# Patient Record
Sex: Male | Born: 1950 | Race: Black or African American | Hispanic: No | Marital: Married | Smoking: Never smoker
Health system: Southern US, Community
[De-identification: ages and names within clinical notes are randomized; demographics above are authoritative.]

## PROBLEM LIST (undated history)

## (undated) DIAGNOSIS — T148XXA Other injury of unspecified body region, initial encounter: Secondary | ICD-10-CM

## (undated) DIAGNOSIS — R519 Headache, unspecified: Secondary | ICD-10-CM

## (undated) DIAGNOSIS — Z8546 Personal history of malignant neoplasm of prostate: Secondary | ICD-10-CM

## (undated) DIAGNOSIS — Z860100 Personal history of colon polyps, unspecified: Secondary | ICD-10-CM

## (undated) DIAGNOSIS — J189 Pneumonia, unspecified organism: Secondary | ICD-10-CM

## (undated) DIAGNOSIS — R0600 Dyspnea, unspecified: Secondary | ICD-10-CM

## (undated) DIAGNOSIS — N529 Male erectile dysfunction, unspecified: Secondary | ICD-10-CM

## (undated) DIAGNOSIS — C801 Malignant (primary) neoplasm, unspecified: Secondary | ICD-10-CM

## (undated) DIAGNOSIS — R011 Cardiac murmur, unspecified: Secondary | ICD-10-CM

## (undated) DIAGNOSIS — H269 Unspecified cataract: Secondary | ICD-10-CM

## (undated) HISTORY — DX: Malignant (primary) neoplasm, unspecified: C80.1

## (undated) HISTORY — DX: Unspecified cataract: H26.9

## (undated) HISTORY — DX: Personal history of malignant neoplasm of prostate: Z85.46

## (undated) HISTORY — DX: Other injury of unspecified body region, initial encounter: T14.8XXA

## (undated) HISTORY — DX: Personal history of colonic polyps: Z86.010

## (undated) HISTORY — DX: Male erectile dysfunction, unspecified: N52.9

## (undated) HISTORY — DX: Personal history of colon polyps, unspecified: Z86.0100

## (undated) HISTORY — PX: RETINAL TEAR REPAIR CRYOTHERAPY: SHX5304

## (undated) HISTORY — DX: Cardiac murmur, unspecified: R01.1

## (undated) HISTORY — PX: CATARACT EXTRACTION W/ INTRAOCULAR LENS IMPLANT: SHX1309

---
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
## 1994-01-06 HISTORY — PX: PROSTATECTOMY: SHX69

## 1997-01-06 HISTORY — PX: HEMORRHOID SURGERY: SHX153

## 1999-01-07 HISTORY — PX: ANKLE FRACTURE SURGERY: SHX122

## 2003-01-07 HISTORY — PX: PROSTATECTOMY: SHX69

## 2007-03-09 LAB — HM COLONOSCOPY

## 2007-06-30 ENCOUNTER — Encounter: Payer: Self-pay | Admitting: Family Medicine

## 2008-04-10 ENCOUNTER — Ambulatory Visit: Payer: Self-pay | Admitting: Family Medicine

## 2008-04-10 DIAGNOSIS — F528 Other sexual dysfunction not due to a substance or known physiological condition: Secondary | ICD-10-CM | POA: Insufficient documentation

## 2008-04-10 DIAGNOSIS — L909 Atrophic disorder of skin, unspecified: Secondary | ICD-10-CM | POA: Insufficient documentation

## 2008-04-10 DIAGNOSIS — Z860100 Personal history of colon polyps, unspecified: Secondary | ICD-10-CM | POA: Insufficient documentation

## 2008-04-10 DIAGNOSIS — Z8546 Personal history of malignant neoplasm of prostate: Secondary | ICD-10-CM | POA: Insufficient documentation

## 2008-04-10 DIAGNOSIS — J45909 Unspecified asthma, uncomplicated: Secondary | ICD-10-CM | POA: Insufficient documentation

## 2008-04-10 DIAGNOSIS — M659 Synovitis and tenosynovitis, unspecified: Secondary | ICD-10-CM

## 2008-04-10 DIAGNOSIS — L919 Hypertrophic disorder of the skin, unspecified: Secondary | ICD-10-CM

## 2008-07-03 ENCOUNTER — Ambulatory Visit: Payer: Self-pay | Admitting: Family Medicine

## 2008-07-03 DIAGNOSIS — M109 Gout, unspecified: Secondary | ICD-10-CM | POA: Insufficient documentation

## 2008-07-06 LAB — CONVERTED CEMR LAB: Uric Acid, Serum: 5.2 mg/dL (ref 4.0–7.8)

## 2009-04-12 ENCOUNTER — Ambulatory Visit: Payer: Self-pay | Admitting: Family Medicine

## 2009-04-12 LAB — CONVERTED CEMR LAB
Bilirubin Urine: NEGATIVE
Blood in Urine, dipstick: NEGATIVE
Glucose, Urine, Semiquant: NEGATIVE
Ketones, urine, test strip: NEGATIVE
Nitrite: NEGATIVE
Protein, U semiquant: NEGATIVE
Specific Gravity, Urine: 1.01
Urobilinogen, UA: 0.2
WBC Urine, dipstick: NEGATIVE
pH: 6

## 2009-04-17 LAB — CONVERTED CEMR LAB
Alkaline Phosphatase: 70 units/L (ref 39–117)
Basophils Absolute: 0 10*3/uL (ref 0.0–0.1)
Basophils Relative: 0.6 % (ref 0.0–3.0)
Bilirubin, Direct: 0 mg/dL (ref 0.0–0.3)
CO2: 30 meq/L (ref 19–32)
Calcium: 9.3 mg/dL (ref 8.4–10.5)
Creatinine, Ser: 0.8 mg/dL (ref 0.4–1.5)
Eosinophils Absolute: 0.1 10*3/uL (ref 0.0–0.7)
GFR calc non Af Amer: 105.29 mL/min (ref >60)
HDL: 66.4 mg/dL (ref >39.00)
Lymphocytes Relative: 58 % — ABNORMAL HIGH (ref 12.0–46.0)
MCHC: 34.8 g/dL (ref 30.0–36.0)
Monocytes Relative: 10.7 % (ref 3.0–12.0)
Neutrophils Relative %: 28 % — ABNORMAL LOW (ref 43.0–77.0)
RBC: 4.7 M/uL (ref 4.22–5.81)
RDW: 13.8 % (ref 11.5–14.6)
Total CHOL/HDL Ratio: 3
Triglycerides: 45 mg/dL (ref 0.0–149.0)
VLDL: 9 mg/dL (ref 0.0–40.0)

## 2009-04-19 ENCOUNTER — Ambulatory Visit: Payer: Self-pay | Admitting: Family Medicine

## 2009-11-17 ENCOUNTER — Encounter: Payer: Self-pay | Admitting: Family Medicine

## 2009-11-19 ENCOUNTER — Encounter: Payer: Self-pay | Admitting: Family Medicine

## 2010-02-05 NOTE — Miscellaneous (Signed)
Summary: Flu Shot/[facility]  Flu Shot/[facility]   Imported By: Maryln Gottron 11/21/2009 15:39:25  _____________________________________________________________________  External Attachment:    Type:   Image     Comment:   External Document

## 2010-02-05 NOTE — Assessment & Plan Note (Signed)
Summary: CPX/[facility]   Vital Signs:  Patient profile:   60 year old [name] Height:      68 inches Weight:      186 pounds BMI:     28.38 BP sitting:   116 / 84  (left arm) Cuff size:   regular  Vitals Entered By: Raechel Ache, RN (April 19, 2009 9:23 AM) CC: CPX, labs done. Hemorrhoids worse. C/o sore L rib. Toe and foot fungus. Is Patient Diabetic? No   History of Present Illness: 60 yr old [name] for a cpx. He feels fine in general but does ask me to look at his feet. He has a long hx of toenail fungus, and he wants to treat this. The nails dig into his toes and cause pain. He saw Dr. Brunilda Payor for a prostate check last September.   Allergies: 1)  ! Cipro  Past History:  Past Medical History: Asthma Colonic polyps, hx of Prostate cancer, hx of. Sees Dr. Brunilda Payor fractured right ankle ED hemorrhoids  Past Surgical History: Rt bunion removed 1965 Prostatectomy 2005 colonoscopy 2009 in [place], several polyps were removed. repeat in 3 years Hemorrhoidectomy  Family History: Reviewed history from 04/10/2008 and no changes required. Family History of Alcoholism/Addiction Family History of Arthritis Family History Breast cancer 1st degree relative <50 Family History of Colon CA 1st degree relative <60 Family History Ovarian cancer Family History of Prostate CA 1st degree relative <50 Family History of Stroke M 1st degree relative <50  Social History: Reviewed history from 04/10/2008 and no changes required. Married Never Smoked Alcohol use-yes Drug use-no Regular exercise-yes  Review of Systems  The patient denies anorexia, fever, weight loss, weight gain, vision loss, decreased hearing, hoarseness, chest pain, syncope, dyspnea on exertion, peripheral edema, prolonged cough, headaches, hemoptysis, abdominal pain, melena, hematochezia, severe indigestion/heartburn, hematuria, incontinence, genital sores, muscle weakness, suspicious skin lesions, transient blindness, difficulty  walking, depression, unusual weight change, abnormal bleeding, enlarged lymph nodes, angioedema, breast masses, and testicular masses.    Physical Exam  General:  Well-developed,well-nourished,in no acute distress; alert,appropriate and cooperative throughout examination Head:  Normocephalic and atraumatic without obvious abnormalities. No apparent alopecia or balding. Eyes:  No corneal or conjunctival inflammation noted. EOMI. Perrla. Funduscopic exam benign, without hemorrhages, exudates or papilledema. Vision grossly normal. Ears:  External ear exam shows no significant lesions or deformities.  Otoscopic examination reveals clear canals, tympanic membranes are intact bilaterally without bulging, retraction, inflammation or discharge. Hearing is grossly normal bilaterally. Nose:  External nasal examination shows no deformity or inflammation. Nasal mucosa are pink and moist without lesions or exudates. Mouth:  Oral mucosa and oropharynx without lesions or exudates.  Teeth in good repair. Neck:  No deformities, masses, or tenderness noted. Chest Wall:  No deformities, masses, tenderness or gynecomastia noted. Lungs:  Normal respiratory effort, chest expands symmetrically. Lungs are clear to auscultation, no crackles or wheezes. Heart:  Normal rate and regular rhythm. S1 and S2 normal without gallop, murmur, click, rub or other extra sounds. EKG normal Abdomen:  Bowel sounds positive,abdomen soft and non-tender without masses, organomegaly or hernias noted. Msk:  No deformity or scoliosis noted of thoracic or lumbar spine.   Pulses:  R and L carotid,radial,femoral,dorsalis pedis and posterior tibial pulses are full and equal bilaterally Extremities:  No clubbing, cyanosis, edema, or deformity noted with normal full range of motion of all joints.   Neurologic:  No cranial nerve deficits noted. Station and gait are normal. Plantar reflexes are down-going bilaterally. DTRs are  symmetrical throughout.  Sensory, motor and coordinative functions appear intact. Skin:  Intact without suspicious lesions or rashes. Most of his toenails are thick and scaly Cervical Nodes:  No lymphadenopathy noted Axillary Nodes:  No palpable lymphadenopathy Inguinal Nodes:  No significant adenopathy Psych:  Cognition and judgment appear intact. Alert and cooperative with normal attention span and concentration. No apparent delusions, illusions, hallucinations   Impression & Recommendations:  Problem # 1:  HEALTH MAINTENANCE EXAM (ICD-V70.0)  Orders: EKG w/ Interpretation (93000)  Complete Medication List: 1)  Aspirin 81 Mg Tbec (Aspirin) .... One by mouth every day 2)  Vitamin B Complex-c Caps (B complex-c) .... Once daily 3)  Vitamin D 1000 Unit Tabs (Cholecalciferol) .... Once daily 4)  Vitamin E 600 Unit Caps (Vitamin e) .... Once daily 5)  Vitamin C 500 Mg Tabs (Ascorbic acid) .... Once daily 6)  Androgel Pump 1 % Gel (Testosterone) .Marland Kitchen.. 10g every day 7)  Lac-hydrin 12 % Crea (Ammonium lactate) .... Apply once daily 8)  Viagra 100 Mg Tabs (Sildenafil citrate) .... As needed 9)  Lamisil 250 Mg Tabs (Terbinafine hcl) .... Once daily  Patient Instructions: 1)  Please schedule a follow-up appointment in 1 year.  Prescriptions: LAMISIL 250 MG TABS (TERBINAFINE HCL) once daily  #90 x 0   Entered and Authorized by:   Nelwyn Salisbury MD   Signed by:   Nelwyn Salisbury MD on 04/19/2009   Method used:   Print then Give to Patient   RxID:   [id number] VIAGRA 100 MG TABS (SILDENAFIL CITRATE) as needed  #30 x 3   Entered and Authorized by:   Nelwyn Salisbury MD   Signed by:   Nelwyn Salisbury MD on 04/19/2009   Method used:   Print then Give to Patient   RxID:   [id number] LAC-HYDRIN 12 % CREA (AMMONIUM LACTATE) apply once daily  #90 x 3   Entered and Authorized by:   Nelwyn Salisbury MD   Signed by:   Nelwyn Salisbury MD on 04/19/2009   Method used:   Print then Give to Patient   RxID:    [id number]    Preventive Care Screening  Colonoscopy:    Date:  03/09/2007    Results:  Adenomatous Polyp

## 2010-02-05 NOTE — Miscellaneous (Signed)
Summary: flu inj given at [facility].   Clinical Lists Changes  Observations: Added new observation of FLU VAX: Historical (11/17/2009 10:58)      Immunization History:  Influenza Immunization History:    Influenza:  historical (11/17/2009) pt receivced flu inj a [facility] [address] lot number [id number]...................gh rn.

## 2010-06-19 ENCOUNTER — Other Ambulatory Visit: Payer: Self-pay | Admitting: Family Medicine

## 2010-09-23 ENCOUNTER — Other Ambulatory Visit (INDEPENDENT_AMBULATORY_CARE_PROVIDER_SITE_OTHER): Payer: BC Managed Care – PPO

## 2010-09-23 DIAGNOSIS — Z0000 Encounter for general adult medical examination without abnormal findings: Secondary | ICD-10-CM

## 2010-09-23 LAB — POCT URINALYSIS DIPSTICK
Ketones, UA: NEGATIVE
Leukocytes, UA: NEGATIVE
Nitrite, UA: NEGATIVE
Protein, UA: NEGATIVE
Urobilinogen, UA: 0.2

## 2010-09-23 LAB — CBC WITH DIFFERENTIAL/PLATELET
Basophils Absolute: 0 10*3/uL (ref 0.0–0.1)
Basophils Relative: 0.4 % (ref 0.0–3.0)
Hemoglobin: 14.5 g/dL (ref 13.0–17.0)
Lymphocytes Relative: 52.7 % — ABNORMAL HIGH (ref 12.0–46.0)
Monocytes Relative: 8.4 % (ref 3.0–12.0)
Neutro Abs: 1.2 10*3/uL — ABNORMAL LOW (ref 1.4–7.7)
RBC: 4.66 Mil/uL (ref 4.22–5.81)
WBC: 3.3 10*3/uL — ABNORMAL LOW (ref 4.5–10.5)

## 2010-09-23 LAB — LIPID PANEL
Cholesterol: 178 mg/dL (ref 0–200)
LDL Cholesterol: 106 mg/dL — ABNORMAL HIGH (ref 0–99)
Triglycerides: 41 mg/dL (ref 0.0–149.0)
VLDL: 8.2 mg/dL (ref 0.0–40.0)

## 2010-09-23 LAB — HEPATIC FUNCTION PANEL
Albumin: 4.2 g/dL (ref 3.5–5.2)
Total Protein: 6.9 g/dL (ref 6.0–8.3)

## 2010-09-23 LAB — BASIC METABOLIC PANEL
CO2: 29 mEq/L (ref 19–32)
Chloride: 104 mEq/L (ref 96–112)
Creatinine, Ser: 0.9 mg/dL (ref 0.4–1.5)

## 2010-09-25 NOTE — Progress Notes (Signed)
Quick Note:  Left a message for pt to return call. ______ 

## 2010-09-26 ENCOUNTER — Telehealth: Payer: Self-pay | Admitting: Family Medicine

## 2010-09-26 NOTE — Telephone Encounter (Signed)
Spoke with pt and gave results. 

## 2010-09-26 NOTE — Telephone Encounter (Signed)
Message copied by Baldemar Friday on Thu Sep 26, 2010  3:15 PM ------      Message from: Gershon Crane A      Created: Wed Sep 25, 2010  1:19 PM       normal

## 2010-09-27 ENCOUNTER — Encounter: Payer: Self-pay | Admitting: Family Medicine

## 2010-09-30 ENCOUNTER — Encounter: Payer: Self-pay | Admitting: Family Medicine

## 2010-09-30 ENCOUNTER — Ambulatory Visit (INDEPENDENT_AMBULATORY_CARE_PROVIDER_SITE_OTHER): Payer: BC Managed Care – PPO | Admitting: Family Medicine

## 2010-09-30 VITALS — BP 120/78 | HR 57 | Temp 98.4°F | Ht 69.25 in | Wt 186.0 lb

## 2010-09-30 DIAGNOSIS — Z0000 Encounter for general adult medical examination without abnormal findings: Secondary | ICD-10-CM

## 2010-09-30 DIAGNOSIS — K635 Polyp of colon: Secondary | ICD-10-CM

## 2010-09-30 DIAGNOSIS — D126 Benign neoplasm of colon, unspecified: Secondary | ICD-10-CM

## 2010-09-30 MED ORDER — TERBINAFINE HCL 250 MG PO TABS: 250.0000 mg | ORAL_TABLET | Freq: Every day | ORAL | Status: DC

## 2010-09-30 MED ORDER — TADALAFIL 10 MG PO TABS: 10.0000 mg | ORAL_TABLET | ORAL | Status: DC | PRN

## 2010-09-30 NOTE — Progress Notes (Signed)
  Subjective:    Patient ID: Matthew [name], male    DOB: 03/29/1950, 60 y.o.   MRN: [id number]  HPI 60 yr old male for a cpx. He feels great. He is active, he works out 5 days a week, and plays golf and racquetball. We used 90 days of Lamisil last year for toenail fungus, and this was very helpful. He still has some fungus left, however, and he wants to use it again.    Review of Systems  Constitutional: Negative.   HENT: Negative.   Eyes: Negative.   Respiratory: Negative.   Cardiovascular: Negative.   Gastrointestinal: Negative.   Genitourinary: Negative.   Musculoskeletal: Negative.   Skin: Negative.   Neurological: Negative.   Hematological: Negative.   Psychiatric/Behavioral: Negative.        Objective:   Physical Exam  Constitutional: He is oriented to person, place, and time. He appears well-developed and well-nourished. No distress.  HENT:  Head: Normocephalic and atraumatic.  Right Ear: External ear normal.  Left Ear: External ear normal.  Nose: Nose normal.  Mouth/Throat: Oropharynx is clear and moist. No oropharyngeal exudate.  Eyes: Conjunctivae and EOM are normal. Pupils are equal, round, and reactive to light. Right eye exhibits no discharge. Left eye exhibits no discharge. No scleral icterus.  Neck: Neck supple. No JVD present. No tracheal deviation present. No thyromegaly present.  Cardiovascular: Normal rate, regular rhythm, normal heart sounds and intact distal pulses.  Exam reveals no gallop and no friction rub.   No murmur heard.      EKG normal   Pulmonary/Chest: Effort normal and breath sounds normal. No respiratory distress. He has no wheezes. He has no rales. He exhibits no tenderness.  Abdominal: Soft. Bowel sounds are normal. He exhibits no distension and no mass. There is no tenderness. There is no rebound and no guarding.  Genitourinary: Rectum normal, prostate normal and penis normal. Guaiac negative stool. No penile tenderness.  Musculoskeletal:  Normal range of motion. He exhibits no edema and no tenderness.  Lymphadenopathy:    He has no cervical adenopathy.  Neurological: He is alert and oriented to person, place, and time. He has normal reflexes. No cranial nerve deficit. He exhibits normal muscle tone. Coordination normal.  Skin: Skin is warm and dry. No rash noted. He is not diaphoretic. No erythema. No pallor.  Psychiatric: He has a normal mood and affect. His behavior is normal. Judgment and thought content normal.          Assessment & Plan:  Use another 90 days of Lamisil. Switch to Cialis to use prn . Set up another colonoscopy.

## 2010-10-14 ENCOUNTER — Telehealth: Payer: Self-pay | Admitting: *Deleted

## 2010-10-14 ENCOUNTER — Encounter: Payer: Self-pay | Admitting: Internal Medicine

## 2010-10-14 ENCOUNTER — Ambulatory Visit (AMBULATORY_SURGERY_CENTER): Payer: BC Managed Care – PPO | Admitting: *Deleted

## 2010-10-14 VITALS — Ht 68.0 in | Wt 188.4 lb

## 2010-10-14 DIAGNOSIS — Z1211 Encounter for screening for malignant neoplasm of colon: Secondary | ICD-10-CM

## 2010-10-14 MED ORDER — PEG-KCL-NACL-NASULF-NA ASC-C 100 G PO SOLR: ORAL | Status: DC

## 2010-10-14 NOTE — Telephone Encounter (Signed)
Dr Rhea Belton- This pt is new to [facility].  Pt is scheduled for recall colon for history adenomatous polyps.  Last colonoscopy 2009 in [place].  I am putting path report on your desk for review.  Thanks, Olegario Messier in previsit.

## 2010-10-23 NOTE — Telephone Encounter (Signed)
Matthew [name] The path report showed adenoma, which means he will need sooner than the 10 yr f/u. I do not know his other polyp history, but we can proceed with repeat colon for surveillance. Thanks.

## 2010-10-28 ENCOUNTER — Other Ambulatory Visit: Payer: BC Managed Care – PPO | Admitting: Internal Medicine

## 2010-11-04 ENCOUNTER — Encounter: Payer: Self-pay | Admitting: Internal Medicine

## 2010-11-04 ENCOUNTER — Ambulatory Visit (AMBULATORY_SURGERY_CENTER): Payer: BC Managed Care – PPO | Admitting: Internal Medicine

## 2010-11-04 VITALS — BP 145/97 | HR 57 | Temp 96.6°F | Resp 18 | Ht 68.0 in | Wt 188.0 lb

## 2010-11-04 DIAGNOSIS — D126 Benign neoplasm of colon, unspecified: Secondary | ICD-10-CM

## 2010-11-04 DIAGNOSIS — Z1211 Encounter for screening for malignant neoplasm of colon: Secondary | ICD-10-CM

## 2010-11-04 DIAGNOSIS — Z86010 Personal history of colonic polyps: Secondary | ICD-10-CM

## 2010-11-04 MED ORDER — SODIUM CHLORIDE 0.9 % IV SOLN: 500.0000 mL | INTRAVENOUS | Status: DC

## 2010-11-04 NOTE — Progress Notes (Signed)
No complaints noted in the recovery room. maw 

## 2010-11-04 NOTE — Patient Instructions (Signed)
See the picture page for your findings from your exam today.  Follow the green and blue discharge instruction sheets the rest of the day.  Resume your prior medications today. Please call if any questions or concerns.  

## 2010-11-05 ENCOUNTER — Telehealth: Payer: Self-pay | Admitting: *Deleted

## 2010-11-05 NOTE — Telephone Encounter (Signed)

## 2010-11-10 ENCOUNTER — Encounter: Payer: Self-pay | Admitting: Internal Medicine

## 2011-11-11 ENCOUNTER — Ambulatory Visit (INDEPENDENT_AMBULATORY_CARE_PROVIDER_SITE_OTHER): Payer: BC Managed Care – PPO

## 2011-11-11 DIAGNOSIS — Z23 Encounter for immunization: Secondary | ICD-10-CM

## 2012-02-13 ENCOUNTER — Other Ambulatory Visit (INDEPENDENT_AMBULATORY_CARE_PROVIDER_SITE_OTHER): Payer: BC Managed Care – PPO

## 2012-02-13 DIAGNOSIS — Z0000 Encounter for general adult medical examination without abnormal findings: Secondary | ICD-10-CM

## 2012-02-13 LAB — LIPID PANEL
HDL: 57.5 mg/dL (ref >39.00)
Triglycerides: 81 mg/dL (ref 0.0–149.0)
VLDL: 16.2 mg/dL (ref 0.0–40.0)

## 2012-02-13 LAB — POCT URINALYSIS DIPSTICK
Blood, UA: NEGATIVE
Ketones, UA: NEGATIVE
Leukocytes, UA: NEGATIVE
Protein, UA: NEGATIVE
pH, UA: 7.5

## 2012-02-13 LAB — BASIC METABOLIC PANEL
CO2: 31 mEq/L (ref 19–32)
GFR: 102.8 mL/min (ref >60.00)
Glucose, Bld: 87 mg/dL (ref 70–99)
Potassium: 4.2 mEq/L (ref 3.5–5.1)
Sodium: 139 mEq/L (ref 135–145)

## 2012-02-13 LAB — CBC WITH DIFFERENTIAL/PLATELET
Eosinophils Relative: 2.6 % (ref 0.0–5.0)
Monocytes Absolute: 0.4 10*3/uL (ref 0.1–1.0)
Monocytes Relative: 9.5 % (ref 3.0–12.0)
Neutrophils Relative %: 37.1 % — ABNORMAL LOW (ref 43.0–77.0)
Platelets: 210 10*3/uL (ref 150.0–400.0)
WBC: 4 10*3/uL — ABNORMAL LOW (ref 4.5–10.5)

## 2012-02-13 LAB — HEPATIC FUNCTION PANEL
ALT: 12 U/L (ref 0–53)
AST: 18 U/L (ref 0–37)
Albumin: 4.3 g/dL (ref 3.5–5.2)
Alkaline Phosphatase: 83 U/L (ref 39–117)
Total Protein: 7 g/dL (ref 6.0–8.3)

## 2012-02-16 ENCOUNTER — Other Ambulatory Visit: Payer: BC Managed Care – PPO

## 2012-02-17 NOTE — Progress Notes (Signed)
Quick Note:  Pt has CPE on 02/23/12 will go over then. ______

## 2012-02-23 ENCOUNTER — Encounter: Payer: Self-pay | Admitting: Family Medicine

## 2012-02-23 ENCOUNTER — Ambulatory Visit (INDEPENDENT_AMBULATORY_CARE_PROVIDER_SITE_OTHER): Payer: BC Managed Care – PPO | Admitting: Family Medicine

## 2012-02-23 VITALS — BP 124/80 | HR 65 | Temp 97.7°F | Ht 68.5 in | Wt 196.0 lb

## 2012-02-23 DIAGNOSIS — Z0000 Encounter for general adult medical examination without abnormal findings: Secondary | ICD-10-CM

## 2012-02-23 MED ORDER — SILDENAFIL CITRATE 100 MG PO TABS: 100.0000 mg | ORAL_TABLET | ORAL | Status: DC | PRN

## 2012-02-23 NOTE — Progress Notes (Signed)
  Subjective:    Patient ID: Matthew [name], male    DOB: [birth year], 62 y.o.   MRN: [id number]  HPI    Review of Systems     Objective:   Physical Exam        Assessment & Plan:

## 2012-02-23 NOTE — Progress Notes (Signed)
  Subjective:    Patient ID: Matthew [name], male    DOB: [birth year], 62 y.o.   MRN: [id number]  HPI 62 yr old male for a cpx. He feels fine and has no concerns. He would like switch back to Viagra.    Review of Systems  Constitutional: Negative.   HENT: Negative.   Eyes: Negative.   Respiratory: Negative.   Cardiovascular: Negative.   Gastrointestinal: Negative.   Genitourinary: Negative.   Musculoskeletal: Negative.   Skin: Negative.   Neurological: Negative.   Psychiatric/Behavioral: Negative.        Objective:   Physical Exam  Constitutional: He is oriented to person, place, and time. He appears well-developed and well-nourished. No distress.  HENT:  Head: Normocephalic and atraumatic.  Right Ear: External ear normal.  Left Ear: External ear normal.  Nose: Nose normal.  Mouth/Throat: Oropharynx is clear and moist. No oropharyngeal exudate.  Eyes: Conjunctivae and EOM are normal. Pupils are equal, round, and reactive to light. Right eye exhibits no discharge. Left eye exhibits no discharge. No scleral icterus.  Neck: Neck supple. No JVD present. No tracheal deviation present. No thyromegaly present.  Cardiovascular: Normal rate, regular rhythm, normal heart sounds and intact distal pulses.  Exam reveals no gallop and no friction rub.   No murmur heard. EKG normal   Pulmonary/Chest: Effort normal and breath sounds normal. No respiratory distress. He has no wheezes. He has no rales. He exhibits no tenderness.  Abdominal: Soft. Bowel sounds are normal. He exhibits no distension and no mass. There is no tenderness. There is no rebound and no guarding.  Genitourinary: Rectum normal, prostate normal and penis normal. Guaiac negative stool. No penile tenderness.  Musculoskeletal: Normal range of motion. He exhibits no edema and no tenderness.  Lymphadenopathy:    He has no cervical adenopathy.  Neurological: He is alert and oriented to person, place, and time. He has normal  reflexes. No cranial nerve deficit. He exhibits normal muscle tone. Coordination normal.  Skin: Skin is warm and dry. No rash noted. He is not diaphoretic. No erythema. No pallor.  Psychiatric: He has a normal mood and affect. His behavior is normal. Judgment and thought content normal.          Assessment & Plan:  Well exam.

## 2012-10-26 ENCOUNTER — Ambulatory Visit (INDEPENDENT_AMBULATORY_CARE_PROVIDER_SITE_OTHER): Payer: BC Managed Care – PPO

## 2012-10-26 DIAGNOSIS — Z23 Encounter for immunization: Secondary | ICD-10-CM

## 2013-01-12 ENCOUNTER — Ambulatory Visit (INDEPENDENT_AMBULATORY_CARE_PROVIDER_SITE_OTHER): Payer: BC Managed Care – PPO | Admitting: Family Medicine

## 2013-01-12 ENCOUNTER — Encounter: Payer: Self-pay | Admitting: Family Medicine

## 2013-01-12 VITALS — BP 130/86 | HR 75 | Temp 98.7°F | Wt 181.0 lb

## 2013-01-12 DIAGNOSIS — M545 Low back pain, unspecified: Secondary | ICD-10-CM

## 2013-01-12 MED ORDER — METHYLPREDNISOLONE 4 MG PO KIT: PACK | ORAL | Status: AC

## 2013-01-12 MED ORDER — CYCLOBENZAPRINE HCL 10 MG PO TABS: 10.0000 mg | ORAL_TABLET | Freq: Three times a day (TID) | ORAL | Status: DC | PRN

## 2013-01-12 NOTE — Progress Notes (Signed)
   Subjective:    Patient ID: Matthew [name], male    DOB: 1950/02/03, 63 y.o.   MRN: [id number]  HPI Here for 10 days of spasm and pain in the lower back, especially on the left side. This started after he spent a day taking down his Christmas lights. He has tried heat and Aleve with mixed results.    Review of Systems  Constitutional: Negative.   Musculoskeletal: Positive for back pain.       Objective:   Physical Exam  Constitutional: He appears well-developed and well-nourished.  Musculoskeletal:  Lots of spasm in the middle and lower back with reduced ROM. Tender on the left lower back           Assessment & Plan:  Probable herniated disc. Continue heat and stretches. Try a Medrol dose pack and Flexeril. Recheck if not better by next week .

## 2013-01-12 NOTE — Progress Notes (Signed)
Pre visit review using our clinic review tool, if applicable. No additional management support is needed unless otherwise documented below in the visit note. 

## 2013-09-16 ENCOUNTER — Encounter: Payer: Self-pay | Admitting: Internal Medicine

## 2013-09-22 ENCOUNTER — Encounter: Payer: Self-pay | Admitting: Internal Medicine

## 2013-11-01 ENCOUNTER — Encounter: Payer: Self-pay | Admitting: Family Medicine

## 2013-11-01 ENCOUNTER — Ambulatory Visit (INDEPENDENT_AMBULATORY_CARE_PROVIDER_SITE_OTHER): Payer: BC Managed Care – PPO | Admitting: Family Medicine

## 2013-11-01 VITALS — BP 145/82 | HR 65 | Temp 98.0°F | Ht 68.5 in | Wt 184.0 lb

## 2013-11-01 DIAGNOSIS — M722 Plantar fascial fibromatosis: Secondary | ICD-10-CM

## 2013-11-01 DIAGNOSIS — Z23 Encounter for immunization: Secondary | ICD-10-CM

## 2013-11-01 NOTE — Progress Notes (Signed)
Pre visit review using our clinic review tool, if applicable. No additional management support is needed unless otherwise documented below in the visit note. 

## 2013-11-01 NOTE — Progress Notes (Signed)
   Subjective:    Patient ID: Matthew [name], male    DOB: 1950/10/07, 63 y.o.   MRN: [id number]  HPI Here for 3 months of pain in the bottom of the left foot. No hx of trauma. He is active and plays racquetball several days a week. Using Advil prn. He has been wearing OTC arch supports.    Review of Systems  Constitutional: Negative.   Musculoskeletal: Positive for gait problem and myalgias. Negative for joint swelling.       Objective:   Physical Exam  Constitutional: He appears well-developed and well-nourished.  Musculoskeletal:  The left foot appears normal but he is quite tender over the inferior surface of the heel and along the medial arch          Assessment & Plan:  Plantar fasciitis. Refer to Podiatry.

## 2013-11-01 NOTE — Addendum Note (Signed)
Addended by: Aggie Hacker A on: 11/01/2013 09:30 AM   Modules accepted: Orders

## 2013-11-04 ENCOUNTER — Telehealth: Payer: Self-pay | Admitting: Family Medicine

## 2013-11-04 NOTE — Telephone Encounter (Signed)
Pt needs refill on viargra 100 mg #30 with refills  for [facility] supply [facility]

## 2013-11-07 ENCOUNTER — Encounter (INDEPENDENT_AMBULATORY_CARE_PROVIDER_SITE_OTHER): Payer: BC Managed Care – PPO | Admitting: Ophthalmology

## 2013-11-07 ENCOUNTER — Ambulatory Visit: Payer: BC Managed Care – PPO | Admitting: Podiatry

## 2013-11-07 DIAGNOSIS — H2513 Age-related nuclear cataract, bilateral: Secondary | ICD-10-CM

## 2013-11-07 DIAGNOSIS — H43813 Vitreous degeneration, bilateral: Secondary | ICD-10-CM

## 2013-11-07 DIAGNOSIS — H35371 Puckering of macula, right eye: Secondary | ICD-10-CM

## 2013-11-07 DIAGNOSIS — H33303 Unspecified retinal break, bilateral: Secondary | ICD-10-CM

## 2013-11-07 HISTORY — PX: REFRACTIVE SURGERY: SHX103

## 2013-11-07 MED ORDER — SILDENAFIL CITRATE 100 MG PO TABS: 100.0000 mg | ORAL_TABLET | ORAL | Status: DC | PRN

## 2013-11-07 NOTE — Telephone Encounter (Signed)
done

## 2013-11-08 ENCOUNTER — Ambulatory Visit (AMBULATORY_SURGERY_CENTER): Payer: Self-pay | Admitting: *Deleted

## 2013-11-08 VITALS — Ht 68.0 in | Wt 187.0 lb

## 2013-11-08 DIAGNOSIS — Z86010 Personal history of colonic polyps: Secondary | ICD-10-CM

## 2013-11-08 MED ORDER — MOVIPREP 100 G PO SOLR: ORAL | Status: DC

## 2013-11-08 NOTE — Progress Notes (Signed)
No allergies to eggs or soy. No problems with anesthesia.  Pt given Emmi instructions for colonoscopy  No oxygen use  No diet drug use  

## 2013-11-10 ENCOUNTER — Encounter: Payer: Self-pay | Admitting: Internal Medicine

## 2013-11-10 ENCOUNTER — Ambulatory Visit: Payer: BC Managed Care – PPO | Admitting: Podiatry

## 2013-11-14 ENCOUNTER — Ambulatory Visit (INDEPENDENT_AMBULATORY_CARE_PROVIDER_SITE_OTHER): Payer: BC Managed Care – PPO | Admitting: Podiatry

## 2013-11-14 ENCOUNTER — Encounter: Payer: Self-pay | Admitting: Podiatry

## 2013-11-14 ENCOUNTER — Ambulatory Visit (INDEPENDENT_AMBULATORY_CARE_PROVIDER_SITE_OTHER): Payer: BC Managed Care – PPO

## 2013-11-14 ENCOUNTER — Encounter (INDEPENDENT_AMBULATORY_CARE_PROVIDER_SITE_OTHER): Payer: BC Managed Care – PPO | Admitting: Ophthalmology

## 2013-11-14 VITALS — BP 131/80 | HR 65 | Resp 16 | Ht 68.0 in | Wt 186.0 lb

## 2013-11-14 DIAGNOSIS — M722 Plantar fascial fibromatosis: Secondary | ICD-10-CM

## 2013-11-14 DIAGNOSIS — H33302 Unspecified retinal break, left eye: Secondary | ICD-10-CM

## 2013-11-14 MED ORDER — TRIAMCINOLONE ACETONIDE 10 MG/ML IJ SUSP
10.0000 mg | Freq: Once | INTRAMUSCULAR | Status: AC
  Administered 2013-11-14: 10 mg

## 2013-11-14 NOTE — Progress Notes (Signed)
   Subjective:    Patient ID: Matthew [name], male    DOB: 1950/09/12, 63 y.o.   MRN: [id number]  HPI Comments: "I have pain in this heel and the side"  Patient c/o aching plantar, posterior and medial left foot for 4-5 months. He is active in racquetball (2-3 x wkly). He notices more pain after days of playing. He's tried different shoes. He has worn Good Feet inserts for couple years.     Review of Systems  HENT: Positive for tinnitus.   All other systems reviewed and are negative.      Objective:   Physical Exam        Assessment & Plan:

## 2013-11-14 NOTE — Patient Instructions (Signed)

## 2013-11-15 NOTE — Progress Notes (Signed)
Subjective:     Patient ID: Matthew [name], male   DOB: [birth year], 63 y.o.   MRN: [id number]  HPIpatient presents stating I'm getting a lot of pain in my left heel for about 5 months. Patient has tried to be active but is having trouble playing racquetball and states he's worn good feet insoles which are not helping   Review of Systems  All other systems reviewed and are negative.      Objective:   Physical Exam  Constitutional: He is oriented to person, place, and time.  Cardiovascular: Intact distal pulses.   Musculoskeletal: Normal range of motion.  Neurological: He is oriented to person, place, and time.  Skin: Skin is warm.  Nursing note and vitals reviewed. neurovascular status intact with muscle strength adequate and range of motion subtalar midtarsal joint within normal limits. Patient is noted to have severe discomfort plantar aspect left heel at the insertion of the tendon into the calcaneus with fluid buildup at the insertional point. Digits are well-perfused well oriented 3 with no equinus condition noted     Assessment:     Plantar fasciitis of the left heel insertional in nature with structural deformity noted    Plan:     H&P and condition discussed and reviewed condition. Injected the left plantar fashion 3 mg Kenalog 5 mg Xylocaine and instructed on physical therapy anti-inflammatories and long-term orthotics. Placed on diclofenac 75 mg twice a day

## 2013-11-16 ENCOUNTER — Telehealth: Payer: Self-pay | Admitting: *Deleted

## 2013-11-16 MED ORDER — DICLOFENAC SODIUM 75 MG PO TBEC: 75.0000 mg | DELAYED_RELEASE_TABLET | Freq: Two times a day (BID) | ORAL | Status: DC

## 2013-11-16 NOTE — Telephone Encounter (Signed)
Pt states he thought he was to get a prescription from yesterday, but did not know where it was called into.  Dr. Paulla Dolly ordered Diclofenac 75 mg # 60 one tablet bid 1 refill.  Orders sent to [facility].

## 2013-11-22 ENCOUNTER — Encounter: Payer: Self-pay | Admitting: Internal Medicine

## 2013-11-22 ENCOUNTER — Ambulatory Visit (AMBULATORY_SURGERY_CENTER): Payer: BC Managed Care – PPO | Admitting: Internal Medicine

## 2013-11-22 VITALS — BP 139/81 | HR 51 | Temp 98.2°F | Resp 11 | Ht 68.0 in | Wt 187.0 lb

## 2013-11-22 DIAGNOSIS — Z860100 Personal history of colon polyps, unspecified: Secondary | ICD-10-CM

## 2013-11-22 DIAGNOSIS — D120 Benign neoplasm of cecum: Secondary | ICD-10-CM

## 2013-11-22 DIAGNOSIS — D125 Benign neoplasm of sigmoid colon: Secondary | ICD-10-CM

## 2013-11-22 HISTORY — PX: COLONOSCOPY: SHX174

## 2013-11-22 MED ORDER — SODIUM CHLORIDE 0.9 % IV SOLN: 500.0000 mL | INTRAVENOUS | Status: DC

## 2013-11-22 NOTE — Patient Instructions (Signed)

## 2013-11-22 NOTE — Progress Notes (Signed)
Called to room to assist during endoscopic procedure.  Patient ID and intended procedure confirmed with present staff. Received instructions for my participation in the procedure from the performing physician.  

## 2013-11-22 NOTE — Op Note (Signed)
[place]  [address]. [place], [id number]   COLONOSCOPY PROCEDURE REPORT  PATIENT: Matthew, [name]  MR#: [id number] BIRTHDATE: March 27, 1950 , 74  yrs. old GENDER: male ENDOSCOPIST: Jerene Bears, MD REFERRED XY:[id number] Sarajane Jews, M.D. PROCEDURE DATE:  11/22/2013 PROCEDURE:   Colonoscopy with snare polypectomy and Colonoscopy with cold biopsy polypectomy First Screening Colonoscopy - Avg.  risk and is 50 yrs.  old or older - No.  Prior Negative Screening - Now for repeat screening. N/A  History of Adenoma - Now for follow-up colonoscopy & has been > or = to 3 yrs.  Yes hx of adenoma.  Has been 3 or more years since last colonoscopy.  Polyps Removed Today? Yes. ASA CLASS:   Class II INDICATIONS:high risk personal history of colonic polyps. MEDICATIONS: Monitored anesthesia care and Propofol 300 mg IV  DESCRIPTION OF PROCEDURE:   After the risks benefits and alternatives of the procedure were thoroughly explained, informed consent was obtained.  The digital rectal exam revealed no rectal mass.   The LB [id number]  endoscope was introduced through the anus and advanced to the cecum, which was identified by both the appendix and ileocecal valve. No adverse events experienced. The quality of the prep was good, using MoviPrep  The instrument was then slowly withdrawn as the colon was fully examined.  COLON FINDINGS: A sessile polyp measuring 4 mm in size was found at the cecum.  A polypectomy was performed with cold forceps.   A sessile polyp measuring 5 mm in size was found in the sigmoid colon.  A polypectomy was performed with a cold snare.  Retroflexed views revealed internal hemorrhoids. The time to cecum=5 minutes 23 seconds.  Withdrawal time=14 minutes 04 seconds.  The scope was withdrawn and the procedure completed.  COMPLICATIONS: There were no immediate complications.  ENDOSCOPIC IMPRESSION: 1.   Sessile polyp was found at the cecum; polypectomy was  performed with cold forceps 2.   Sessile polyp was found in the sigmoid colon; polypectomy was performed with a cold snare  RECOMMENDATIONS: 1.  Await pathology results 2.  Repeat Colonoscopy in 5 years. 3.  You will receive a letter within 1-2 weeks with the results of your biopsy as well as final recommendations.  Please call my office if you have not received a letter after 3 weeks.  eSigned:  Jerene Bears, MD 11/22/2013 2:18 PM   cc: Laurey Morale, MD and The Patient

## 2013-11-22 NOTE — Progress Notes (Signed)
Report to PACU, RN, vss, BBS= Clear.  

## 2013-11-23 ENCOUNTER — Encounter: Payer: Self-pay | Admitting: Podiatry

## 2013-11-23 ENCOUNTER — Ambulatory Visit (INDEPENDENT_AMBULATORY_CARE_PROVIDER_SITE_OTHER): Payer: BC Managed Care – PPO | Admitting: Ophthalmology

## 2013-11-23 ENCOUNTER — Telehealth: Payer: Self-pay | Admitting: *Deleted

## 2013-11-23 ENCOUNTER — Ambulatory Visit (INDEPENDENT_AMBULATORY_CARE_PROVIDER_SITE_OTHER): Payer: BC Managed Care – PPO | Admitting: Podiatry

## 2013-11-23 ENCOUNTER — Ambulatory Visit: Payer: BC Managed Care – PPO | Admitting: Podiatry

## 2013-11-23 VITALS — BP 124/71 | HR 62 | Resp 16

## 2013-11-23 DIAGNOSIS — M722 Plantar fascial fibromatosis: Secondary | ICD-10-CM

## 2013-11-23 DIAGNOSIS — M2040 Other hammer toe(s) (acquired), unspecified foot: Secondary | ICD-10-CM

## 2013-11-23 DIAGNOSIS — H33303 Unspecified retinal break, bilateral: Secondary | ICD-10-CM

## 2013-11-23 NOTE — Telephone Encounter (Signed)
  Follow up Call-  Call back number 11/22/2013  Post procedure Call Back phone  # [phone]  Permission to leave phone message Yes     Patient questions:  Do you have a fever, pain , or abdominal swelling? No. Pain Score  0 *  Have you tolerated food without any problems? Yes.    Have you been able to return to your normal activities? Yes.    Do you have any questions about your discharge instructions: Diet   No. Medications  No. Follow up visit  No.  Do you have questions or concerns about your Care? No.  Actions: * If pain score is 4 or above: No action needed, pain <4.

## 2013-11-24 NOTE — Progress Notes (Signed)
Subjective:     Patient ID: Matthew [name], male   DOB: 29-Aug-1950, 63 y.o.   MRN: [id number]  HPI patient states that my heel is feeling so much better and I'm able to walk distances without pain   Review of Systems     Objective:   Physical Exam Neurovascular status unchanged with significant diminishment of discomfort plantar aspect heel with fluid buildup that's minimal in intensity    Assessment:     Plantar fasciitis left it's gotten much better    Plan:     Reviewed condition and recommended physical therapy anti-inflammatory therapy. Patient will be seen back as needed

## 2013-11-29 ENCOUNTER — Encounter: Payer: Self-pay | Admitting: Internal Medicine

## 2014-03-07 ENCOUNTER — Ambulatory Visit (INDEPENDENT_AMBULATORY_CARE_PROVIDER_SITE_OTHER): Payer: BLUE CROSS/BLUE SHIELD | Admitting: Ophthalmology

## 2014-03-07 DIAGNOSIS — H33303 Unspecified retinal break, bilateral: Secondary | ICD-10-CM | POA: Diagnosis not present

## 2014-03-07 DIAGNOSIS — H43813 Vitreous degeneration, bilateral: Secondary | ICD-10-CM

## 2014-03-07 DIAGNOSIS — H35371 Puckering of macula, right eye: Secondary | ICD-10-CM | POA: Diagnosis not present

## 2014-03-29 ENCOUNTER — Ambulatory Visit (INDEPENDENT_AMBULATORY_CARE_PROVIDER_SITE_OTHER): Payer: BC Managed Care – PPO | Admitting: Ophthalmology

## 2014-06-09 ENCOUNTER — Other Ambulatory Visit: Payer: Self-pay

## 2014-06-09 ENCOUNTER — Other Ambulatory Visit (INDEPENDENT_AMBULATORY_CARE_PROVIDER_SITE_OTHER): Payer: BLUE CROSS/BLUE SHIELD

## 2014-06-09 DIAGNOSIS — Z0000 Encounter for general adult medical examination without abnormal findings: Secondary | ICD-10-CM | POA: Diagnosis not present

## 2014-06-09 LAB — POCT URINALYSIS DIPSTICK
Bilirubin, UA: NEGATIVE
Blood, UA: NEGATIVE
Glucose, UA: NEGATIVE
KETONES UA: NEGATIVE
Leukocytes, UA: NEGATIVE
Nitrite, UA: NEGATIVE
Protein, UA: NEGATIVE
SPEC GRAV UA: 1.02
UROBILINOGEN UA: 0.2
pH, UA: 6

## 2014-06-09 LAB — LIPID PANEL
Cholesterol: 191 mg/dL (ref 0–200)
HDL: 56.2 mg/dL (ref >39.00)
LDL CALC: 123 mg/dL — AB (ref 0–99)
NonHDL: 134.8
TRIGLYCERIDES: 57 mg/dL (ref 0.0–149.0)
Total CHOL/HDL Ratio: 3
VLDL: 11.4 mg/dL (ref 0.0–40.0)

## 2014-06-09 LAB — BASIC METABOLIC PANEL
BUN: 16 mg/dL (ref 6–23)
CALCIUM: 9.6 mg/dL (ref 8.4–10.5)
CO2: 31 meq/L (ref 19–32)
Chloride: 101 mEq/L (ref 96–112)
Creatinine, Ser: 0.93 mg/dL (ref 0.40–1.50)
GFR: 87 mL/min (ref >60.00)
Glucose, Bld: 90 mg/dL (ref 70–99)
Potassium: 4.7 mEq/L (ref 3.5–5.1)
SODIUM: 138 meq/L (ref 135–145)

## 2014-06-09 LAB — CBC WITH DIFFERENTIAL/PLATELET
BASOS ABS: 0 10*3/uL (ref 0.0–0.1)
BASOS PCT: 0.6 % (ref 0.0–3.0)
EOS PCT: 2.6 % (ref 0.0–5.0)
Eosinophils Absolute: 0.1 10*3/uL (ref 0.0–0.7)
HCT: 45 % (ref 39.0–52.0)
Hemoglobin: 15.3 g/dL (ref 13.0–17.0)
LYMPHS PCT: 51.8 % — AB (ref 12.0–46.0)
Lymphs Abs: 1.8 10*3/uL (ref 0.7–4.0)
MCHC: 33.9 g/dL (ref 30.0–36.0)
MCV: 90 fl (ref 78.0–100.0)
Monocytes Absolute: 0.4 10*3/uL (ref 0.1–1.0)
Monocytes Relative: 11 % (ref 3.0–12.0)
NEUTROS PCT: 34 % — AB (ref 43.0–77.0)
Neutro Abs: 1.2 10*3/uL — ABNORMAL LOW (ref 1.4–7.7)
PLATELETS: 220 10*3/uL (ref 150.0–400.0)
RBC: 5 Mil/uL (ref 4.22–5.81)
RDW: 13.9 % (ref 11.5–15.5)
WBC: 3.4 10*3/uL — ABNORMAL LOW (ref 4.0–10.5)

## 2014-06-09 LAB — PSA: PSA: 0.01 ng/mL — AB (ref 0.10–4.00)

## 2014-06-09 LAB — HEPATIC FUNCTION PANEL
ALK PHOS: 80 U/L (ref 39–117)
ALT: 12 U/L (ref 0–53)
AST: 18 U/L (ref 0–37)
Albumin: 4.4 g/dL (ref 3.5–5.2)
Bilirubin, Direct: 0.1 mg/dL (ref 0.0–0.3)
TOTAL PROTEIN: 7.3 g/dL (ref 6.0–8.3)
Total Bilirubin: 0.6 mg/dL (ref 0.2–1.2)

## 2014-06-09 LAB — TSH: TSH: 0.95 u[IU]/mL (ref 0.35–4.50)

## 2014-06-16 ENCOUNTER — Ambulatory Visit (INDEPENDENT_AMBULATORY_CARE_PROVIDER_SITE_OTHER): Payer: BLUE CROSS/BLUE SHIELD | Admitting: Family Medicine

## 2014-06-16 ENCOUNTER — Encounter: Payer: Self-pay | Admitting: Family Medicine

## 2014-06-16 VITALS — BP 112/68 | Temp 97.8°F | Ht 68.0 in | Wt 192.0 lb

## 2014-06-16 DIAGNOSIS — Z0000 Encounter for general adult medical examination without abnormal findings: Secondary | ICD-10-CM | POA: Diagnosis not present

## 2014-06-16 DIAGNOSIS — Z136 Encounter for screening for cardiovascular disorders: Secondary | ICD-10-CM | POA: Diagnosis not present

## 2014-06-16 MED ORDER — TADALAFIL 20 MG PO TABS: 20.0000 mg | ORAL_TABLET | Freq: Every day | ORAL | Status: DC | PRN

## 2014-06-16 MED ORDER — SUMATRIPTAN SUCCINATE 100 MG PO TABS: 100.0000 mg | ORAL_TABLET | ORAL | Status: DC | PRN

## 2014-06-16 MED ORDER — DOXYCYCLINE HYCLATE 100 MG PO CAPS: 100.0000 mg | ORAL_CAPSULE | Freq: Two times a day (BID) | ORAL | Status: AC

## 2014-06-16 NOTE — Progress Notes (Signed)
   Subjective:    Patient ID: Matthew [name], male    DOB: February 01, 1950, 64 y.o.   MRN: [id number]  HPI 64 yr old male for a cpx. He has a few issues to discuss. First he wants to try Cialis instead of Viagra because Viagra gives him headaches. Also he has started having migraines again. He ad these when was younger. He sometimes wakes up with a HA that causes him to be nauseated. He usually takes some Motrin and goes back to bed. Also for the past few weeks he has ad intermittent sharp pains in the right groin and the RLQ. No fevers. No urinary sx or bowel problems.    Review of Systems  Constitutional: Negative.   HENT: Negative.   Eyes: Negative.   Respiratory: Negative.   Cardiovascular: Negative.   Gastrointestinal: Negative.   Genitourinary: Negative.   Musculoskeletal: Negative.   Skin: Negative.   Neurological: Negative.   Psychiatric/Behavioral: Negative.        Objective:   Physical Exam  Constitutional: He is oriented to person, place, and time. He appears well-developed and well-nourished. No distress.  HENT:  Head: Normocephalic and atraumatic.  Right Ear: External ear normal.  Left Ear: External ear normal.  Nose: Nose normal.  Mouth/Throat: Oropharynx is clear and moist. No oropharyngeal exudate.  Eyes: Conjunctivae and EOM are normal. Pupils are equal, round, and reactive to light. Right eye exhibits no discharge. Left eye exhibits no discharge. No scleral icterus.  Neck: Neck supple. No JVD present. No tracheal deviation present. No thyromegaly present.  Cardiovascular: Normal rate, regular rhythm, normal heart sounds and intact distal pulses.  Exam reveals no gallop and no friction rub.   No murmur heard. EKG normal   Pulmonary/Chest: Effort normal and breath sounds normal. No respiratory distress. He has no wheezes. He has no rales. He exhibits no tenderness.  Abdominal: Soft. Bowel sounds are normal. He exhibits no distension and no mass. There is no  tenderness. There is no rebound and no guarding.  Genitourinary: Rectum normal and penis normal. Guaiac negative stool. No penile tenderness.  Prostate is absent . Quite tender over the right epididymus.   Musculoskeletal: Normal range of motion. He exhibits no edema or tenderness.  Lymphadenopathy:    He has no cervical adenopathy.  Neurological: He is alert and oriented to person, place, and time. He has normal reflexes. No cranial nerve deficit. He exhibits normal muscle tone. Coordination normal.  Skin: Skin is warm and dry. No rash noted. He is not diaphoretic. No erythema. No pallor.  Psychiatric: He has a normal mood and affect. His behavior is normal. Judgment and thought content normal.          Assessment & Plan:  Well exam. Wrote for Cialis to try. He has resumed having migraines so he will try Imitrex prn. He has a mild epididymitis so we will treat this with Doxycycline.

## 2014-11-01 ENCOUNTER — Ambulatory Visit (INDEPENDENT_AMBULATORY_CARE_PROVIDER_SITE_OTHER): Payer: BLUE CROSS/BLUE SHIELD | Admitting: Family Medicine

## 2014-11-01 DIAGNOSIS — Z23 Encounter for immunization: Secondary | ICD-10-CM

## 2015-06-12 ENCOUNTER — Telehealth: Payer: Self-pay | Admitting: Family Medicine

## 2015-06-12 NOTE — Telephone Encounter (Signed)
A PSA is automatically done at his age

## 2015-06-12 NOTE — Telephone Encounter (Signed)
Pt would like to have a PSA done when he has his CPX on 6/22.  May I have a order pls?

## 2015-06-28 ENCOUNTER — Other Ambulatory Visit (INDEPENDENT_AMBULATORY_CARE_PROVIDER_SITE_OTHER): Payer: Managed Care, Other (non HMO)

## 2015-06-28 DIAGNOSIS — Z0000 Encounter for general adult medical examination without abnormal findings: Secondary | ICD-10-CM

## 2015-06-28 LAB — POC URINALSYSI DIPSTICK (AUTOMATED)
BILIRUBIN UA: NEGATIVE
Blood, UA: NEGATIVE
GLUCOSE UA: NEGATIVE
KETONES UA: NEGATIVE
LEUKOCYTES UA: NEGATIVE
Nitrite, UA: NEGATIVE
PROTEIN UA: NEGATIVE
SPEC GRAV UA: 1.01
Urobilinogen, UA: 0.2
pH, UA: 6

## 2015-06-28 LAB — BASIC METABOLIC PANEL
BUN: 16 mg/dL (ref 6–23)
CHLORIDE: 102 meq/L (ref 96–112)
CO2: 32 meq/L (ref 19–32)
Calcium: 9.4 mg/dL (ref 8.4–10.5)
Creatinine, Ser: 1.01 mg/dL (ref 0.40–1.50)
GFR: 78.83 mL/min (ref >60.00)
Glucose, Bld: 85 mg/dL (ref 70–99)
POTASSIUM: 4.2 meq/L (ref 3.5–5.1)
SODIUM: 138 meq/L (ref 135–145)

## 2015-06-28 LAB — LIPID PANEL
Cholesterol: 165 mg/dL (ref 0–200)
HDL: 56 mg/dL (ref >39.00)
LDL Cholesterol: 100 mg/dL — ABNORMAL HIGH (ref 0–99)
NONHDL: 109.4
TRIGLYCERIDES: 47 mg/dL (ref 0.0–149.0)
Total CHOL/HDL Ratio: 3
VLDL: 9.4 mg/dL (ref 0.0–40.0)

## 2015-06-28 LAB — HEPATIC FUNCTION PANEL
ALBUMIN: 4.3 g/dL (ref 3.5–5.2)
ALT: 8 U/L (ref 0–53)
AST: 15 U/L (ref 0–37)
Alkaline Phosphatase: 68 U/L (ref 39–117)
Bilirubin, Direct: 0.1 mg/dL (ref 0.0–0.3)
TOTAL PROTEIN: 6.8 g/dL (ref 6.0–8.3)
Total Bilirubin: 0.5 mg/dL (ref 0.2–1.2)

## 2015-06-28 LAB — CBC WITH DIFFERENTIAL/PLATELET
BASOS PCT: 0.8 % (ref 0.0–3.0)
Basophils Absolute: 0 10*3/uL (ref 0.0–0.1)
EOS PCT: 3.3 % (ref 0.0–5.0)
Eosinophils Absolute: 0.1 10*3/uL (ref 0.0–0.7)
HEMATOCRIT: 44.1 % (ref 39.0–52.0)
Hemoglobin: 14.8 g/dL (ref 13.0–17.0)
Lymphs Abs: 1.7 10*3/uL (ref 0.7–4.0)
MCHC: 33.6 g/dL (ref 30.0–36.0)
MCV: 89.1 fl (ref 78.0–100.0)
MONOS PCT: 8.7 % (ref 3.0–12.0)
Monocytes Absolute: 0.3 10*3/uL (ref 0.1–1.0)
NEUTROS ABS: 0.9 10*3/uL — AB (ref 1.4–7.7)
Neutrophils Relative %: 30.5 % — ABNORMAL LOW (ref 43.0–77.0)
PLATELETS: 183 10*3/uL (ref 150.0–400.0)
RBC: 4.95 Mil/uL (ref 4.22–5.81)
RDW: 13.4 % (ref 11.5–15.5)
WBC: 3 10*3/uL — ABNORMAL LOW (ref 4.0–10.5)

## 2015-06-28 LAB — TSH: TSH: 1.2 u[IU]/mL (ref 0.35–4.50)

## 2015-06-28 LAB — PSA: PSA: 0.01 ng/mL — AB (ref 0.10–4.00)

## 2015-07-02 ENCOUNTER — Telehealth: Payer: Self-pay | Admitting: Family Medicine

## 2015-07-02 NOTE — Telephone Encounter (Signed)
Error/ltd ° °

## 2015-07-05 ENCOUNTER — Encounter: Payer: BLUE CROSS/BLUE SHIELD | Admitting: Family Medicine

## 2015-07-11 ENCOUNTER — Ambulatory Visit (INDEPENDENT_AMBULATORY_CARE_PROVIDER_SITE_OTHER): Payer: Managed Care, Other (non HMO) | Admitting: Family Medicine

## 2015-07-11 ENCOUNTER — Encounter: Payer: Self-pay | Admitting: Family Medicine

## 2015-07-11 VITALS — BP 128/73 | HR 53 | Temp 97.7°F | Ht 68.0 in | Wt 179.0 lb

## 2015-07-11 DIAGNOSIS — D709 Neutropenia, unspecified: Secondary | ICD-10-CM | POA: Diagnosis not present

## 2015-07-11 DIAGNOSIS — Z0000 Encounter for general adult medical examination without abnormal findings: Secondary | ICD-10-CM

## 2015-07-11 MED ORDER — SILDENAFIL CITRATE 100 MG PO TABS: 50.0000 mg | ORAL_TABLET | Freq: Every day | ORAL | Status: DC | PRN

## 2015-07-11 NOTE — Progress Notes (Signed)
Pre visit review using our clinic review tool, if applicable. No additional management support is needed unless otherwise documented below in the visit note. 

## 2015-07-12 NOTE — Progress Notes (Signed)
   Subjective:    Patient ID: Matthew [name], male    DOB: 07/02/1950, 65 y.o.   MRN: [id number]  HPI 65 yr old male for a well exam. He feels well. He is recovering from an apparent tick bite on the right flank for which he took a course of Keflex. This is healing nicely.    Review of Systems  Constitutional: Negative.   HENT: Negative.   Eyes: Negative.   Respiratory: Negative.   Cardiovascular: Negative.   Gastrointestinal: Negative.   Genitourinary: Negative.   Musculoskeletal: Negative.   Skin: Negative.   Neurological: Negative.   Psychiatric/Behavioral: Negative.        Objective:   Physical Exam  Constitutional: He is oriented to person, place, and time. He appears well-developed and well-nourished. No distress.  HENT:  Head: Normocephalic and atraumatic.  Right Ear: External ear normal.  Left Ear: External ear normal.  Nose: Nose normal.  Mouth/Throat: Oropharynx is clear and moist. No oropharyngeal exudate.  Eyes: Conjunctivae and EOM are normal. Pupils are equal, round, and reactive to light. Right eye exhibits no discharge. Left eye exhibits no discharge. No scleral icterus.  Neck: Neck supple. No JVD present. No tracheal deviation present. No thyromegaly present.  Cardiovascular: Normal rate, regular rhythm, normal heart sounds and intact distal pulses.  Exam reveals no gallop and no friction rub.   No murmur heard. EKG normal   Pulmonary/Chest: Effort normal and breath sounds normal. No respiratory distress. He has no wheezes. He has no rales. He exhibits no tenderness.  Abdominal: Soft. Bowel sounds are normal. He exhibits no distension and no mass. There is no tenderness. There is no rebound and no guarding.  Genitourinary: Rectum normal, prostate normal and penis normal. Guaiac negative stool. No penile tenderness.  Musculoskeletal: Normal range of motion. He exhibits no edema or tenderness.  Lymphadenopathy:    He has no cervical adenopathy.    Neurological: He is alert and oriented to person, place, and time. He has normal reflexes. No cranial nerve deficit. He exhibits normal muscle tone. Coordination normal.  Skin: Skin is warm and dry. No rash noted. He is not diaphoretic. No erythema. No pallor.  Psychiatric: He has a normal mood and affect. His behavior is normal. Judgment and thought content normal.          Assessment & Plan:  Well exam. We discussed diet and exercise. Over the past few years his WBC count has steadily declined and the total is 3.0 with an absolute neutrophil count of 0.9. We will refer him to Hematology to evaluate.  Laurey Morale, MD

## 2015-07-27 ENCOUNTER — Encounter: Payer: Self-pay | Admitting: Hematology

## 2015-07-27 ENCOUNTER — Telehealth: Payer: Self-pay | Admitting: Hematology

## 2015-07-27 NOTE — Telephone Encounter (Signed)
Pt confirmed appt, in take completed, will have new insurance card, mailed new pt letter

## 2015-08-07 ENCOUNTER — Ambulatory Visit: Payer: Managed Care, Other (non HMO) | Admitting: Oncology

## 2015-08-08 ENCOUNTER — Telehealth: Payer: Self-pay | Admitting: Hematology

## 2015-08-08 ENCOUNTER — Ambulatory Visit (HOSPITAL_BASED_OUTPATIENT_CLINIC_OR_DEPARTMENT_OTHER): Payer: Managed Care, Other (non HMO)

## 2015-08-08 ENCOUNTER — Ambulatory Visit (HOSPITAL_BASED_OUTPATIENT_CLINIC_OR_DEPARTMENT_OTHER): Payer: Managed Care, Other (non HMO) | Admitting: Hematology

## 2015-08-08 ENCOUNTER — Encounter: Payer: Self-pay | Admitting: Hematology

## 2015-08-08 VITALS — BP 131/78 | HR 56 | Temp 97.7°F | Resp 18 | Wt 179.9 lb

## 2015-08-08 DIAGNOSIS — D709 Neutropenia, unspecified: Secondary | ICD-10-CM | POA: Diagnosis not present

## 2015-08-08 DIAGNOSIS — Z862 Personal history of diseases of the blood and blood-forming organs and certain disorders involving the immune mechanism: Secondary | ICD-10-CM

## 2015-08-08 LAB — CBC & DIFF AND RETIC
BASO%: 0.7 % (ref 0.0–2.0)
Basophils Absolute: 0 10*3/uL (ref 0.0–0.1)
EOS ABS: 0.1 10*3/uL (ref 0.0–0.5)
EOS%: 1.6 % (ref 0.0–7.0)
HCT: 43.4 % (ref 38.4–49.9)
HGB: 14.8 g/dL (ref 13.0–17.1)
IMMATURE RETIC FRACT: 2.1 % — AB (ref 3.00–10.60)
LYMPH#: 1.9 10*3/uL (ref 0.9–3.3)
LYMPH%: 43.8 % (ref 14.0–49.0)
MCH: 30.4 pg (ref 27.2–33.4)
MCHC: 34.1 g/dL (ref 32.0–36.0)
MCV: 89.1 fL (ref 79.3–98.0)
MONO#: 0.3 10*3/uL (ref 0.1–0.9)
MONO%: 7.5 % (ref 0.0–14.0)
NEUT%: 46.4 % (ref 39.0–75.0)
NEUTROS ABS: 2 10*3/uL (ref 1.5–6.5)
NRBC: 0 % (ref 0–0)
Platelets: 159 10*3/uL (ref 140–400)
RBC: 4.87 10*6/uL (ref 4.20–5.82)
RDW: 13.2 % (ref 11.0–14.6)
RETIC %: 0.85 % (ref 0.80–1.80)
RETIC CT ABS: 41.4 10*3/uL (ref 34.80–93.90)
WBC: 4.3 10*3/uL (ref 4.0–10.3)

## 2015-08-08 LAB — COMPREHENSIVE METABOLIC PANEL
ALT: 9 U/L (ref 0–55)
AST: 15 U/L (ref 5–34)
Albumin: 4.1 g/dL (ref 3.5–5.0)
Alkaline Phosphatase: 85 U/L (ref 40–150)
Anion Gap: 11 mEq/L (ref 3–11)
BUN: 16.6 mg/dL (ref 7.0–26.0)
CO2: 26 meq/L (ref 22–29)
Calcium: 9.6 mg/dL (ref 8.4–10.4)
Chloride: 102 mEq/L (ref 98–109)
Creatinine: 0.9 mg/dL (ref 0.7–1.3)
EGFR: 85 mL/min/{1.73_m2} — AB (ref >90)
GLUCOSE: 82 mg/dL (ref 70–140)
Potassium: 3.8 mEq/L (ref 3.5–5.1)
SODIUM: 139 meq/L (ref 136–145)
TOTAL PROTEIN: 7.8 g/dL (ref 6.4–8.3)
Total Bilirubin: 0.67 mg/dL (ref 0.20–1.20)

## 2015-08-08 LAB — CHCC SMEAR

## 2015-08-08 LAB — TSH: TSH: 1.368 m(IU)/L (ref 0.320–4.118)

## 2015-08-08 NOTE — Progress Notes (Signed)
Marland Kitchen    HEMATOLOGY/ONCOLOGY CONSULTATION NOTE  Date of Service: 08/08/2015  Patient Care Team: Laurey Morale, MD as PCP - General  CHIEF COMPLAINTS/PURPOSE OF CONSULTATION:  Neutropenia.  HISTORY OF PRESENTING ILLNESS:   Matthew [name] is a wonderful 65 y.o. male who has been referred to [place] by Dr .Laurey Morale, MD  for evaluation and management of neutropenia.  Patient has a history of prostate cancer treated with prostatectomy , asthma as a child, hemorrhoid status post surgery reports having a tick bite to his right flank in May 2017. He reports that he had a localized rash and was treated with an antibiotic cannot remember which one. Per Dr. Sarajane Jews it was keflex but patient notes it could have been doxycycline.  Patient was recently seen by his primary care physician on 07/12/2015 for a wellness visit and had a CBC which showed leukopenia with a WBC count of 3k with a neutrophil count of 0.9.  Hemoglobin was normal at 14.8 with a normal MCV. Platelets are normal at 220k  Patient's labs reviewed in the system showed he had an [place] of 1.2k about a year ago and 1.5k about 3 years ago.  Notes no fevers no chills no overt viral infections. Other than the antibiotics denies any new medications. No specific chemical exposures.  Notes that he feels well overall. No enlarged lymph nodes. No abdominal pain. No issues with frequent infections.  MEDICAL HISTORY:  Past Medical History:  Diagnosis Date  . Asthma    as child  . Cancer [facility])    prostate   . Cataract   . ED (erectile dysfunction)   . Fracture    rt ankle  . Hemorrhoids   . History of colonic polyps   . History of prostate cancer    see's Dr. Janice Norrie    SURGICAL HISTORY: Past Surgical History:  Procedure Laterality Date  . ANKLE FRACTURE SURGERY Left 2001  . CATARACT EXTRACTION W/ INTRAOCULAR LENS IMPLANT Bilateral    per Dr. Lucita Ferrara   . COLONOSCOPY  11-22-13   per Dr. Hilarie Fredrickson, adenomatous polyp, repeat in 5 yrs  .  [place]  . PROSTATECTOMY  2005  . PROSTATECTOMY  1996  . REFRACTIVE SURGERY Right 11/07/2013  . RETINAL TEAR REPAIR CRYOTHERAPY Bilateral    per Dr. Tempie Hoist  . rt bunion removed Left 1965    SOCIAL HISTORY: Social History   Social History  . Marital status: Married    Spouse name: N/A  . Number of children: N/A  . Years of education: N/A   Occupational History  . Not on file.   Social History Main Topics  . Smoking status: Never Smoker  . Smokeless tobacco: Never Used  . Alcohol use 0.0 oz/week     Comment: once a month  . Drug use: No  . Sexual activity: Not on file   Other Topics Concern  . Not on file   Social History Narrative   Married   Regular exercise- yes    FAMILY HISTORY: Family History  Problem Relation Age of Onset  . Colon cancer Maternal Uncle 75  . Alcohol abuse    . Arthritis    . Breast cancer    . Ovarian cancer    . Prostate cancer    . Stroke      ALLERGIES:  is allergic to ciprofloxacin.  MEDICATIONS:  Current Outpatient Prescriptions  Medication Sig Dispense Refill  . aspirin 81 MG tablet Take 81 mg  by mouth daily.      Marland Kitchen b complex vitamins tablet Take 1 tablet by mouth daily.      . Cholecalciferol (VITAMIN D PO) Take by mouth daily.    . sildenafil (VIAGRA) 100 MG tablet Take 0.5-1 tablets (50-100 mg total) by mouth daily as needed for erectile dysfunction. 10 tablet 11  . SUMAtriptan (IMITREX) 100 MG tablet Take 1 tablet (100 mg total) by mouth as needed for migraine. May repeat in 2 hours if headache persists or recurs. 10 tablet 11  . vitamin A 8000 UNIT capsule Take 8,000 Units by mouth daily.      . vitamin C (ASCORBIC ACID) 500 MG tablet Take 500 mg by mouth daily.      . vitamin E 600 UNIT capsule Take 600 Units by mouth daily.       No current facility-administered medications for this visit.     REVIEW OF SYSTEMS:    10 Point review of Systems was done is negative except as noted  above.  PHYSICAL EXAMINATION: ECOG PERFORMANCE STATUS: 1 - Symptomatic but completely ambulatory  . Vitals:   08/08/15 1050  BP: 131/78  Pulse: (!) 56  Resp: 18  Temp: 97.7 F (36.5 C)   Filed Weights   08/08/15 1050  Weight: 179 lb 14.4 oz (81.6 kg)   .Body mass index is 27.35 kg/m.  GENERAL:alert, in no acute distress and comfortable SKIN: skin color, texture, turgor are normal, no rashes or significant lesions EYES: normal, conjunctiva are pink and non-injected, sclera clear OROPHARYNX:no exudate, no erythema and lips, buccal mucosa, and tongue normal  NECK: supple, no JVD, thyroid normal size, non-tender, without nodularity LYMPH:  no palpable lymphadenopathy in the cervical, axillary or inguinal LUNGS: clear to auscultation with normal respiratory effort HEART: regular rate & rhythm,  no murmurs and no lower extremity edema ABDOMEN: abdomen soft, non-tender, normoactive bowel sounds , No palpable hepatosplenomegaly. Musculoskeletal: no cyanosis of digits and no clubbing  PSYCH: alert & oriented x 3 with fluent speech NEURO: no focal motor/sensory deficits  LABORATORY DATA:  I have reviewed the data as listed  CBC Latest Ref Rng & Units 08/08/2015 06/28/2015 06/09/2014  WBC 4.0 - 10.3 10e3/uL 4.3 3.0(L) 3.4(L)  Hemoglobin 13.0 - 17.1 g/dL 14.8 14.8 15.3  Hematocrit 38.4 - 49.9 % 43.4 44.1 45.0  Platelets 140 - 400 10e3/uL 159 183.0 220.0   . CBC    Component Value Date/Time   WBC 4.3 08/08/2015 1251   WBC 3.0 (L) 06/28/2015 0807   RBC 4.87 08/08/2015 1251   RBC 4.95 06/28/2015 0807   HGB 14.8 08/08/2015 1251   HCT 43.4 08/08/2015 1251   PLT 159 08/08/2015 1251   MCV 89.1 08/08/2015 1251   MCH 30.4 08/08/2015 1251   MCHC 34.1 08/08/2015 1251   MCHC 33.6 06/28/2015 0807   RDW 13.2 08/08/2015 1251   LYMPHSABS 1.9 08/08/2015 1251   MONOABS 0.3 08/08/2015 1251   EOSABS 0.1 08/08/2015 1251   BASOSABS 0.0 08/08/2015 1251     CMP Latest Ref Rng & Units  08/08/2015 06/28/2015 06/09/2014  Glucose 70 - 140 mg/dl 82 85 90  BUN 7.0 - 26.0 mg/dL 16._0 Creatinine 0.7 - 1.3 mg/dL 0.9 1.01 0.93  Sodium 136 - 145 mEq/L 139 138 138  Potassium 3.5 - 5.1 mEq/L 3.8 4.2 4.7  Chloride 96 - 112 mEq/L - 102 101  CO2 22 - 29 mEq/L 26 32 31  Calcium 8.4 - 10.4 mg/dL  9.6 9.4 9.6  Total Protein 6.4 - 8.3 g/dL 7.8 6.8 7.3  Total Bilirubin 0.20 - 1.20 mg/dL 0.67 0.5 0.6  Alkaline Phos 40 - 150 U/L 85 68 80  AST 5 - 34 U/L _0 ALT 0 - 55 U/L _1 RADIOGRAPHIC STUDIES: I have personally reviewed the radiological images as listed and agreed with the findings in the report. No results found.  ASSESSMENT & PLAN:   66 year old Caucasian male in good overall health with  #1 Mild isolated neutropenia x for about 39yr.  (ANC 0.9 on 06/28/2015) No issues with recurrent infections. History of tick bite in May 2017 treated empirically with antibiotics. Currently patient has no fevers no chills no symptoms suggestive of a lymphoproliferative disorder. Family history or personal history of neutropenia as a child or history of recurrent infections to suggest congenital neutropenia.  Could be due to medications (?Viagra), post viral neutropenia, autoimmune etiologies. No symptoms suggestive of tick born illness at this time. B12 and copper within normal limits TSH within normal limits Plan - repeat CBC today shows resolution of the patient's neutropenia . ANC is 2 k. -The rest of his blood counts are completely normal . -Will review of peripheral blood smear to rule out LGL or other abnormalities -An indication for bone marrow biopsy or other treatment at this time . -We will repeat his blood counts in 6 months to ensure stability.   Return of care with Dr. KIrene Limboin 6 months with repeat CBC with differential and CMP.  All of the patients questions were answered with apparent satisfaction. The patient knows to call the clinic with any problems,  questions or concerns.  I spent 40 minutes counseling the patient face to face. The total time spent in the appointment was 423mutes and more than 50% was on counseling and direct patient cares.    GaSullivan LoneD MSWilliston ParkAHIVMS SCChildren'S Hospital Of [facility] ([facility])TPlastic Surgical Center Of Mississippiematology/Oncology Physician CoQuince Orchard Surgery [facility] LLC(Office):       [phone] cell):  [phone]):           [phone]/02/2015 11:46 AM

## 2015-08-08 NOTE — Telephone Encounter (Signed)
per pof to sch pt appt-gave pt copy of avs/cal °

## 2015-08-09 LAB — VITAMIN B12

## 2015-08-10 LAB — COPPER, SERUM: Copper: 89 ug/dL (ref 72–166)

## 2015-08-13 LAB — MULTIPLE MYELOMA PANEL, SERUM
ALBUMIN/GLOB SERPL: 1.6 (ref 0.7–1.7)
ALPHA2 GLOB SERPL ELPH-MCNC: 0.6 g/dL (ref 0.4–1.0)
Albumin SerPl Elph-Mcnc: 4.2 g/dL (ref 2.9–4.4)
Alpha 1: 0.2 g/dL (ref 0.0–0.4)
B-GLOBULIN SERPL ELPH-MCNC: 1.1 g/dL (ref 0.7–1.3)
GAMMA GLOB SERPL ELPH-MCNC: 0.9 g/dL (ref 0.4–1.8)
GLOBULIN, TOTAL: 2.8 g/dL (ref 2.2–3.9)
IgA, Qn, Serum: 386 mg/dL (ref 61–437)
IgM, Qn, Serum: 49 mg/dL (ref 20–172)
Total Protein: 7 g/dL (ref 6.0–8.5)

## 2015-10-17 DIAGNOSIS — R69 Illness, unspecified: Secondary | ICD-10-CM | POA: Diagnosis not present

## 2015-11-17 DIAGNOSIS — R69 Illness, unspecified: Secondary | ICD-10-CM | POA: Diagnosis not present

## 2016-01-19 DIAGNOSIS — Z6828 Body mass index (BMI) 28.0-28.9, adult: Secondary | ICD-10-CM | POA: Diagnosis not present

## 2016-01-19 DIAGNOSIS — Z7982 Long term (current) use of aspirin: Secondary | ICD-10-CM | POA: Diagnosis not present

## 2016-01-19 DIAGNOSIS — Z79899 Other long term (current) drug therapy: Secondary | ICD-10-CM | POA: Diagnosis not present

## 2016-01-19 DIAGNOSIS — N529 Male erectile dysfunction, unspecified: Secondary | ICD-10-CM | POA: Diagnosis not present

## 2016-01-19 DIAGNOSIS — H9313 Tinnitus, bilateral: Secondary | ICD-10-CM | POA: Diagnosis not present

## 2016-01-19 DIAGNOSIS — Z0000 Encounter for general adult medical examination without abnormal findings: Secondary | ICD-10-CM | POA: Diagnosis not present

## 2016-02-08 ENCOUNTER — Other Ambulatory Visit (HOSPITAL_BASED_OUTPATIENT_CLINIC_OR_DEPARTMENT_OTHER): Payer: Managed Care, Other (non HMO)

## 2016-02-08 ENCOUNTER — Ambulatory Visit (HOSPITAL_BASED_OUTPATIENT_CLINIC_OR_DEPARTMENT_OTHER): Payer: Managed Care, Other (non HMO) | Admitting: Hematology

## 2016-02-08 ENCOUNTER — Encounter: Payer: Self-pay | Admitting: Hematology

## 2016-02-08 VITALS — BP 132/80 | HR 55 | Temp 98.2°F | Resp 16 | Ht 68.0 in | Wt 188.2 lb

## 2016-02-08 DIAGNOSIS — D709 Neutropenia, unspecified: Secondary | ICD-10-CM

## 2016-02-08 LAB — CBC & DIFF AND RETIC
BASO%: 0.6 % (ref 0.0–2.0)
Basophils Absolute: 0 10*3/uL (ref 0.0–0.1)
EOS%: 3.1 % (ref 0.0–7.0)
Eosinophils Absolute: 0.1 10*3/uL (ref 0.0–0.5)
HCT: 42 % (ref 38.4–49.9)
HGB: 14.8 g/dL (ref 13.0–17.1)
IMMATURE RETIC FRACT: 3.7 % (ref 3.00–10.60)
LYMPH%: 55.9 % — AB (ref 14.0–49.0)
MCH: 30.5 pg (ref 27.2–33.4)
MCHC: 35.2 g/dL (ref 32.0–36.0)
MCV: 86.6 fL (ref 79.3–98.0)
MONO#: 0.3 10*3/uL (ref 0.1–0.9)
MONO%: 8.9 % (ref 0.0–14.0)
NEUT#: 1.1 10*3/uL — ABNORMAL LOW (ref 1.5–6.5)
NEUT%: 31.5 % — AB (ref 39.0–75.0)
PLATELETS: 167 10*3/uL (ref 140–400)
RBC: 4.85 10*6/uL (ref 4.20–5.82)
RDW: 12.8 % (ref 11.0–14.6)
Retic %: 0.97 % (ref 0.80–1.80)
Retic Ct Abs: 47.05 10*3/uL (ref 34.80–93.90)
WBC: 3.6 10*3/uL — ABNORMAL LOW (ref 4.0–10.3)
lymph#: 2 10*3/uL (ref 0.9–3.3)

## 2016-02-09 NOTE — Progress Notes (Signed)
Marland Kitchen    HEMATOLOGY/ONCOLOGY CLINIC NOTE  Date of Service: .02/08/2016  Patient Care Team: Laurey Morale, MD as PCP - General  CHIEF COMPLAINTS/PURPOSE OF CONSULTATION:  Neutropenia.  HISTORY OF PRESENTING ILLNESS:   Matthew [name] is a wonderful 66 y.o. male who has been referred to [place] by Dr .Alysia Penna, MD  for evaluation and management of neutropenia.  Patient has a history of prostate cancer treated with prostatectomy , asthma as a child, hemorrhoid status post surgery reports having a tick bite to his right flank in May 2017. He reports that he had a localized rash and was treated with an antibiotic cannot remember which one. Per Dr. Sarajane Jews it was keflex but patient notes it could have been doxycycline.  Patient was recently seen by his primary care physician on 07/12/2015 for a wellness visit and had a CBC which showed leukopenia with a WBC count of 3k with a neutrophil count of 0.9.  Hemoglobin was normal at 14.8 with a normal MCV. Platelets are normal at 220k  Patient's labs reviewed in the system showed he had an [place] of 1.2k about a year ago and 1.5k about 3 years ago.  Notes no fevers no chills no overt viral infections. Other than the antibiotics denies any new medications. No specific chemical exposures.  Notes that he feels well overall. No enlarged lymph nodes. No abdominal pain. No issues with frequent infections.  INTERVAL HISTORY  Mr Romey is here for f/u of his neutropenia.  He notes that he feeling great and has no symptoms and has not had any issues with infections. No new focal symptoms. No fevers/chills/nightsweat/weight loss. No bone pains. His [place] on his last clinic visit was WNL at 2k and is down to 1.1k today with nl hgb and platelets. No new medications.  MEDICAL HISTORY:  Past Medical History:  Diagnosis Date  . Asthma    as child  . Cancer [facility])    prostate   . Cataract   . ED (erectile dysfunction)   . Fracture    rt ankle  . Hemorrhoids     . History of colonic polyps   . History of prostate cancer    see's Dr. Janice Norrie    SURGICAL HISTORY: Past Surgical History:  Procedure Laterality Date  . ANKLE FRACTURE SURGERY Left 2001  . CATARACT EXTRACTION W/ INTRAOCULAR LENS IMPLANT Bilateral    per Dr. Lucita Ferrara   . COLONOSCOPY  11-22-13   per Dr. Hilarie Fredrickson, adenomatous polyp, repeat in 5 yrs  . [place]  . PROSTATECTOMY  2005  . PROSTATECTOMY  1996  . REFRACTIVE SURGERY Right 11/07/2013  . RETINAL TEAR REPAIR CRYOTHERAPY Bilateral    per Dr. Tempie Hoist  . rt bunion removed Left 1965    SOCIAL HISTORY: Social History   Social History  . Marital status: Married    Spouse name: N/A  . Number of children: N/A  . Years of education: N/A   Occupational History  . Not on file.   Social History Main Topics  . Smoking status: Never Smoker  . Smokeless tobacco: Never Used  . Alcohol use 0.0 oz/week     Comment: once a month  . Drug use: No  . Sexual activity: Not on file   Other Topics Concern  . Not on file   Social History Narrative   Married   Regular exercise- yes    FAMILY HISTORY: Family History  Problem Relation Age of Onset  .  Colon cancer Maternal Uncle 75  . Alcohol abuse    . Arthritis    . Breast cancer    . Ovarian cancer    . Prostate cancer    . Stroke      ALLERGIES:  is allergic to ciprofloxacin.  MEDICATIONS:  Current Outpatient Prescriptions  Medication Sig Dispense Refill  . aspirin 81 MG tablet Take 81 mg by mouth daily.      Marland Kitchen b complex vitamins tablet Take 1 tablet by mouth daily.      . Cholecalciferol (VITAMIN D PO) Take by mouth daily.    . sildenafil (VIAGRA) 100 MG tablet Take 0.5-1 tablets (50-100 mg total) by mouth daily as needed for erectile dysfunction. 10 tablet 11  . SUMAtriptan (IMITREX) 100 MG tablet Take 1 tablet (100 mg total) by mouth as needed for migraine. May repeat in 2 hours if headache persists or recurs. 10 tablet 11  . vitamin A 8000  UNIT capsule Take 8,000 Units by mouth daily.      . vitamin C (ASCORBIC ACID) 500 MG tablet Take 500 mg by mouth daily.      . vitamin E 600 UNIT capsule Take 600 Units by mouth daily.       No current facility-administered medications for this visit.     REVIEW OF SYSTEMS:    10 Point review of Systems was done is negative except as noted above.  PHYSICAL EXAMINATION: ECOG PERFORMANCE STATUS: 1 - Symptomatic but completely ambulatory  . Vitals:   02/08/16 0827  BP: 132/80  Pulse: (!) 55  Resp: 16  Temp: 98.2 F (36.8 C)   Filed Weights   02/08/16 0827  Weight: 188 lb 3.2 oz (85.4 kg)   .Body mass index is 28.62 kg/m.  GENERAL:alert, in no acute distress and comfortable SKIN: skin color, texture, turgor are normal, no rashes or significant lesions EYES: normal, conjunctiva are pink and non-injected, sclera clear OROPHARYNX:no exudate, no erythema and lips, buccal mucosa, and tongue normal  NECK: supple, no JVD, thyroid normal size, non-tender, without nodularity LYMPH:  no palpable lymphadenopathy in the cervical, axillary or inguinal LUNGS: clear to auscultation with normal respiratory effort HEART: regular rate & rhythm,  no murmurs and no lower extremity edema ABDOMEN: abdomen soft, non-tender, normoactive bowel sounds , No palpable hepatosplenomegaly. Musculoskeletal: no cyanosis of digits and no clubbing  PSYCH: alert & oriented x 3 with fluent speech NEURO: no focal motor/sensory deficits  LABORATORY DATA:  I have reviewed the data as listed  CBC Latest Ref Rng & Units 02/08/2016 08/08/2015 06/28/2015  WBC 4.0 - 10.3 10e3/uL 3.6(L) 4.3 3.0(L)  Hemoglobin 13.0 - 17.1 g/dL 14.8 14.8 14.8  Hematocrit 38.4 - 49.9 % 42.0 43.4 44.1  Platelets 140 - 400 10e3/uL 167 159 183.0   ANC 1100 . CBC    Component Value Date/Time   WBC 3.6 (L) 02/08/2016 0758   WBC 3.0 (L) 06/28/2015 0807   RBC 4.85 02/08/2016 0758   RBC 4.95 06/28/2015 0807   HGB 14.8 02/08/2016 0758    HCT 42.0 02/08/2016 0758   PLT 167 02/08/2016 0758   MCV 86.6 02/08/2016 0758   MCH 30.5 02/08/2016 0758   MCHC 35.2 02/08/2016 0758   MCHC 33.6 06/28/2015 0807   RDW 12.8 02/08/2016 0758   LYMPHSABS 2.0 02/08/2016 0758   MONOABS 0.3 02/08/2016 0758   EOSABS 0.1 02/08/2016 0758   BASOSABS 0.0 02/08/2016 0758     CMP Latest Ref Rng & Units 08/08/2015  08/08/2015 06/28/2015  Glucose 70 - 140 mg/dl 82 - 85  BUN 7.0 - 26.0 mg/dL 16.6 - 16  Creatinine 0.7 - 1.3 mg/dL 0.9 - 1.01  Sodium 136 - 145 mEq/L 139 - 138  Potassium 3.5 - 5.1 mEq/L 3.8 - 4.2  Chloride 96 - 112 mEq/L - - 102  CO2 22 - 29 mEq/L 26 - 32  Calcium 8.4 - 10.4 mg/dL 9.6 - 9.4  Total Protein 6.0 - 8.5 g/dL 7.8 7.0 6.8  Total Bilirubin 0.20 - 1.20 mg/dL 0.67 - 0.5  Alkaline Phos 40 - 150 U/L 85 - 68  AST 5 - 34 U/L 15 - 15  ALT 0 - 55 U/L 9 - 8    RADIOGRAPHIC STUDIES: I have personally reviewed the radiological images as listed and agreed with the findings in the report. No results found.  ASSESSMENT & PLAN:   66 year old male in good overall health with  #1 Mild isolated neutropenia x for about 3-16yr.  (ANC 0.9 on 06/28/2015 had normalized to 2k n 08/08/2015 and today is 1.1k) No issues with recurrent infections. History of tick bite in May 2017 treated empirically with antibiotics. Currently patient has no fevers no chills no symptoms suggestive of a lymphoproliferative disorder. No Family history or personal history of neutropenia as a child or history of recurrent infections to suggest congenital neutropenia. Could be due to medications (?Viagra), post viral neutropenia, autoimmune etiologies. No symptoms suggestive of tick born illness at this time. B12 and copper within normal limits TSH within normal limits Plan -patient has mild fluctuating intermittent isolated neutropenia which has been non progressive suggesting a relatively benign etiology at this time. ?intermitent medication use or allergic  element. -No indication for bone marrow biopsy or other treatment at this time . -will have the patient continue to followup with PCP to monitor WBC count q6 months. -Kindly reconsult u[place]if the there is a progressive drop in the [place]especially <700 without clear cause or other cytopenias or blood count abnormalities develop.  Continue f/u with PCP . RTC with Dr KIrene Limboon any as needed basis.  All of the patients questions were answered with apparent satisfaction. The patient knows to call the clinic with any problems, questions or concerns.  I spent 15 minutes counseling the patient face to face. The total time spent in the appointment was 20 minutes and more than 50% was on counseling and direct patient cares.    GSullivan LoneMD MPatterson TractAAHIVMS SNorthwest Medical Center - BentonvilleCColeman Cataract And Eye Laser Surgery Center IncHematology/Oncology Physician CBaylor Scott And White [facility] (Office):       [phone](Work cell):  [phone](Fax):           [phone]

## 2016-02-22 DIAGNOSIS — R69 Illness, unspecified: Secondary | ICD-10-CM | POA: Diagnosis not present

## 2016-06-11 DIAGNOSIS — R69 Illness, unspecified: Secondary | ICD-10-CM | POA: Diagnosis not present

## 2016-08-25 DIAGNOSIS — H26492 Other secondary cataract, left eye: Secondary | ICD-10-CM | POA: Diagnosis not present

## 2016-08-25 DIAGNOSIS — H26493 Other secondary cataract, bilateral: Secondary | ICD-10-CM | POA: Diagnosis not present

## 2016-08-25 DIAGNOSIS — Z961 Presence of intraocular lens: Secondary | ICD-10-CM | POA: Diagnosis not present

## 2016-09-03 DIAGNOSIS — H26492 Other secondary cataract, left eye: Secondary | ICD-10-CM | POA: Diagnosis not present

## 2016-09-17 DIAGNOSIS — H26491 Other secondary cataract, right eye: Secondary | ICD-10-CM | POA: Diagnosis not present

## 2016-09-17 DIAGNOSIS — Z961 Presence of intraocular lens: Secondary | ICD-10-CM | POA: Diagnosis not present

## 2016-09-24 DIAGNOSIS — H52223 Regular astigmatism, bilateral: Secondary | ICD-10-CM | POA: Diagnosis not present

## 2016-09-24 DIAGNOSIS — Z9849 Cataract extraction status, unspecified eye: Secondary | ICD-10-CM | POA: Diagnosis not present

## 2016-09-24 DIAGNOSIS — H524 Presbyopia: Secondary | ICD-10-CM | POA: Diagnosis not present

## 2016-09-24 DIAGNOSIS — Z961 Presence of intraocular lens: Secondary | ICD-10-CM | POA: Diagnosis not present

## 2016-09-24 DIAGNOSIS — H5213 Myopia, bilateral: Secondary | ICD-10-CM | POA: Diagnosis not present

## 2016-09-25 ENCOUNTER — Encounter: Payer: Self-pay | Admitting: Family Medicine

## 2016-10-24 DIAGNOSIS — R69 Illness, unspecified: Secondary | ICD-10-CM | POA: Diagnosis not present

## 2016-11-06 ENCOUNTER — Encounter: Payer: Self-pay | Admitting: Family Medicine

## 2016-11-06 ENCOUNTER — Ambulatory Visit (INDEPENDENT_AMBULATORY_CARE_PROVIDER_SITE_OTHER): Payer: 59 | Admitting: Family Medicine

## 2016-11-06 VITALS — BP 122/64 | HR 62 | Temp 97.7°F | Ht 68.0 in | Wt 193.0 lb

## 2016-11-06 DIAGNOSIS — Z0000 Encounter for general adult medical examination without abnormal findings: Secondary | ICD-10-CM | POA: Diagnosis not present

## 2016-11-06 DIAGNOSIS — Z23 Encounter for immunization: Secondary | ICD-10-CM

## 2016-11-06 LAB — BASIC METABOLIC PANEL
BUN: 16 mg/dL (ref 6–23)
CALCIUM: 9.5 mg/dL (ref 8.4–10.5)
CHLORIDE: 102 meq/L (ref 96–112)
CO2: 32 meq/L (ref 19–32)
Creatinine, Ser: 0.91 mg/dL (ref 0.40–1.50)
GFR: 88.54 mL/min (ref >60.00)
Glucose, Bld: 85 mg/dL (ref 70–99)
Potassium: 4.1 mEq/L (ref 3.5–5.1)
SODIUM: 141 meq/L (ref 135–145)

## 2016-11-06 LAB — POC URINALSYSI DIPSTICK (AUTOMATED)
Bilirubin, UA: NEGATIVE
Blood, UA: NEGATIVE
Glucose, UA: NEGATIVE
KETONES UA: NEGATIVE
LEUKOCYTES UA: NEGATIVE
Nitrite, UA: NEGATIVE
PROTEIN UA: NEGATIVE
Spec Grav, UA: 1.015 (ref 1.010–1.025)
Urobilinogen, UA: 0.2 E.U./dL
pH, UA: 7.5 (ref 5.0–8.0)

## 2016-11-06 LAB — HEPATIC FUNCTION PANEL
ALT: 12 U/L (ref 0–53)
AST: 15 U/L (ref 0–37)
Albumin: 4.3 g/dL (ref 3.5–5.2)
Alkaline Phosphatase: 62 U/L (ref 39–117)
BILIRUBIN DIRECT: 0.1 mg/dL (ref 0.0–0.3)
BILIRUBIN TOTAL: 0.6 mg/dL (ref 0.2–1.2)
Total Protein: 7.2 g/dL (ref 6.0–8.3)

## 2016-11-06 LAB — CBC WITH DIFFERENTIAL/PLATELET
Basophils Absolute: 0 10*3/uL (ref 0.0–0.1)
Basophils Relative: 0.8 % (ref 0.0–3.0)
EOS PCT: 2.8 % (ref 0.0–5.0)
Eosinophils Absolute: 0.1 10*3/uL (ref 0.0–0.7)
HEMATOCRIT: 45.1 % (ref 39.0–52.0)
HEMOGLOBIN: 14.9 g/dL (ref 13.0–17.0)
LYMPHS ABS: 1.8 10*3/uL (ref 0.7–4.0)
LYMPHS PCT: 53.9 % — AB (ref 12.0–46.0)
MCHC: 33.2 g/dL (ref 30.0–36.0)
MCV: 92.3 fl (ref 78.0–100.0)
MONOS PCT: 10.2 % (ref 3.0–12.0)
Monocytes Absolute: 0.3 10*3/uL (ref 0.1–1.0)
Neutro Abs: 1.1 10*3/uL — ABNORMAL LOW (ref 1.4–7.7)
Neutrophils Relative %: 32.3 % — ABNORMAL LOW (ref 43.0–77.0)
Platelets: 194 10*3/uL (ref 150.0–400.0)
RBC: 4.88 Mil/uL (ref 4.22–5.81)
RDW: 13.3 % (ref 11.5–15.5)
WBC: 3.3 10*3/uL — AB (ref 4.0–10.5)

## 2016-11-06 LAB — TSH: TSH: 1.33 u[IU]/mL (ref 0.35–4.50)

## 2016-11-06 LAB — LIPID PANEL
Cholesterol: 174 mg/dL (ref 0–200)
HDL: 53.5 mg/dL (ref >39.00)
LDL Cholesterol: 108 mg/dL — ABNORMAL HIGH (ref 0–99)
NONHDL: 120.06
Total CHOL/HDL Ratio: 3
Triglycerides: 61 mg/dL (ref 0.0–149.0)
VLDL: 12.2 mg/dL (ref 0.0–40.0)

## 2016-11-06 LAB — PSA: PSA: 0.01 ng/mL — ABNORMAL LOW (ref 0.10–4.00)

## 2016-11-06 MED ORDER — SILDENAFIL CITRATE 100 MG PO TABS: 100.0000 mg | ORAL_TABLET | Freq: Every day | ORAL | 5 refills | Status: DC | PRN

## 2016-11-06 NOTE — Progress Notes (Signed)
   Subjective:    Patient ID: Matthew [name], male    DOB: 1950-12-21, 66 y.o.   MRN: [id number]  HPI Here for a well exam. He feels fine. He still exercises daily.    Review of Systems  Constitutional: Negative.   HENT: Negative.   Eyes: Negative.   Respiratory: Negative.   Cardiovascular: Negative.   Gastrointestinal: Negative.   Genitourinary: Negative.   Musculoskeletal: Negative.   Skin: Negative.   Neurological: Negative.   Psychiatric/Behavioral: Negative.        Objective:   Physical Exam  Constitutional: He is oriented to person, place, and time. He appears well-developed and well-nourished. No distress.  HENT:  Head: Normocephalic and atraumatic.  Right Ear: External ear normal.  Left Ear: External ear normal.  Nose: Nose normal.  Mouth/Throat: Oropharynx is clear and moist. No oropharyngeal exudate.  Eyes: Pupils are equal, round, and reactive to light. Conjunctivae and EOM are normal. Right eye exhibits no discharge. Left eye exhibits no discharge. No scleral icterus.  Neck: Neck supple. No JVD present. No tracheal deviation present. No thyromegaly present.  Cardiovascular: Normal rate, regular rhythm, normal heart sounds and intact distal pulses.  Exam reveals no gallop and no friction rub.   No murmur heard. Pulmonary/Chest: Effort normal and breath sounds normal. No respiratory distress. He has no wheezes. He has no rales. He exhibits no tenderness.  Abdominal: Soft. Bowel sounds are normal. He exhibits no distension and no mass. There is no tenderness. There is no rebound and no guarding.  Genitourinary: Rectum normal and penis normal. Rectal exam shows guaiac negative stool. No penile tenderness.  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 He exhibits no edema or tenderness.  Lymphadenopathy:    He has no cervical adenopathy.  Neurological: He is alert and oriented to person, place, and time. He has normal  reflexes. No cranial nerve deficit. He exhibits normal muscle tone. Coordination normal.  Skin: Skin is warm and dry. No rash noted. He is not diaphoretic. No erythema. No pallor.  Psychiatric: He has a normal mood and affect. His behavior is normal. Judgment and thought content normal.          Assessment & Plan:  Well exam. We discussed diet and exercise. Get fasting labs. Alysia Penna, MD

## 2016-11-12 ENCOUNTER — Telehealth: Payer: Self-pay | Admitting: Family Medicine

## 2016-11-12 NOTE — Telephone Encounter (Signed)
Call in Imitrex 100 mg to use prn migraines, #10 with 11 rf

## 2016-11-12 NOTE — Telephone Encounter (Signed)
Pt requesting a refill for Imitrex and send to [facility], was recently here in office.

## 2016-11-13 ENCOUNTER — Other Ambulatory Visit: Payer: Self-pay | Admitting: Family Medicine

## 2016-11-14 NOTE — Telephone Encounter (Signed)
RX was e-scribed. 11/14/2016 SPB/CMA

## 2016-11-14 NOTE — Telephone Encounter (Signed)
Sent to PCP for approval.  

## 2016-11-14 NOTE — Telephone Encounter (Signed)
Per PCP orders : Call in Imitrex 100 mg to use prn migraines, #10 with 11 rf. Rx e-scribed.

## 2016-11-18 ENCOUNTER — Telehealth: Payer: Self-pay

## 2016-11-18 NOTE — Telephone Encounter (Signed)
[employer] faxed a note stating Sumatriptan 100mg -10 tablets per 30 days was approved.  This note was faxed to [facility] at [phone].

## 2016-11-18 NOTE — Telephone Encounter (Signed)
Copied from [place]. Topic: Quick Communication - Other Results >> Nov 17, 2016  3:13 PM Self, Rande Brunt, CMA wrote: Received the PA request for Sumatriptan 100 mg tablet. PA submitted via covermymeds. Key:[id number]

## 2017-06-24 DIAGNOSIS — R69 Illness, unspecified: Secondary | ICD-10-CM | POA: Diagnosis not present

## 2017-08-12 ENCOUNTER — Ambulatory Visit (INDEPENDENT_AMBULATORY_CARE_PROVIDER_SITE_OTHER): Payer: Medicare HMO | Admitting: Family Medicine

## 2017-08-12 ENCOUNTER — Encounter: Payer: Self-pay | Admitting: Family Medicine

## 2017-08-12 VITALS — BP 130/80 | HR 60 | Temp 97.8°F | Wt 191.8 lb

## 2017-08-12 DIAGNOSIS — H6123 Impacted cerumen, bilateral: Secondary | ICD-10-CM

## 2017-08-12 NOTE — Progress Notes (Signed)
  Subjective:     Patient ID: Matthew [name], male   DOB: 1950-11-25, 67 y.o.   MRN: [id number]  HPI Patient seen with bilateral ear fullness left greater than right. 3-4 weeks duration. No drainage. Some left ear pain. No vertigo. No fevers or chills. Has history of recurrent cerumen impaction.  He tried using a Q-tip without relief  Past Medical History:  Diagnosis Date  . Asthma    as child  . Cancer [facility])    prostate   . Cataract   . ED (erectile dysfunction)   . Fracture    rt ankle  . Hemorrhoids   . History of colonic polyps   . History of prostate cancer    see's Dr. Janice Norrie   Past Surgical History:  Procedure Laterality Date  . ANKLE FRACTURE SURGERY Left 2001  . CATARACT EXTRACTION W/ INTRAOCULAR LENS IMPLANT Bilateral    per Dr. Lucita Ferrara   . COLONOSCOPY  11-22-13   per Dr. Hilarie Fredrickson, adenomatous polyp, repeat in 5 yrs  . [place]  . PROSTATECTOMY  2005  . PROSTATECTOMY  1996  . REFRACTIVE SURGERY Right 11/07/2013  . RETINAL TEAR REPAIR CRYOTHERAPY Bilateral    per Dr. Tempie Hoist  . rt bunion removed Left 1965    reports that he has never smoked. He has never used smokeless tobacco. He reports that he drinks alcohol. He reports that he does not use drugs. family history includes Alcohol abuse in his unknown relative; Arthritis in his unknown relative; Breast cancer in his unknown relative; Colon cancer (age of onset: 4) in his maternal uncle; Ovarian cancer in his unknown relative; Prostate cancer in his unknown relative; Stroke in his unknown relative. Allergies  Allergen Reactions  . Ciprofloxacin     REACTION: headaches     Review of Systems  Constitutional: Negative for chills and fever.  HENT: Positive for ear pain. Negative for ear discharge.        Objective:   Physical Exam  Constitutional: He appears well-developed and well-nourished.  HENT:  Bilateral cerumen impactions. Removed with irrigation. Mild erythema left external  canal but no signs of otitis externa. No drainage. Minimal erythema left eardrum superiorly. No evidence for effusion.       Assessment:     Bilateral cerumen impaction improved following irrigation    Plan:     -Follow-up as needed  Eulas Post MD [facility]

## 2017-08-12 NOTE — Patient Instructions (Signed)
Earwax Buildup, Adult The ears produce a substance called earwax that helps keep bacteria out of the ear and protects the skin in the ear canal. Occasionally, earwax can build up in the ear and cause discomfort or hearing loss. What increases the risk? This condition is more likely to develop in people who:  Are male.  Are elderly.  Naturally produce more earwax.  Clean their ears often with cotton swabs.  Use earplugs often.  Use in-ear headphones often.  Wear hearing aids.  Have narrow ear canals.  Have earwax that is overly thick or sticky.  Have eczema.  Are dehydrated.  Have excess hair in the ear canal.  What are the signs or symptoms? Symptoms of this condition include:  Reduced or muffled hearing.  A feeling of fullness in the ear or feeling that the ear is plugged.  Fluid coming from the ear.  Ear pain.  Ear itch.  Ringing in the ear.  Coughing.  An obvious piece of earwax that can be seen inside the ear canal.  How is this diagnosed? This condition may be diagnosed based on:  Your symptoms.  Your medical history.  An ear exam. During the exam, your health care provider will look into your ear with an instrument called an otoscope.  You may have tests, including a hearing test. How is this treated? This condition may be treated by:  Using ear drops to soften the earwax.  Having the earwax removed by a health care provider. The health care provider may: ? Flush the ear with water. ? Use an instrument that has a loop on the end (curette). ? Use a suction device.  Surgery to remove the wax buildup. This may be done in severe cases.  Follow these instructions at home:  Take over-the-counter and prescription medicines only as told by your health care provider.  Do not put any objects, including cotton swabs, into your ear. You can clean the opening of your ear canal with a washcloth or facial tissue.  Follow instructions from your health  care provider about cleaning your ears. Do not over-clean your ears.  Drink enough fluid to keep your urine clear or pale yellow. This will help to thin the earwax.  Keep all follow-up visits as told by your health care provider. If earwax builds up in your ears often or if you use hearing aids, consider seeing your health care provider for routine, preventive ear cleanings. Ask your health care provider how often you should schedule your cleanings.  If you have hearing aids, clean them according to instructions from the manufacturer and your health care provider. Contact a health care provider if:  You have ear pain.  You develop a fever.  You have blood, pus, or other fluid coming from your ear.  You have hearing loss.  You have ringing in your ears that does not go away.  Your symptoms do not improve with treatment.  You feel like the room is spinning (vertigo). Summary  Earwax can build up in the ear and cause discomfort or hearing loss.  The most common symptoms of this condition include reduced or muffled hearing and a feeling of fullness in the ear or feeling that the ear is plugged.  This condition may be diagnosed based on your symptoms, your medical history, and an ear exam.  This condition may be treated by using ear drops to soften the earwax or by having the earwax removed by a health care provider.  Do   not put any objects, including cotton swabs, into your ear. You can clean the opening of your ear canal with a washcloth or facial tissue. This information is not intended to replace advice given to you by your health care provider. Make sure you discuss any questions you have with your health care provider. Document Released: 01/31/2004 Document Revised: 03/05/2016 Document Reviewed: 03/05/2016 Elsevier Interactive Patient Education  2018 [employer] Inc[employer]  

## 2017-10-13 DIAGNOSIS — R69 Illness, unspecified: Secondary | ICD-10-CM | POA: Diagnosis not present

## 2017-11-06 ENCOUNTER — Encounter: Payer: Self-pay | Admitting: Family Medicine

## 2017-11-06 ENCOUNTER — Ambulatory Visit (INDEPENDENT_AMBULATORY_CARE_PROVIDER_SITE_OTHER): Payer: Medicare HMO | Admitting: Family Medicine

## 2017-11-06 VITALS — BP 134/74 | HR 64 | Temp 97.7°F | Wt 193.1 lb

## 2017-11-06 DIAGNOSIS — Z23 Encounter for immunization: Secondary | ICD-10-CM

## 2017-11-06 DIAGNOSIS — N529 Male erectile dysfunction, unspecified: Secondary | ICD-10-CM | POA: Diagnosis not present

## 2017-11-06 DIAGNOSIS — N481 Balanitis: Secondary | ICD-10-CM

## 2017-11-06 MED ORDER — TADALAFIL 20 MG PO TABS: 20.0000 mg | ORAL_TABLET | Freq: Every day | ORAL | 11 refills | Status: DC | PRN

## 2017-11-06 MED ORDER — KETOCONAZOLE 2 % EX CREA: 1.0000 "application " | TOPICAL_CREAM | Freq: Two times a day (BID) | CUTANEOUS | 1 refills | Status: DC | PRN

## 2017-11-06 NOTE — Progress Notes (Signed)
   Subjective:    Patient ID: Matthew [name], male    DOB: 30-Oct-1950, 67 y.o.   MRN: [id number]  HPI Here for several issues. First he wants a refill for Cialis. He alternates between this and Viagra about every 6 months. It works fairly well. He also describes a month of mild burning at the tip of the penis when he urinates, some soreness at the head of the penis, and a small amount of whitish material that builds up under the foreskin.    Review of Systems  Constitutional: Negative.   Respiratory: Negative.   Cardiovascular: Negative.   Skin: Positive for rash.  Neurological: Negative.        Objective:   Physical Exam  Constitutional: He appears well-developed and well-nourished.  Cardiovascular: Normal rate, regular rhythm, normal heart sounds and intact distal pulses.  Pulmonary/Chest: Effort normal and breath sounds normal.  Genitourinary:  Genitourinary Comments: He is uncircumcised. The glans is red and irritated and there is some white material present           Assessment & Plan:  He has balanitis, and he will treat this with Ketoconazole cream. Refilled Cialis. Given a flu shot and a pneumococcal 23 vaccine. Alysia Penna, MD

## 2017-12-02 DIAGNOSIS — N529 Male erectile dysfunction, unspecified: Secondary | ICD-10-CM | POA: Diagnosis not present

## 2017-12-02 DIAGNOSIS — R32 Unspecified urinary incontinence: Secondary | ICD-10-CM | POA: Diagnosis not present

## 2017-12-02 DIAGNOSIS — L309 Dermatitis, unspecified: Secondary | ICD-10-CM | POA: Diagnosis not present

## 2017-12-02 DIAGNOSIS — R030 Elevated blood-pressure reading, without diagnosis of hypertension: Secondary | ICD-10-CM | POA: Diagnosis not present

## 2017-12-02 DIAGNOSIS — Z8546 Personal history of malignant neoplasm of prostate: Secondary | ICD-10-CM | POA: Diagnosis not present

## 2017-12-02 DIAGNOSIS — Z809 Family history of malignant neoplasm, unspecified: Secondary | ICD-10-CM | POA: Diagnosis not present

## 2017-12-02 DIAGNOSIS — Z803 Family history of malignant neoplasm of breast: Secondary | ICD-10-CM | POA: Diagnosis not present

## 2018-03-08 DIAGNOSIS — R69 Illness, unspecified: Secondary | ICD-10-CM | POA: Diagnosis not present

## 2018-05-20 DIAGNOSIS — Z20828 Contact with and (suspected) exposure to other viral communicable diseases: Secondary | ICD-10-CM | POA: Diagnosis not present

## 2018-06-15 DIAGNOSIS — R69 Illness, unspecified: Secondary | ICD-10-CM | POA: Diagnosis not present

## 2018-09-08 DIAGNOSIS — R6889 Other general symptoms and signs: Secondary | ICD-10-CM | POA: Diagnosis not present

## 2018-10-11 ENCOUNTER — Ambulatory Visit (INDEPENDENT_AMBULATORY_CARE_PROVIDER_SITE_OTHER): Payer: Medicare HMO | Admitting: Family Medicine

## 2018-10-11 ENCOUNTER — Encounter: Payer: Self-pay | Admitting: Family Medicine

## 2018-10-11 ENCOUNTER — Other Ambulatory Visit: Payer: Self-pay

## 2018-10-11 VITALS — BP 130/82 | HR 57 | Temp 97.3°F | Ht 68.0 in | Wt 188.2 lb

## 2018-10-11 DIAGNOSIS — E785 Hyperlipidemia, unspecified: Secondary | ICD-10-CM

## 2018-10-11 DIAGNOSIS — N529 Male erectile dysfunction, unspecified: Secondary | ICD-10-CM

## 2018-10-11 DIAGNOSIS — L918 Other hypertrophic disorders of the skin: Secondary | ICD-10-CM

## 2018-10-11 DIAGNOSIS — Z86010 Personal history of colonic polyps: Secondary | ICD-10-CM

## 2018-10-11 DIAGNOSIS — Z23 Encounter for immunization: Secondary | ICD-10-CM

## 2018-10-11 DIAGNOSIS — Z8546 Personal history of malignant neoplasm of prostate: Secondary | ICD-10-CM | POA: Diagnosis not present

## 2018-10-11 LAB — POC URINALSYSI DIPSTICK (AUTOMATED)
Glucose, UA: NEGATIVE
Leukocytes, UA: NEGATIVE
Protein, UA: NEGATIVE
Spec Grav, UA: 1.01 (ref 1.010–1.025)
Urobilinogen, UA: 0.2 E.U./dL
pH, UA: 7 (ref 5.0–8.0)

## 2018-10-11 LAB — CBC WITH DIFFERENTIAL/PLATELET
Basophils Absolute: 0 10*3/uL (ref 0.0–0.1)
Basophils Relative: 1.3 % (ref 0.0–3.0)
Eosinophils Absolute: 0.1 10*3/uL (ref 0.0–0.7)
Eosinophils Relative: 1.9 % (ref 0.0–5.0)
HCT: 46 % (ref 39.0–52.0)
Hemoglobin: 15.3 g/dL (ref 13.0–17.0)
Lymphocytes Relative: 53.8 % — ABNORMAL HIGH (ref 12.0–46.0)
Lymphs Abs: 2.1 10*3/uL (ref 0.7–4.0)
MCHC: 33.2 g/dL (ref 30.0–36.0)
MCV: 91.3 fl (ref 78.0–100.0)
Monocytes Absolute: 0.4 10*3/uL (ref 0.1–1.0)
Monocytes Relative: 9 % (ref 3.0–12.0)
Neutro Abs: 1.3 10*3/uL — ABNORMAL LOW (ref 1.4–7.7)
Neutrophils Relative %: 34 % — ABNORMAL LOW (ref 43.0–77.0)
Platelets: 279 10*3/uL (ref 150.0–400.0)
RBC: 5.04 Mil/uL (ref 4.22–5.81)
RDW: 13.5 % (ref 11.5–15.5)
WBC: 3.9 10*3/uL — ABNORMAL LOW (ref 4.0–10.5)

## 2018-10-11 LAB — TSH: TSH: 1.48 u[IU]/mL (ref 0.35–4.50)

## 2018-10-11 LAB — BASIC METABOLIC PANEL
BUN: 13 mg/dL (ref 6–23)
CO2: 32 mEq/L (ref 19–32)
Calcium: 9.9 mg/dL (ref 8.4–10.5)
Chloride: 100 mEq/L (ref 96–112)
Creatinine, Ser: 0.86 mg/dL (ref 0.40–1.50)
GFR: 88.4 mL/min (ref >60.00)
Glucose, Bld: 76 mg/dL (ref 70–99)
Potassium: 3.8 mEq/L (ref 3.5–5.1)
Sodium: 140 mEq/L (ref 135–145)

## 2018-10-11 LAB — LIPID PANEL
Cholesterol: 189 mg/dL (ref 0–200)
HDL: 67.1 mg/dL (ref >39.00)
LDL Cholesterol: 105 mg/dL — ABNORMAL HIGH (ref 0–99)
NonHDL: 122.06
Total CHOL/HDL Ratio: 3
Triglycerides: 85 mg/dL (ref 0.0–149.0)
VLDL: 17 mg/dL (ref 0.0–40.0)

## 2018-10-11 LAB — PSA: PSA: 0.02 ng/mL — ABNORMAL LOW (ref 0.10–4.00)

## 2018-10-11 LAB — HEPATIC FUNCTION PANEL
ALT: 11 U/L (ref 0–53)
AST: 18 U/L (ref 0–37)
Albumin: 4.9 g/dL (ref 3.5–5.2)
Alkaline Phosphatase: 76 U/L (ref 39–117)
Bilirubin, Direct: 0.1 mg/dL (ref 0.0–0.3)
Total Bilirubin: 0.7 mg/dL (ref 0.2–1.2)
Total Protein: 7.8 g/dL (ref 6.0–8.3)

## 2018-10-11 MED ORDER — SILDENAFIL CITRATE 20 MG PO TABS: 20.0000 mg | ORAL_TABLET | ORAL | 11 refills | Status: DC | PRN

## 2018-10-11 MED ORDER — KETOCONAZOLE 2 % EX CREA: 1.0000 "application " | TOPICAL_CREAM | Freq: Two times a day (BID) | CUTANEOUS | 5 refills | Status: DC | PRN

## 2018-10-11 NOTE — Progress Notes (Signed)
Subjective:    Patient ID: Matthew [name], male    DOB: 1950/03/26, 68 y.o.   MRN: [id number]  HPI Here for a well exam. He feels fine. He does mention a skin lesion on the back that gets dore from time to time. His asthma never bothers him. He seldom has migraines any more.    Review of Systems  Constitutional: Negative.   HENT: Negative.   Eyes: Negative.   Respiratory: Negative.   Cardiovascular: Negative.   Gastrointestinal: Negative.   Genitourinary: Negative.   Musculoskeletal: Negative.   Skin: Negative.   Neurological: Negative.   Psychiatric/Behavioral: Negative.        Objective:   Physical Exam Constitutional:      General: He is not in acute distress.    Appearance: He is well-developed. He is not diaphoretic.  HENT:     Head: Normocephalic and atraumatic.     Right Ear: External ear normal.     Left Ear: External ear normal.     Nose: Nose normal.     Mouth/Throat:     Pharynx: No oropharyngeal exudate.  Eyes:     General: No scleral icterus.       Right eye: No discharge.        Left eye: No discharge.     Conjunctiva/sclera: Conjunctivae normal.     Pupils: Pupils are equal, round, and reactive to light.  Neck:     Musculoskeletal: Neck supple.     Thyroid: No thyromegaly.     Vascular: No JVD.     Trachea: No tracheal deviation.  Cardiovascular:     Rate and Rhythm: Normal rate and regular rhythm.     Heart sounds: Normal heart sounds. No murmur. No friction rub. No gallop.   Pulmonary:     Effort: Pulmonary effort is normal. No respiratory distress.     Breath sounds: Normal breath sounds. No wheezing or rales.  Chest:     Chest wall: No tenderness.  Abdominal:     General: Bowel sounds are normal. There is no distension.     Palpations: Abdomen is soft. There is no mass.     Tenderness: There is no abdominal tenderness. There is no guarding or rebound.  Genitourinary:    Penis: Normal. No tenderness.      Scrotum/Testes: Normal.   Rectum: Normal. Guaiac result negative.     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        General: No tenderness.  Lymphadenopathy:     Cervical: No cervical adenopathy.  Skin:    General: Skin is warm and dry.     Coloration: Skin is not pale.     Findings: No erythema or rash.     Comments: There is a skin tag on the left side of the lower back   Neurological:     Mental Status: He is alert and oriented to person, place, and time.     Cranial Nerves: No cranial nerve deficit.     Motor: No abnormal muscle tone.     Coordination: Coordination normal.     Deep Tendon Reflexes: Reflexes are normal and symmetric. Reflexes normal.  Psychiatric:        Behavior: Behavior normal.        Thought Content: Thought content normal.        Judgment: Judgment normal.           Assessment & Plan:  He seems to be  doing well. We discussed diet and exercise. Get fasting labs. His asthma and headaches and ED are stable. We will plan to remove the skin tag sometime soon. He is due for a colonoscopy.  Alysia Penna, MD

## 2018-10-12 ENCOUNTER — Encounter: Payer: Self-pay | Admitting: Internal Medicine

## 2018-10-25 ENCOUNTER — Ambulatory Visit (INDEPENDENT_AMBULATORY_CARE_PROVIDER_SITE_OTHER): Payer: Medicare HMO | Admitting: Family Medicine

## 2018-10-25 ENCOUNTER — Ambulatory Visit: Payer: Medicare HMO | Admitting: Family Medicine

## 2018-10-25 ENCOUNTER — Other Ambulatory Visit: Payer: Self-pay | Admitting: Family Medicine

## 2018-10-25 ENCOUNTER — Encounter: Payer: Self-pay | Admitting: Family Medicine

## 2018-10-25 ENCOUNTER — Other Ambulatory Visit: Payer: Self-pay

## 2018-10-25 VITALS — BP 100/60 | HR 67 | Temp 97.8°F | Ht 68.0 in | Wt 186.9 lb

## 2018-10-25 DIAGNOSIS — L918 Other hypertrophic disorders of the skin: Secondary | ICD-10-CM

## 2018-10-25 DIAGNOSIS — D3617 Benign neoplasm of peripheral nerves and autonomic nervous system of trunk, unspecified: Secondary | ICD-10-CM | POA: Diagnosis not present

## 2018-10-25 NOTE — Progress Notes (Signed)
Matthew [name] DOB: 1950/05/05 Encounter date: 10/25/2018  This is a 68 y.o. male who presents with Chief Complaint  Patient presents with  . Follow-up    patient presents for skin tag excision left lower back    History of present illness: Has been present for at least a few years. Sometimes burns, bothers him, tender to touch. Not sure what aggravates it, but does flare. Has been enlarging in size.    Allergies  Allergen Reactions  . Ciprofloxacin     REACTION: headaches   Current Meds  Medication Sig  . aspirin 81 MG tablet Take 81 mg by mouth daily.    Marland Kitchen b complex vitamins tablet Take 1 tablet by mouth daily.    . Cholecalciferol (VITAMIN D PO) Take by mouth daily.  Marland Kitchen ketoconazole (NIZORAL) 2 % cream Apply 1 application topically 2 (two) times daily as needed for irritation.  . sildenafil (REVATIO) 20 MG tablet Take 1 tablet (20 mg total) by mouth as needed.  . SUMAtriptan (IMITREX) 100 MG tablet TAKE ONE TABLET BY MOUTH AS DIRECTED AS NEEDED FOR  MIGRAINE.  MAY  REPEAT  IN  2  HOURS  IF  HEADACHE  PERSISTS  OF  RECURS  . vitamin A 8000 UNIT capsule Take 8,000 Units by mouth daily.    . vitamin C (ASCORBIC ACID) 500 MG tablet Take 500 mg by mouth daily.    . vitamin E 600 UNIT capsule Take 600 Units by mouth daily.      Review of Systems  Constitutional: Negative for chills, fatigue and fever.  Respiratory: Negative for cough, chest tightness, shortness of breath and wheezing.   Cardiovascular: Negative for chest pain, palpitations and leg swelling.  Skin:       See hpi; skin tag on back irritates him    Objective:  BP 100/60 (BP Location: Left Arm, Patient Position: Sitting, Cuff Size: Large)   Pulse 67   Temp 97.8 F (36.6 C) (Temporal)   Ht 5\' 8"  (1.727 m)   Wt 186 lb 14.4 oz (84.8 kg)   BMI 28.42 kg/m   Weight: 186 lb 14.4 oz (84.8 kg)   BP Readings from Last 3 Encounters:  10/25/18 100/60  10/11/18 130/82  11/06/17 134/74   Wt Readings from Last 3  Encounters:  10/25/18 186 lb 14.4 oz (84.8 kg)  10/11/18 188 lb 3.2 oz (85.4 kg)  11/06/17 193 lb 2 oz (87.6 kg)    Physical Exam Shave Biopsy Procedure Note  Pre-operative Diagnosis: Irritated skin tag  Post-operative Diagnosis: same  Locations: 0.25 cm pedunculated skin tag left lower back  Anesthesia: Lidocaine 1% with epinephrine   Procedure Details  Patient informed of the risks, including bleeding and infection, and benefits of the  procedure and Verbal informed consent obtained. The lesion and surrounding area was given a sterile prep using chloraprep and draped in the usual sterile fashion. The skin lesion was shaved superficially using a dermablade.  Hemostasis of biopsy site was achieved using aluminum chloride. Sterile dressing applied. The specimen was sent for pathologic examination. The patient tolerated the procedure well.  Complications: none.  Plan: 1. Instructed to keep the wound dry and covered for 24 hrs and clean thereafter with soap and water.  But no chlorine hot tubs or pool exposure to surgical site.  2. Warning signs of infection were reviewed.   3. Recommended that the patient use OTC analgesics as needed for pain.  4. Will contact patient with pathology results  when obtained.   1. Skin tag Will call with pathology results, but we discussed that this appears to be a simple skin tag. - Dermatology pathology - PR EXC SKIN BENIG <0.5 CM TRUNK,ARM,LEG   Return if symptoms worsen or fail to improve.    Micheline Rough, MD

## 2018-10-29 ENCOUNTER — Other Ambulatory Visit: Payer: Self-pay

## 2018-11-04 ENCOUNTER — Other Ambulatory Visit: Payer: Self-pay

## 2018-11-04 ENCOUNTER — Ambulatory Visit (AMBULATORY_SURGERY_CENTER): Payer: Self-pay

## 2018-11-04 ENCOUNTER — Encounter: Payer: Self-pay | Admitting: Internal Medicine

## 2018-11-04 VITALS — Temp 96.6°F | Ht 68.0 in | Wt 190.0 lb

## 2018-11-04 DIAGNOSIS — Z86010 Personal history of colonic polyps: Secondary | ICD-10-CM

## 2018-11-04 MED ORDER — NA SULFATE-K SULFATE-MG SULF 17.5-3.13-1.6 GM/177ML PO SOLN: 1.0000 | Freq: Once | ORAL | 0 refills | Status: AC

## 2018-11-04 NOTE — Progress Notes (Signed)
Denies allergies to eggs or soy products. Denies complication of anesthesia or sedation. Denies use of weight loss medication. Denies use of O2.   Emmi instructions given for colonoscopy.  Covid screening scheduled for 11/15/18 @ 8:20 am.

## 2018-11-09 DIAGNOSIS — R69 Illness, unspecified: Secondary | ICD-10-CM | POA: Diagnosis not present

## 2018-11-15 ENCOUNTER — Other Ambulatory Visit: Payer: Self-pay | Admitting: Internal Medicine

## 2018-11-15 DIAGNOSIS — Z1159 Encounter for screening for other viral diseases: Secondary | ICD-10-CM | POA: Diagnosis not present

## 2018-11-16 LAB — SARS CORONAVIRUS 2 (TAT 6-24 HRS): SARS Coronavirus 2: NEGATIVE

## 2018-11-18 ENCOUNTER — Encounter: Payer: Self-pay | Admitting: Internal Medicine

## 2018-11-18 ENCOUNTER — Other Ambulatory Visit: Payer: Self-pay

## 2018-11-18 ENCOUNTER — Ambulatory Visit (AMBULATORY_SURGERY_CENTER): Payer: Medicare HMO | Admitting: Internal Medicine

## 2018-11-18 VITALS — BP 133/71 | HR 58 | Temp 97.6°F | Resp 16 | Ht 68.0 in | Wt 190.0 lb

## 2018-11-18 DIAGNOSIS — D124 Benign neoplasm of descending colon: Secondary | ICD-10-CM

## 2018-11-18 DIAGNOSIS — Z86010 Personal history of colonic polyps: Secondary | ICD-10-CM | POA: Diagnosis not present

## 2018-11-18 DIAGNOSIS — D123 Benign neoplasm of transverse colon: Secondary | ICD-10-CM | POA: Diagnosis not present

## 2018-11-18 DIAGNOSIS — Z1211 Encounter for screening for malignant neoplasm of colon: Secondary | ICD-10-CM | POA: Diagnosis not present

## 2018-11-18 HISTORY — PX: COLONOSCOPY: SHX174

## 2018-11-18 MED ORDER — SODIUM CHLORIDE 0.9 % IV SOLN: 500.0000 mL | Freq: Once | INTRAVENOUS | Status: DC

## 2018-11-18 NOTE — Op Note (Signed)
[facility] Patient Name: Matthew [name] Procedure Date: 11/18/2018 10:34 AM MRN: [id number] Endoscopist: Jerene Bears , MD Age: 68 Referring MD:  Date of Birth: [birth year] Gender: Male Account #: [id number] Procedure:                Colonoscopy Indications:              Surveillance: Personal history of adenomatous                            polyps on last colonoscopy 5 years ago Medicines:                Monitored Anesthesia Care Procedure:                Pre-Anesthesia Assessment:                           - Prior to the procedure, a History and Physical                            was performed, and patient medications and                            allergies were reviewed. The patient's tolerance of                            previous anesthesia was also reviewed. The risks                            and benefits of the procedure and the sedation                            options and risks were discussed with the patient.                            All questions were answered, and informed consent                            was obtained. Prior Anticoagulants: The patient has                            taken no previous anticoagulant or antiplatelet                            agents. ASA Grade Assessment: II - A patient with                            mild systemic disease. After reviewing the risks                            and benefits, the patient was deemed in                            satisfactory condition to undergo the procedure.  After obtaining informed consent, the colonoscope                            was passed under direct vision. Throughout the                            procedure, the patient's blood pressure, pulse, and                            oxygen saturations were monitored continuously. The                            Colonoscope was introduced through the anus and                            advanced to the cecum,  identified by appendiceal                            orifice and ileocecal valve. The colonoscopy was                            performed without difficulty. The patient tolerated                            the procedure well. The quality of the bowel                            preparation was good. The ileocecal valve,                            appendiceal orifice, and rectum were photographed. Scope In: 10:35:54 AM Scope Out: 10:54:41 AM Scope Withdrawal Time: 0 hours 13 minutes 32 seconds  Total Procedure Duration: 0 hours 18 minutes 47 seconds  Findings:                 The digital rectal exam was normal.                           A 4 mm polyp was found in the transverse colon. The                            polyp was sessile. The polyp was removed with a                            cold snare. Resection and retrieval were complete.                           A 3 mm polyp was found in the descending colon. The                            polyp was sessile. The polyp was removed with a  cold snare. Resection and retrieval were complete.                           Scattered large-mouthed diverticula were found in                            the sigmoid colon, splenic flexure and hepatic                            flexure.                           Internal hemorrhoids were found during                            retroflexion. The hemorrhoids were small. Complications:            No immediate complications. Estimated Blood Loss:     Estimated blood loss was minimal. Impression:               - One 4 mm polyp in the transverse colon, removed                            with a cold snare. Resected and retrieved.                           - One 3 mm polyp in the descending colon, removed                            with a cold snare. Resected and retrieved.                           - Diverticulosis in the sigmoid colon, at the                            splenic  flexure and at the hepatic flexure.                           - Internal hemorrhoids. Recommendation:           - Patient has a contact number available for                            emergencies. The signs and symptoms of potential                            delayed complications were discussed with the                            patient. Return to normal activities tomorrow.                            Written discharge instructions were provided to the                            patient.                           -  Resume previous diet.                           - Continue present medications.                           - Await pathology results.                           - Repeat colonoscopy is recommended for                            surveillance. The colonoscopy date will be                            determined after pathology results from today's                            exam become available for review. Jerene Bears, MD 11/18/2018 11:03:15 AM This report has been signed electronically.

## 2018-11-18 NOTE — Progress Notes (Signed)
LC- temp CW- Vitals

## 2018-11-18 NOTE — Patient Instructions (Addendum)
Thank you for letting us take care of your healthcare needs today. Please see handouts given to you on Polyps, Diverticulosis and Hemorrhoids.    YOU HAD AN ENDOSCOPIC PROCEDURE TODAY AT [facility]:   Refer to the procedure report that was given to you for any specific questions about what was found during the examination.  If the procedure report does not answer your questions, please call your gastroenterologist to clarify.  If you requested that your care partner not be given the details of your procedure findings, then the procedure report has been included in a sealed envelope for you to review at your convenience later.  YOU SHOULD EXPECT: Some feelings of bloating in the abdomen. Passage of more gas than usual.  Walking can help get rid of the air that was put into your GI tract during the procedure and reduce the bloating. If you had a lower endoscopy (such as a colonoscopy or flexible sigmoidoscopy) you may notice spotting of blood in your stool or on the toilet paper. If you underwent a bowel prep for your procedure, you may not have a normal bowel movement for a few days.  Please Note:  You might notice some irritation and congestion in your nose or some drainage.  This is from the oxygen used during your procedure.  There is no need for concern and it should clear up in a day or so.  SYMPTOMS TO REPORT IMMEDIATELY:   Following lower endoscopy (colonoscopy or flexible sigmoidoscopy):  Excessive amounts of blood in the stool  Significant tenderness or worsening of abdominal pains  Swelling of the abdomen that is new, acute  Fever of 100F or higher   For urgent or emergent issues, a gastroenterologist can be reached at any hour by calling [phone].   DIET:  We do recommend a small meal at first, but then you may proceed to your regular diet.  Drink plenty of fluids but you should avoid alcoholic beverages for 24 hours.  ACTIVITY:  You should plan to take it  easy for the rest of today and you should NOT DRIVE or use heavy machinery until tomorrow (because of the sedation medicines used during the test).    FOLLOW UP: Our staff will call the number listed on your records 48-72 hours following your procedure to check on you and address any questions or concerns that you may have regarding the information given to you following your procedure. If we do not reach you, we will leave a message.  We will attempt to reach you two times.  During this call, we will ask if you have developed any symptoms of COVID 19. If you develop any symptoms (ie: fever, flu-like symptoms, shortness of breath, cough etc.) before then, please call [phone].  If you test positive for Covid 19 in the 2 weeks post procedure, please call and report this information to [place].    If any biopsies were taken you will be contacted by phone or by letter within the next 1-3 weeks.  Please call us at [phone] if you have not heard about the biopsies in 3 weeks.    SIGNATURES/CONFIDENTIALITY: You and/or your care partner have signed paperwork which will be entered into your electronic medical record.  These signatures attest to the fact that that the information above on your After Visit Summary has been reviewed and is understood.  Full responsibility of the confidentiality of this discharge information lies with you and/or your care-partner.

## 2018-11-18 NOTE — Progress Notes (Signed)
Pt's states no medical or surgical changes since previsit or office visit. 

## 2018-11-18 NOTE — Progress Notes (Signed)
To PACU, VSS. Report to [email or web address] 

## 2018-11-22 ENCOUNTER — Telehealth: Payer: Self-pay

## 2018-11-22 ENCOUNTER — Encounter: Payer: Self-pay | Admitting: Internal Medicine

## 2018-11-22 NOTE — Telephone Encounter (Signed)
  Follow up Call-  Call back number 11/18/2018  Post procedure Call Back phone  # [phone]  Permission to leave phone message Yes  Some recent data might be hidden     Patient questions:  Do you have a fever, pain , or abdominal swelling? No. Pain Score  0 *  Have you tolerated food without any problems? Yes.    Have you been able to return to your normal activities? Yes.    Do you have any questions about your discharge instructions: Diet   No. Medications  No. Follow up visit  No.  Do you have questions or concerns about your Care? Yes.    Actions: * If pain score is 4 or above: No action needed, pain <4.  Pt said he has not had a BM yet.  I asked if he was uncomfortable.  He said he was not.  Pt is back to eating regularly.  I advised him it may take a few days.  If he gets uncomfortable and still not had a BM to call us back.  Maw  1. Have you developed a fever since your procedure? no  2.   Have you had an respiratory symptoms (SOB or cough) since your procedure? no  3.   Have you tested positive for COVID 19 since your procedure no  4.   Have you had any family members/close contacts diagnosed with the COVID 19 since your procedure?  no   If yes to any of these questions please route to Joylene John, RN and Alphonsa Gin, Therapist, sports.

## 2019-02-15 DIAGNOSIS — R69 Illness, unspecified: Secondary | ICD-10-CM | POA: Diagnosis not present

## 2019-03-22 ENCOUNTER — Telehealth: Payer: Self-pay | Admitting: Family Medicine

## 2019-03-22 NOTE — Telephone Encounter (Signed)
Pt is requesting Viagra 100 mg sent to the new pharmacy. Per pt, his previous dosage is not working  [facility]  [address], [place], [place] [address] [phone] patient  contact number  [phone] Jerilynn Mages)

## 2019-03-23 ENCOUNTER — Other Ambulatory Visit: Payer: Self-pay | Admitting: *Deleted

## 2019-03-23 MED ORDER — SILDENAFIL CITRATE 100 MG PO TABS: 100.0000 mg | ORAL_TABLET | Freq: Every day | ORAL | 11 refills | Status: DC | PRN

## 2019-03-23 NOTE — Telephone Encounter (Signed)
Message Routed to PCP  for approval. 

## 2019-03-23 NOTE — Telephone Encounter (Signed)
Rx sent to the pharmacy.

## 2019-03-23 NOTE — Telephone Encounter (Signed)
That's fine. Call in #10 with 11 rf

## 2019-04-12 DIAGNOSIS — H5213 Myopia, bilateral: Secondary | ICD-10-CM | POA: Diagnosis not present

## 2019-04-12 DIAGNOSIS — H43393 Other vitreous opacities, bilateral: Secondary | ICD-10-CM | POA: Diagnosis not present

## 2019-04-12 DIAGNOSIS — Z961 Presence of intraocular lens: Secondary | ICD-10-CM | POA: Diagnosis not present

## 2019-04-12 DIAGNOSIS — Z9849 Cataract extraction status, unspecified eye: Secondary | ICD-10-CM | POA: Diagnosis not present

## 2019-04-12 DIAGNOSIS — H43813 Vitreous degeneration, bilateral: Secondary | ICD-10-CM | POA: Diagnosis not present

## 2019-04-12 DIAGNOSIS — H35432 Paving stone degeneration of retina, left eye: Secondary | ICD-10-CM | POA: Diagnosis not present

## 2019-04-12 DIAGNOSIS — H524 Presbyopia: Secondary | ICD-10-CM | POA: Diagnosis not present

## 2019-04-12 DIAGNOSIS — H33302 Unspecified retinal break, left eye: Secondary | ICD-10-CM | POA: Diagnosis not present

## 2019-04-12 DIAGNOSIS — H52223 Regular astigmatism, bilateral: Secondary | ICD-10-CM | POA: Diagnosis not present

## 2019-05-02 DIAGNOSIS — Z0100 Encounter for examination of eyes and vision without abnormal findings: Secondary | ICD-10-CM | POA: Diagnosis not present

## 2019-09-04 DIAGNOSIS — R69 Illness, unspecified: Secondary | ICD-10-CM | POA: Diagnosis not present

## 2019-10-08 DIAGNOSIS — R69 Illness, unspecified: Secondary | ICD-10-CM | POA: Diagnosis not present

## 2019-10-18 DIAGNOSIS — R69 Illness, unspecified: Secondary | ICD-10-CM | POA: Diagnosis not present

## 2019-11-11 ENCOUNTER — Ambulatory Visit: Payer: Medicare HMO | Attending: Internal Medicine

## 2019-11-11 DIAGNOSIS — Z23 Encounter for immunization: Secondary | ICD-10-CM

## 2019-11-11 NOTE — Progress Notes (Signed)
   Covid-19 Vaccination Clinic  Name:  Matthew [name]    MRN: [id number] DOB: 16-Oct-1950  11/11/2019  [name] was observed post Covid-19 immunization for 15 minutes without incident. He was provided with Vaccine Information Sheet and instruction to access the V-Safe system.   [name] was instructed to call 911 with any severe reactions post vaccine: Marland Kitchen Difficulty breathing  . Swelling of face and throat  . A fast heartbeat  . A bad rash all over body  . Dizziness and weakness

## 2019-12-05 DIAGNOSIS — Z20822 Contact with and (suspected) exposure to covid-19: Secondary | ICD-10-CM | POA: Diagnosis not present

## 2019-12-06 DIAGNOSIS — Z791 Long term (current) use of non-steroidal anti-inflammatories (NSAID): Secondary | ICD-10-CM | POA: Diagnosis not present

## 2019-12-06 DIAGNOSIS — L309 Dermatitis, unspecified: Secondary | ICD-10-CM | POA: Diagnosis not present

## 2019-12-06 DIAGNOSIS — Z881 Allergy status to other antibiotic agents status: Secondary | ICD-10-CM | POA: Diagnosis not present

## 2019-12-06 DIAGNOSIS — Z803 Family history of malignant neoplasm of breast: Secondary | ICD-10-CM | POA: Diagnosis not present

## 2019-12-06 DIAGNOSIS — Z7982 Long term (current) use of aspirin: Secondary | ICD-10-CM | POA: Diagnosis not present

## 2019-12-06 DIAGNOSIS — N529 Male erectile dysfunction, unspecified: Secondary | ICD-10-CM | POA: Diagnosis not present

## 2019-12-06 DIAGNOSIS — R030 Elevated blood-pressure reading, without diagnosis of hypertension: Secondary | ICD-10-CM | POA: Diagnosis not present

## 2019-12-06 DIAGNOSIS — R69 Illness, unspecified: Secondary | ICD-10-CM | POA: Diagnosis not present

## 2019-12-06 DIAGNOSIS — Z8546 Personal history of malignant neoplasm of prostate: Secondary | ICD-10-CM | POA: Diagnosis not present

## 2020-02-09 ENCOUNTER — Other Ambulatory Visit: Payer: Self-pay

## 2020-02-09 ENCOUNTER — Ambulatory Visit (INDEPENDENT_AMBULATORY_CARE_PROVIDER_SITE_OTHER): Payer: Medicare HMO | Admitting: Family Medicine

## 2020-02-09 ENCOUNTER — Encounter: Payer: Self-pay | Admitting: Family Medicine

## 2020-02-09 ENCOUNTER — Telehealth: Payer: Self-pay | Admitting: Family Medicine

## 2020-02-09 VITALS — BP 142/80 | HR 59 | Ht 68.0 in | Wt 190.0 lb

## 2020-02-09 DIAGNOSIS — H6123 Impacted cerumen, bilateral: Secondary | ICD-10-CM

## 2020-02-09 DIAGNOSIS — H9202 Otalgia, left ear: Secondary | ICD-10-CM

## 2020-02-09 DIAGNOSIS — L821 Other seborrheic keratosis: Secondary | ICD-10-CM

## 2020-02-09 DIAGNOSIS — N451 Epididymitis: Secondary | ICD-10-CM

## 2020-02-09 MED ORDER — DOXYCYCLINE HYCLATE 100 MG PO CAPS: 100.0000 mg | ORAL_CAPSULE | Freq: Two times a day (BID) | ORAL | 0 refills | Status: AC

## 2020-02-09 NOTE — Telephone Encounter (Signed)
Advised patient to use heat and ibuprofen.  Also gave patient ENT phone number.

## 2020-02-09 NOTE — Progress Notes (Addendum)
   Subjective:    Patient ID: Matthew [name], male    DOB: December 30, 1950, 70 y.o.   MRN: [id number]  HPI Here for several issues. First for the past year he has had intermittent mild discomfort in the right testicle. No urinary urgency or burning. No fever. Also he wants me to check a skin lesion on the right upper chest and one on the left lower back. These lesions itch at times. Lastly he has noticed decreased hearing in both ears, and he thinks he has another wax build up. No ear pain.   Review of Systems  Constitutional: Negative.   HENT: Positive for hearing loss. Negative for ear discharge and ear pain.   Respiratory: Negative.   Cardiovascular: Negative.   Genitourinary: Positive for testicular pain. Negative for dysuria, frequency, hematuria and urgency.       Objective:   Physical Exam Constitutional:      Appearance: Normal appearance. He is not ill-appearing.  HENT:     Ears:     Comments: Both ear canals are full of cerumen  Cardiovascular:     Rate and Rhythm: Normal rate and regular rhythm.     Pulses: Normal pulses.     Heart sounds: Normal heart sounds.  Pulmonary:     Effort: Pulmonary effort is normal.     Breath sounds: Normal breath sounds.  Genitourinary:    Comments: Both testicles are normal. The right epididymus is tender. No lumps or swelling  Lymphadenopathy:     Cervical: No cervical adenopathy.  Skin:    Comments: He has numerous seborrheic keratoses all over the trunk. The 2 lesions he mentioned are both larger versions of these   Neurological:     Mental Status: He is alert.           Assessment & Plan:  Seborrheic keratoses, no treatment is needed. For the cerumen impactions, we attempted to irrigate these with water, but this caused him too much pain in the left ear. Therefore we will refer him to ENT to clean these out.  For the epididymitis, treat with 10 days of Doxycycline.  Alysia Penna, MD

## 2020-02-09 NOTE — Telephone Encounter (Signed)
Patient called wanting to talk with the nurse about his ear injury that happened in the office today and wants to know what he can do for the pain.  Please advise

## 2020-02-09 NOTE — Addendum Note (Signed)
Addended by: Alysia Penna A on: 02/09/2020 10:15 AM   Modules accepted: Orders

## 2020-02-10 NOTE — Telephone Encounter (Signed)
Noted  

## 2020-02-14 DIAGNOSIS — H6123 Impacted cerumen, bilateral: Secondary | ICD-10-CM | POA: Diagnosis not present

## 2020-03-09 ENCOUNTER — Other Ambulatory Visit: Payer: Self-pay

## 2020-03-09 ENCOUNTER — Encounter: Payer: Self-pay | Admitting: Family Medicine

## 2020-03-09 ENCOUNTER — Ambulatory Visit (INDEPENDENT_AMBULATORY_CARE_PROVIDER_SITE_OTHER): Payer: Medicare HMO | Admitting: Family Medicine

## 2020-03-09 VITALS — BP 140/84 | HR 56 | Temp 97.9°F | Ht 68.0 in | Wt 194.6 lb

## 2020-03-09 DIAGNOSIS — R011 Cardiac murmur, unspecified: Secondary | ICD-10-CM

## 2020-03-09 DIAGNOSIS — Z0000 Encounter for general adult medical examination without abnormal findings: Secondary | ICD-10-CM

## 2020-03-09 DIAGNOSIS — N529 Male erectile dysfunction, unspecified: Secondary | ICD-10-CM

## 2020-03-09 LAB — BASIC METABOLIC PANEL
BUN: 12 mg/dL (ref 6–23)
CO2: 32 mEq/L (ref 19–32)
Calcium: 9.6 mg/dL (ref 8.4–10.5)
Chloride: 100 mEq/L (ref 96–112)
Creatinine, Ser: 0.89 mg/dL (ref 0.40–1.50)
GFR: 87.42 mL/min (ref >60.00)
Glucose, Bld: 87 mg/dL (ref 70–99)
Potassium: 4 mEq/L (ref 3.5–5.1)
Sodium: 139 mEq/L (ref 135–145)

## 2020-03-09 LAB — URINALYSIS
Bilirubin Urine: NEGATIVE
Hgb urine dipstick: NEGATIVE
Ketones, ur: NEGATIVE
Leukocytes,Ua: NEGATIVE
Nitrite: NEGATIVE
Specific Gravity, Urine: 1.01 (ref 1.000–1.030)
Total Protein, Urine: NEGATIVE
Urine Glucose: NEGATIVE
Urobilinogen, UA: 0.2 (ref 0.0–1.0)
pH: 7 (ref 5.0–8.0)

## 2020-03-09 LAB — CBC WITH DIFFERENTIAL/PLATELET
Basophils Absolute: 0 10*3/uL (ref 0.0–0.1)
Basophils Relative: 1.2 % (ref 0.0–3.0)
Eosinophils Absolute: 0.1 10*3/uL (ref 0.0–0.7)
Eosinophils Relative: 3 % (ref 0.0–5.0)
HCT: 44.8 % (ref 39.0–52.0)
Hemoglobin: 15.1 g/dL (ref 13.0–17.0)
Lymphocytes Relative: 53.7 % — ABNORMAL HIGH (ref 12.0–46.0)
Lymphs Abs: 1.9 10*3/uL (ref 0.7–4.0)
MCHC: 33.8 g/dL (ref 30.0–36.0)
MCV: 90 fl (ref 78.0–100.0)
Monocytes Absolute: 0.4 10*3/uL (ref 0.1–1.0)
Monocytes Relative: 11.7 % (ref 3.0–12.0)
Neutro Abs: 1.1 10*3/uL — ABNORMAL LOW (ref 1.4–7.7)
Neutrophils Relative %: 30.4 % — ABNORMAL LOW (ref 43.0–77.0)
Platelets: 192 10*3/uL (ref 150.0–400.0)
RBC: 4.97 Mil/uL (ref 4.22–5.81)
RDW: 13.9 % (ref 11.5–15.5)
WBC: 3.6 10*3/uL — ABNORMAL LOW (ref 4.0–10.5)

## 2020-03-09 LAB — HEPATIC FUNCTION PANEL
ALT: 9 U/L (ref 0–53)
AST: 15 U/L (ref 0–37)
Albumin: 4.4 g/dL (ref 3.5–5.2)
Alkaline Phosphatase: 75 U/L (ref 39–117)
Bilirubin, Direct: 0.1 mg/dL (ref 0.0–0.3)
Total Bilirubin: 0.5 mg/dL (ref 0.2–1.2)
Total Protein: 7.2 g/dL (ref 6.0–8.3)

## 2020-03-09 LAB — TSH: TSH: 1.75 u[IU]/mL (ref 0.35–4.50)

## 2020-03-09 LAB — PSA: PSA: 0 ng/mL — ABNORMAL LOW (ref 0.10–4.00)

## 2020-03-09 LAB — LIPID PANEL
Cholesterol: 167 mg/dL (ref 0–200)
HDL: 62.7 mg/dL (ref >39.00)
LDL Cholesterol: 92 mg/dL (ref 0–99)
NonHDL: 104.14
Total CHOL/HDL Ratio: 3
Triglycerides: 59 mg/dL (ref 0.0–149.0)
VLDL: 11.8 mg/dL (ref 0.0–40.0)

## 2020-03-09 MED ORDER — SILDENAFIL CITRATE 100 MG PO TABS: 100.0000 mg | ORAL_TABLET | Freq: Every day | ORAL | 11 refills | Status: DC | PRN

## 2020-03-09 NOTE — Progress Notes (Signed)
Subjective:    Patient ID: Matthew [name], male    DOB: 23-Nov-1950, 70 y.o.   MRN: [id number]  HPI Here for a well exam. He feels well but he expresses some frustration with ED.  The Viagra does not work as well as it used to. Otherwise he is doing well.    Review of Systems  Constitutional: Negative.   HENT: Negative.   Eyes: Negative.   Respiratory: Negative.   Cardiovascular: Negative.   Gastrointestinal: Negative.   Genitourinary: Negative.   Musculoskeletal: Negative.   Skin: Negative.   Neurological: Negative.   Psychiatric/Behavioral: Negative.        Objective:   Physical Exam Constitutional:      General: He is not in acute distress.    Appearance: Normal appearance. He is well-developed and well-nourished. He is not diaphoretic.  HENT:     Head: Normocephalic and atraumatic.     Right Ear: External ear normal.     Left Ear: External ear normal.     Nose: Nose normal.     Mouth/Throat:     Mouth: Oropharynx is clear and moist.     Pharynx: No oropharyngeal exudate.  Eyes:     General: No scleral icterus.       Right eye: No discharge.        Left eye: No discharge.     Extraocular Movements: EOM normal.     Conjunctiva/sclera: Conjunctivae normal.     Pupils: Pupils are equal, round, and reactive to light.  Neck:     Thyroid: No thyromegaly.     Vascular: No JVD.     Trachea: No tracheal deviation.  Cardiovascular:     Rate and Rhythm: Normal rate and regular rhythm.     Pulses: Intact distal pulses.     Heart sounds: Murmur heard.  No friction rub. No gallop.      Comments: Soft 1/6 SM  Pulmonary:     Effort: Pulmonary effort is normal. No respiratory distress.     Breath sounds: Normal breath sounds. No wheezing or rales.  Chest:     Chest wall: No tenderness.  Abdominal:     General: Bowel sounds are normal. There is no distension.     Palpations: Abdomen is soft. There is no mass.     Tenderness: There is no abdominal tenderness. There is  no guarding or rebound.  Genitourinary:    Penis: Normal. No tenderness.      Testes: Normal.  Musculoskeletal:        General: No tenderness or edema. Normal range of motion.     Cervical back: Neck supple.  Lymphadenopathy:     Cervical: No cervical adenopathy.  Skin:    General: Skin is warm and dry.     Coloration: Skin is not pale.     Findings: No erythema or rash.  Neurological:     Mental Status: He is alert and oriented to person, place, and time.     Cranial Nerves: No cranial nerve deficit.     Motor: No abnormal muscle tone.     Coordination: Coordination normal.     Deep Tendon Reflexes: Reflexes are normal and symmetric. Reflexes normal.  Psychiatric:        Mood and Affect: Mood and affect normal.        Behavior: Behavior normal.        Thought Content: Thought content normal.        Judgment: Judgment normal.  Assessment & Plan:  Well exam. We discussed diet and exercise. Get fasting labs. Refer to Urology for the ED. Evaluate the murmur with an ECHO.  Alysia Penna, MD

## 2020-04-06 DIAGNOSIS — H903 Sensorineural hearing loss, bilateral: Secondary | ICD-10-CM | POA: Diagnosis not present

## 2020-04-06 DIAGNOSIS — H9313 Tinnitus, bilateral: Secondary | ICD-10-CM | POA: Diagnosis not present

## 2020-04-16 DIAGNOSIS — N393 Stress incontinence (female) (male): Secondary | ICD-10-CM | POA: Diagnosis not present

## 2020-04-16 DIAGNOSIS — N3943 Post-void dribbling: Secondary | ICD-10-CM | POA: Diagnosis not present

## 2020-04-16 DIAGNOSIS — Z8546 Personal history of malignant neoplasm of prostate: Secondary | ICD-10-CM | POA: Diagnosis not present

## 2020-04-16 DIAGNOSIS — N5201 Erectile dysfunction due to arterial insufficiency: Secondary | ICD-10-CM | POA: Diagnosis not present

## 2020-05-21 ENCOUNTER — Telehealth: Payer: Self-pay | Admitting: Family Medicine

## 2020-05-21 NOTE — Telephone Encounter (Signed)
Please advise 

## 2020-05-21 NOTE — Telephone Encounter (Signed)
fyi The patient Echo was denied by his insurance company NA Case Number: [id number]    Status: Denied Not able to schedule please advise

## 2020-05-21 NOTE — Telephone Encounter (Signed)
Since his insurance company denied the ECHO, we will do serial exams instead. Have him see me for a 6 month follow up to examine his chest again.

## 2020-05-22 ENCOUNTER — Ambulatory Visit: Payer: Medicare HMO | Admitting: Urology

## 2020-05-24 NOTE — Telephone Encounter (Signed)
Spoke with pt verbalized understanding of Dr Sarajane Jews advise, pt scheduled a 6 months f/u visit for Nov 2022

## 2020-07-19 DIAGNOSIS — N481 Balanitis: Secondary | ICD-10-CM | POA: Diagnosis not present

## 2020-07-19 DIAGNOSIS — N5201 Erectile dysfunction due to arterial insufficiency: Secondary | ICD-10-CM | POA: Diagnosis not present

## 2020-07-19 DIAGNOSIS — Z8546 Personal history of malignant neoplasm of prostate: Secondary | ICD-10-CM | POA: Diagnosis not present

## 2020-08-21 DIAGNOSIS — Z8546 Personal history of malignant neoplasm of prostate: Secondary | ICD-10-CM | POA: Diagnosis not present

## 2020-08-21 DIAGNOSIS — N3943 Post-void dribbling: Secondary | ICD-10-CM | POA: Diagnosis not present

## 2020-08-21 DIAGNOSIS — L309 Dermatitis, unspecified: Secondary | ICD-10-CM | POA: Diagnosis not present

## 2020-08-21 DIAGNOSIS — N529 Male erectile dysfunction, unspecified: Secondary | ICD-10-CM | POA: Diagnosis not present

## 2020-08-21 DIAGNOSIS — Z7982 Long term (current) use of aspirin: Secondary | ICD-10-CM | POA: Diagnosis not present

## 2020-08-21 DIAGNOSIS — Z803 Family history of malignant neoplasm of breast: Secondary | ICD-10-CM | POA: Diagnosis not present

## 2020-09-20 DIAGNOSIS — N5201 Erectile dysfunction due to arterial insufficiency: Secondary | ICD-10-CM | POA: Diagnosis not present

## 2020-09-20 DIAGNOSIS — N481 Balanitis: Secondary | ICD-10-CM | POA: Diagnosis not present

## 2020-09-20 DIAGNOSIS — Z8546 Personal history of malignant neoplasm of prostate: Secondary | ICD-10-CM | POA: Diagnosis not present

## 2020-09-20 DIAGNOSIS — N393 Stress incontinence (female) (male): Secondary | ICD-10-CM | POA: Diagnosis not present

## 2020-09-24 ENCOUNTER — Telehealth: Payer: Self-pay | Admitting: *Deleted

## 2020-09-24 NOTE — Telephone Encounter (Signed)
Called pt to schedule medicare wellness visit and he declined states insurance already called and did a visit with him

## 2020-09-25 ENCOUNTER — Ambulatory Visit: Payer: Medicare HMO | Attending: Family

## 2020-09-25 DIAGNOSIS — Z23 Encounter for immunization: Secondary | ICD-10-CM

## 2020-09-25 NOTE — Progress Notes (Signed)
   Covid-19 Vaccination Clinic  Name:  Matthew [name]    MRN: [id number] DOB: Jun 12, 1950  09/25/2020  Matthew [name] was observed post Covid-19 immunization for 15 minutes without incident. He was provided with Vaccine Information Sheet and instruction to access the V-Safe system.   Matthew [name] was instructed to call 911 with any severe reactions post vaccine: Difficulty breathing  Swelling of face and throat  A fast heartbeat  A bad rash all over body  Dizziness and weakness

## 2020-10-11 DIAGNOSIS — N5201 Erectile dysfunction due to arterial insufficiency: Secondary | ICD-10-CM | POA: Diagnosis not present

## 2020-11-19 ENCOUNTER — Encounter: Payer: Self-pay | Admitting: Family Medicine

## 2020-11-19 ENCOUNTER — Ambulatory Visit (INDEPENDENT_AMBULATORY_CARE_PROVIDER_SITE_OTHER): Payer: Medicare HMO | Admitting: Family Medicine

## 2020-11-19 ENCOUNTER — Ambulatory Visit (INDEPENDENT_AMBULATORY_CARE_PROVIDER_SITE_OTHER): Payer: Medicare HMO

## 2020-11-19 ENCOUNTER — Other Ambulatory Visit: Payer: Self-pay

## 2020-11-19 VITALS — BP 140/82 | HR 58 | Temp 98.0°F | Wt 192.2 lb

## 2020-11-19 DIAGNOSIS — R0602 Shortness of breath: Secondary | ICD-10-CM | POA: Diagnosis not present

## 2020-11-19 LAB — CBC WITH DIFFERENTIAL/PLATELET
Basophils Absolute: 0 10*3/uL (ref 0.0–0.1)
Basophils Relative: 1.2 % (ref 0.0–3.0)
Eosinophils Absolute: 0.1 10*3/uL (ref 0.0–0.7)
Eosinophils Relative: 2.5 % (ref 0.0–5.0)
HCT: 43.2 % (ref 39.0–52.0)
Hemoglobin: 14.8 g/dL (ref 13.0–17.0)
Lymphocytes Relative: 56 % — ABNORMAL HIGH (ref 12.0–46.0)
Lymphs Abs: 2.1 10*3/uL (ref 0.7–4.0)
MCHC: 34.2 g/dL (ref 30.0–36.0)
MCV: 91.1 fl (ref 78.0–100.0)
Monocytes Absolute: 0.3 10*3/uL (ref 0.1–1.0)
Monocytes Relative: 9.1 % (ref 3.0–12.0)
Neutro Abs: 1.2 10*3/uL — ABNORMAL LOW (ref 1.4–7.7)
Neutrophils Relative %: 31.2 % — ABNORMAL LOW (ref 43.0–77.0)
Platelets: 177 10*3/uL (ref 150.0–400.0)
RBC: 4.74 Mil/uL (ref 4.22–5.81)
RDW: 13.6 % (ref 11.5–15.5)
WBC: 3.7 10*3/uL — ABNORMAL LOW (ref 4.0–10.5)

## 2020-11-19 LAB — TSH: TSH: 1.08 u[IU]/mL (ref 0.35–5.50)

## 2020-11-19 LAB — BASIC METABOLIC PANEL
BUN: 13 mg/dL (ref 6–23)
CO2: 28 mEq/L (ref 19–32)
Calcium: 9.2 mg/dL (ref 8.4–10.5)
Chloride: 101 mEq/L (ref 96–112)
Creatinine, Ser: 0.94 mg/dL (ref 0.40–1.50)
GFR: 82.29 mL/min (ref >60.00)
Glucose, Bld: 83 mg/dL (ref 70–99)
Potassium: 3.8 mEq/L (ref 3.5–5.1)
Sodium: 138 mEq/L (ref 135–145)

## 2020-11-19 LAB — HEPATIC FUNCTION PANEL
ALT: 12 U/L (ref 0–53)
AST: 22 U/L (ref 0–37)
Albumin: 4.5 g/dL (ref 3.5–5.2)
Alkaline Phosphatase: 75 U/L (ref 39–117)
Bilirubin, Direct: 0.1 mg/dL (ref 0.0–0.3)
Total Bilirubin: 0.6 mg/dL (ref 0.2–1.2)
Total Protein: 7.4 g/dL (ref 6.0–8.3)

## 2020-11-19 LAB — VITAMIN B12: Vitamin B-12: 721 pg/mL (ref 211–911)

## 2020-11-19 LAB — HEMOGLOBIN A1C: Hgb A1c MFr Bld: 5.8 % (ref 4.6–6.5)

## 2020-11-19 NOTE — Addendum Note (Signed)
Addended by: Rosalyn Gess D on: 11/19/2020 09:23 AM   Modules accepted: Orders

## 2020-11-19 NOTE — Progress Notes (Signed)
   Subjective:    Patient ID: Matthew [name], male    DOB: [birth year], 70 y.o.   MRN: [id number]  HPI Here complaining of SOB on exertion. We saw him in March for a well exam, and he was feeling fine then. However over the past 4 months he has gotten back into playing racquetball 2-3 days a week. He used to do this for years until the Covid pandemic shut the gym down. Now that he is playing again he says he becomes very fatigued and SOB after only 15-20 minutes and he has to stop and sit down. No chest pain or pressure. He feels fine at rest.    Review of Systems  Constitutional:  Positive for fatigue.  Respiratory:  Positive for shortness of breath. Negative for cough, choking and wheezing.   Cardiovascular: Negative.       Objective:   Physical Exam Constitutional:      Appearance: Normal appearance.  Cardiovascular:     Rate and Rhythm: Normal rate and regular rhythm.     Pulses: Normal pulses.     Comments: He had the same 1/6 SM we heard at his well exam.  Pulmonary:     Effort: Pulmonary effort is normal. No respiratory distress.     Breath sounds: Normal breath sounds. No stridor. No wheezing, rhonchi or rales.  Neurological:     Mental Status: He is alert.          Assessment & Plan:  SOB on exertion. We will get labs and a CXR today. Set up an ECHO soon.  Alysia Penna, MD

## 2020-12-12 ENCOUNTER — Ambulatory Visit (HOSPITAL_COMMUNITY): Payer: Medicare HMO | Attending: Cardiology

## 2020-12-12 ENCOUNTER — Other Ambulatory Visit: Payer: Self-pay

## 2020-12-12 DIAGNOSIS — R0602 Shortness of breath: Secondary | ICD-10-CM | POA: Insufficient documentation

## 2020-12-12 LAB — ECHOCARDIOGRAM COMPLETE
AR max vel: 3.05 cm2
AV Area VTI: 2.98 cm2
AV Area mean vel: 2.98 cm2
AV Mean grad: 8.5 mmHg
AV Peak grad: 14.9 mmHg
Ao pk vel: 1.93 m/s
Area-P 1/2: 2.08 cm2
P 1/2 time: 406 msec
S' Lateral: 3.5 cm

## 2020-12-13 NOTE — Addendum Note (Signed)
Addended by: Alysia Penna A on: 12/13/2020 08:43 AM   Modules accepted: Orders

## 2020-12-17 DIAGNOSIS — Z8546 Personal history of malignant neoplasm of prostate: Secondary | ICD-10-CM | POA: Diagnosis not present

## 2020-12-17 DIAGNOSIS — N5201 Erectile dysfunction due to arterial insufficiency: Secondary | ICD-10-CM | POA: Diagnosis not present

## 2020-12-17 DIAGNOSIS — N3943 Post-void dribbling: Secondary | ICD-10-CM | POA: Diagnosis not present

## 2021-01-14 ENCOUNTER — Other Ambulatory Visit: Payer: Self-pay

## 2021-01-14 ENCOUNTER — Ambulatory Visit: Payer: Medicare HMO | Admitting: Cardiovascular Disease

## 2021-01-14 ENCOUNTER — Encounter: Payer: Self-pay | Admitting: Cardiovascular Disease

## 2021-01-14 VITALS — BP 146/80 | HR 65 | Ht 68.0 in | Wt 195.6 lb

## 2021-01-14 DIAGNOSIS — R0609 Other forms of dyspnea: Secondary | ICD-10-CM

## 2021-01-14 DIAGNOSIS — I7121 Aneurysm of the ascending aorta, without rupture: Secondary | ICD-10-CM | POA: Diagnosis not present

## 2021-01-14 DIAGNOSIS — I351 Nonrheumatic aortic (valve) insufficiency: Secondary | ICD-10-CM | POA: Diagnosis not present

## 2021-01-14 LAB — BASIC METABOLIC PANEL
BUN/Creatinine Ratio: 12 (ref 10–24)
BUN: 12 mg/dL (ref 8–27)
CO2: 25 mmol/L (ref 20–29)
Calcium: 9.6 mg/dL (ref 8.6–10.2)
Chloride: 98 mmol/L (ref 96–106)
Creatinine, Ser: 0.97 mg/dL (ref 0.76–1.27)
Glucose: 86 mg/dL (ref 70–99)
Potassium: 4 mmol/L (ref 3.5–5.2)
Sodium: 137 mmol/L (ref 134–144)
eGFR: 84 mL/min/{1.73_m2} (ref >59)

## 2021-01-14 MED ORDER — METOPROLOL TARTRATE 50 MG PO TABS: 50.0000 mg | ORAL_TABLET | Freq: Once | ORAL | 0 refills | Status: DC

## 2021-01-14 NOTE — Progress Notes (Signed)
Chief Complaint  Patient presents with   New Patient (Initial Visit)    Dyspnea   History of Present Illness: 71 yo male with history of prostate cancer and recent diagnosis of aortic valve insufficiency who is here today as a new patient for the evaluation of dyspnea and fatigue. He was seen in November 2022 by Dr. Sharlene Motts and c/o dyspnea on exertion. Echo 12/12/20 with LVEF=55-60%. Moderate basal septal hypertrophy. Moderate aortic insufficiency. Mild dilation of the aortic root. Chest x-ray 11/19/20 with no active disease.   He tells me today that he feels well overall but has noted decreased exercise tolerance and dyspnea with moderate to heavy exertion. No chest pain. No LE edema, dizziness, near syncope.   Primary Care Physician: Laurey Morale, MD   Past Medical History:  Diagnosis Date   Asthma    as child   [facility]cer [facility])    prostate    Cataract    ED (erectile dysfunction)    Fracture    rt ankle   Heart murmur    Hemorrhoids    History of colonic polyps    History of prostate cancer    see's Dr. Janice Norrie    Past Surgical History:  Procedure Laterality Date   ANKLE FRACTURE SURGERY Left 2001   CATARACT EXTRACTION W/ INTRAOCULAR LENS IMPLANT Bilateral    per Dr. Lucita Ferrara    COLONOSCOPY  11/18/2018   per Dr. Hilarie Fredrickson, adenomatous polyps, repeat in 7 yrs   [place] Right 11/07/2013   RETINAL TEAR REPAIR CRYOTHERAPY Bilateral    per Dr. Tempie Hoist   rt bunion removed Left 1965    Current Outpatient Medications  Medication Sig Dispense Refill   aspirin 81 MG tablet Take 81 mg by mouth daily.     b complex vitamins tablet Take 1 tablet by mouth daily.     Cholecalciferol (VITAMIN D PO) Take by mouth daily.     ketoconazole (NIZORAL) 2 % cream Apply 1 application topically 2 (two) times daily as needed for irritation. 30 g 5   metoprolol tartrate (LOPRESSOR) 50 MG tablet Take 1 tablet  (50 mg total) by mouth once for 1 dose. Take 90-120 minutes prior to scan. 1 tablet 0   vitamin A 8000 UNIT capsule Take 8,000 Units by mouth daily.     vitamin C (ASCORBIC ACID) 500 MG tablet Take 500 mg by mouth daily.     vitamin E 600 UNIT capsule Take 600 Units by mouth daily.     No current facility-administered medications for this visit.    Allergies  Allergen Reactions   Ciprofloxacin     REACTION: headaches    Social History   Socioeconomic History   Marital status: Married    Spouse name: Not on file   Number of children: 3   Years of education: Not on file   Highest education level: Not on file  Occupational History   Occupation: Retired  Tobacco Use   Smoking status: Never   Smokeless tobacco: Never  Substance and Sexual Activity   Alcohol use: Yes    Alcohol/week: 0.0 standard drinks    Comment: once a month   Drug use: No   Sexual activity: Not on file  Other Topics Concern   Not on file  Social History Narrative   Married   Regular exercise- yes   Social Determinants of Health  Financial Resource Strain: Not on file  Food Insecurity: Not on file  Transportation Needs: Not on file  Physical Activity: Not on file  Stress: Not on file  Social Connections: Not on file  Intimate Partner Violence: Not on file    Family History  Problem Relation Age of Onset   Alzheimer's disease Mother    Prostate cancer Father    Colon cancer Maternal Uncle 75   Alcohol abuse Other    Arthritis Other    Breast cancer Other    Ovarian cancer Other    Prostate cancer Other    Stroke Other    Esophageal cancer Neg Hx    Rectal cancer Neg Hx    Stomach cancer Neg Hx     Review of Systems:  As stated in the HPI and otherwise negative.   BP (!) 146/80    Pulse 65    Ht 5\' 8"  (1.727 m)    Wt 195 lb 9.6 oz (88.7 kg)    SpO2 98%    BMI 29.74 kg/m   Physical Examination: General: Well developed, well nourished, NAD  HEENT: OP clear, mucus membranes moist   SKIN: warm, dry. No rashes. Neuro: No focal deficits  Musculoskeletal: Muscle strength 5/5 all ext  Psychiatric: Mood and affect normal  Neck: No JVD, no carotid bruits, no thyromegaly, no lymphadenopathy.  Lungs:Clear bilaterally, no wheezes, rhonci, crackles Cardiovascular: Regular rate and rhythm. Soft diastolic murmur.  Abdomen:Soft. Bowel sounds present. Non-tender.  Extremities: No lower extremity edema. Pulses are 2 + in the bilateral DP/PT.  EKG:  EKG is ordered today. The ekg ordered today demonstrates Sinus  Echo 12/12/20:  1. Left ventricular ejection fraction, by estimation, is 55 to 60%. Left  ventricular ejection fraction by 3D volume is 56 %. The left ventricle has  normal function. The left ventricle has no regional wall motion  abnormalities. There is moderate  hypertrophy of the basal septum. The rest of the LV segments demonstrate  mild left ventricular hypertrophy. Left ventricular diastolic parameters  are consistent with Grade I diastolic dysfunction (impaired relaxation).  The average left ventricular global   longitudinal strain is 24.5 %. The global longitudinal strain is normal.   2. Right ventricular systolic function is normal. The right ventricular  size is normal. There is mildly elevated pulmonary artery systolic  pressure. The estimated right ventricular systolic pressure is 79.0 mmHg.   3. Left atrial size was mildly dilated.   4. The mitral valve is normal in structure. Trivial mitral valve  regurgitation. No evidence of mitral stenosis.   5. The aortic valve is tricuspid. There is mild calcification of the  aortic valve. There is mild thickening of the aortic valve. Aortic valve  regurgitation is moderate. PHT 406. Aortic valve sclerosis/calcification  is present, without any evidence of  aortic stenosis.   6. Aortic dilatation noted. There is mild dilatation of the aortic root,  measuring 39 mm. There is mild dilatation of the ascending aorta,   measuring 41 mm.   7. The inferior vena cava is normal in size with greater than 50%  respiratory variability, suggesting right atrial pressure of 3 mmHg.   Recent Labs: 11/19/2020: ALT 12; BUN 13; Creatinine, Ser 0.94; Hemoglobin 14.8; Platelets 177.0; Potassium 3.8; Sodium 138; TSH 1.08   Lipid Panel    Component Value Date/Time   CHOL 167 03/09/2020 0844   TRIG 59.0 03/09/2020 0844   HDL 62.70 03/09/2020 0844   CHOLHDL 3 03/09/2020 0844  VLDL 11.8 03/09/2020 0844   LDLCALC 92 03/09/2020 0844     Wt Readings from Last 3 Encounters:  01/14/21 195 lb 9.6 oz (88.7 kg)  11/19/20 192 lb 4 oz (87.2 kg)  03/09/20 194 lb 9.6 oz (88.3 kg)      Assessment and Plan:   Dyspnea on exertion: He has no evidence of volume overload on exam. I do not think his basal septal hypertrophy and mild to moderate AI is the cause of his dyspnea. Will arrange a gated cardiac CT to exclude CAD.   2.   Moderate aortic insufficiency: Will repeat echo in November 2023.   3.   Dilated aortic root: Will assess ascending aorta size with CTA as above.   Current medicines are reviewed at length with the patient today.  The patient does not have concerns regarding medicines.  The following changes have been made:  no change  Labs/ tests ordered today include:   Orders Placed This Encounter  Procedures   CT CORONARY MORPH W/CTA COR W/SCORE W/CA W/CM &/OR WO/CM   Basic Metabolic Panel (BMET)   EKG 12-Lead     Disposition:   F/U with me in 1-2 months  Signed, Lauree Chandler, MD 01/14/2021 11:47 AM    [facility] [address], [place], [place]  [address] Phone: [phone]; Fax: [phone]

## 2021-01-14 NOTE — Patient Instructions (Signed)
Medication Instructions:  No changes *If you need a refill on your cardiac medications before your next appointment, please call your pharmacy*   Lab Work: Today: BMET   Testing/Procedures: Cardiac CTA - see instructions below.   Follow-Up: At [facility], you and your health needs are our priority.  As part of our continuing mission to provide you with exceptional heart care, we have created designated Provider Care Teams.  These Care Teams include your primary Cardiologist (physician) and Advanced Practice Providers (APPs -  Physician Assistants and Nurse Practitioners) who all work together to provide you with the care you need, when you need it.   Your next appointment:   2 month(s)  The format for your next appointment:   In Person  Provider:   Lauree Chandler, MD  :1}  Other Instructions   Your cardiac CT will be scheduled at  [facility] [place], [place] [address] [phone]  Please arrive at the [facility] main entrance (entrance A) of [facility] 30 minutes prior to test start time. You can use the FREE valet parking offered at the main entrance (encouraged to control the heart rate for the test) Proceed to the [facility] Radiology Department (first floor) to check-in and test prep.  Please follow these instructions carefully (unless otherwise directed):  Hold all erectile dysfunction medications at least 3 days (72 hrs) prior to test.  On the Night Before the Test: Be sure to Drink plenty of water. Do not consume any caffeinated/decaffeinated beverages or chocolate 12 hours prior to your test. Do not take any antihistamines 12 hours prior to your test.  On the Day of the Test: Drink plenty of water until 1 hour prior to the test. Do not eat any food 4 hours prior to the test. You may take your regular medications prior to the test.  Take metoprolol 50 mg (Lopressor) two hours prior to test. HOLD  Furosemide/Hydrochlorothiazide morning of the test.       After the Test: Drink plenty of water. After receiving IV contrast, you may experience a mild flushed feeling. This is normal. On occasion, you may experience a mild rash up to 24 hours after the test. This is not dangerous. If this occurs, you can take Benadryl 25 mg and increase your fluid intake. If you experience trouble breathing, this can be serious. If it is severe call 911 IMMEDIATELY. If it is mild, please call our office. If you take any of these medications: Glipizide/Metformin, Avandament, Glucavance, please do not take 48 hours after completing test unless otherwise instructed.  Please allow 2-4 weeks for scheduling of routine cardiac CTs. Some insurance companies require a pre-authorization which may delay scheduling of this test.   For non-scheduling related questions, please contact the cardiac imaging nurse navigator should you have any questions/concerns: Marchia Bond, Cardiac Imaging Nurse Navigator Gordy Clement, Cardiac Imaging Nurse Navigator [facility] Direct Office Dial: [phone]   For scheduling needs, including cancellations and rescheduling, please call [place], [phone].

## 2021-01-22 ENCOUNTER — Telehealth (HOSPITAL_COMMUNITY): Payer: Self-pay | Admitting: Emergency Medicine

## 2021-01-22 NOTE — Telephone Encounter (Signed)
Reaching out to patient to offer assistance regarding upcoming cardiac imaging study; pt verbalizes understanding of appt date/time, parking situation and where to check in, pre-test NPO status and medications ordered, and verified current allergies; name and call back number provided for further questions should they arise Marchia Bond RN Navigator Cardiac Imaging [facility] [phone] office [phone] cell  50mg  metopolol Arrival 830am Small veins

## 2021-01-23 ENCOUNTER — Ambulatory Visit (HOSPITAL_COMMUNITY)
Admission: RE | Admit: 2021-01-23 | Discharge: 2021-01-23 | Disposition: A | Discharge status: Home / Self Care | Payer: Medicare HMO | Source: Ambulatory Visit | Attending: Cardiovascular Disease | Admitting: Cardiovascular Disease

## 2021-01-23 ENCOUNTER — Other Ambulatory Visit: Payer: Self-pay

## 2021-01-23 ENCOUNTER — Telehealth: Payer: Self-pay | Admitting: Cardiovascular Disease

## 2021-01-23 DIAGNOSIS — K769 Liver disease, unspecified: Secondary | ICD-10-CM | POA: Diagnosis not present

## 2021-01-23 DIAGNOSIS — R0609 Other forms of dyspnea: Secondary | ICD-10-CM | POA: Diagnosis not present

## 2021-01-23 DIAGNOSIS — I351 Nonrheumatic aortic (valve) insufficiency: Secondary | ICD-10-CM | POA: Diagnosis not present

## 2021-01-23 MED ORDER — NITROGLYCERIN 0.4 MG SL SUBL
SUBLINGUAL_TABLET | SUBLINGUAL | Status: AC
  Filled 2021-01-23: qty 2

## 2021-01-23 MED ORDER — NITROGLYCERIN 0.4 MG SL SUBL
0.8000 mg | SUBLINGUAL_TABLET | Freq: Once | SUBLINGUAL | Status: AC
  Administered 2021-01-23: 0.8 mg via SUBLINGUAL

## 2021-01-23 MED ORDER — IOHEXOL 350 MG/ML SOLN
95.0000 mL | Freq: Once | INTRAVENOUS | Status: AC | PRN
  Administered 2021-01-23: 95 mL via INTRAVENOUS

## 2021-01-23 NOTE — Telephone Encounter (Signed)
Message on cardiac cta from Dr. Angelena Form:  Matthew Blanks, MD  01/23/2021  1:29 PM EST     Calcium score zero. No blockages notes. See findings of liver mass. He will need MRI abdomen to evaluate his liver mass. Midge Aver, MD       Left message for patient to call back.

## 2021-01-23 NOTE — Telephone Encounter (Signed)
RN spoke with Shauna Hugh [facility] Radiology calling to report incidental finding on Cardiac CT completed this am 01/23/2021.  See impression below.  Will forward to Dr Camillia Herter review.  Full report is available in Epic.  MPRESSION: 1. Indeterminate lesion in the central aspect of the liver that measures up to 2.7 cm. Recommend further characterization with liver MRI, with and without contrast.

## 2021-01-23 NOTE — Telephone Encounter (Signed)
Calling to give CT reading.

## 2021-01-25 NOTE — Telephone Encounter (Signed)
Spoke with patient.  Reviewed cardiac cta/calcium score and liver lesion.  He understands he will be called to arrange liver mri.

## 2021-01-28 ENCOUNTER — Telehealth: Payer: Self-pay | Admitting: Physician Assistant

## 2021-01-28 NOTE — Telephone Encounter (Signed)
Message to precert and [facility].

## 2021-02-01 ENCOUNTER — Telehealth: Payer: Self-pay | Admitting: Family Medicine

## 2021-02-01 NOTE — Telephone Encounter (Signed)
Spoke with pt to schedule Medicare Annual Wellness Visit (AWV) either virtually or in office.   Patient wanted call back June/July he has other medical issues right now   AWV-I per [place] 08/06/16  please schedule at anytime with [facility] Nurse Health Advisor 1 or 2   This should be a 45 minute visit.

## 2021-02-04 ENCOUNTER — Ambulatory Visit
Admission: RE | Admit: 2021-02-04 | Discharge: 2021-02-04 | Disposition: A | Discharge status: Home / Self Care | Payer: Medicare HMO | Source: Ambulatory Visit | Attending: Cardiovascular Disease | Admitting: Cardiovascular Disease

## 2021-02-04 ENCOUNTER — Other Ambulatory Visit: Payer: Self-pay

## 2021-02-04 DIAGNOSIS — D1803 Hemangioma of intra-abdominal structures: Secondary | ICD-10-CM | POA: Diagnosis not present

## 2021-02-04 DIAGNOSIS — K7689 Other specified diseases of liver: Secondary | ICD-10-CM | POA: Diagnosis not present

## 2021-02-04 DIAGNOSIS — K769 Liver disease, unspecified: Secondary | ICD-10-CM

## 2021-02-04 DIAGNOSIS — R160 Hepatomegaly, not elsewhere classified: Secondary | ICD-10-CM | POA: Diagnosis not present

## 2021-02-04 DIAGNOSIS — N281 Cyst of kidney, acquired: Secondary | ICD-10-CM | POA: Diagnosis not present

## 2021-02-04 MED ORDER — GADOBENATE DIMEGLUMINE 529 MG/ML IV SOLN
18.0000 mL | Freq: Once | INTRAVENOUS | Status: AC | PRN
  Administered 2021-02-04: 18 mL via INTRAVENOUS

## 2021-03-26 ENCOUNTER — Other Ambulatory Visit: Payer: Self-pay

## 2021-03-26 ENCOUNTER — Ambulatory Visit: Payer: Medicare HMO | Admitting: Cardiovascular Disease

## 2021-03-26 ENCOUNTER — Encounter: Payer: Self-pay | Admitting: Cardiovascular Disease

## 2021-03-26 VITALS — BP 150/72 | HR 78 | Ht 68.0 in | Wt 193.0 lb

## 2021-03-26 DIAGNOSIS — I351 Nonrheumatic aortic (valve) insufficiency: Secondary | ICD-10-CM

## 2021-03-26 NOTE — Patient Instructions (Signed)
Medication Instructions:  ?No changes ?*If you need a refill on your cardiac medications before your next appointment, please call your pharmacy* ? ? ?Lab Work: ?none ? ? ?Testing/Procedures: ECHO DUE DEC 2023 ?Your physician has requested that you have an echocardiogram. Echocardiography is a painless test that uses sound waves to create images of your heart. It provides your doctor with information about the size and shape of your heart and how well your heart?s chambers and valves are working. This procedure takes approximately one hour. There are no restrictions for this procedure. ? ? ?Follow-Up: ?At [facility], you and your health needs are our priority.  As part of our continuing mission to provide you with exceptional heart care, we have created designated Provider Care Teams.  These Care Teams include your primary Cardiologist (physician) and Advanced Practice Providers (APPs -  Physician Assistants and Nurse Practitioners) who all work together to provide you with the care you need, when you need it. ? ? ?Your next appointment:   ?12 month(s) ? ?The format for your next appointment:   ?In Person ? ?Provider:   ?Lauree Chandler, MD   ? ?  ?

## 2021-03-26 NOTE — Progress Notes (Signed)
? ?Chief Complaint  ?Patient presents with  ? Follow-up  ?  Aortic valve insufficiency  ? ?History of Present Illness: 71 yo male with history of prostate cancer and aortic valve insufficiency who is here today for follow up. I saw him as a new patient for the evaluation of dyspnea and fatigue in January 2023. He was seen in November 2022 by Dr. Sharlene Motts and c/o dyspnea on exertion. Echo 12/12/20 with LVEF=55-60%. Moderate basal septal hypertrophy. Moderate aortic insufficiency. Mild dilation of the aortic root. Chest x-ray 11/19/20 with no active disease. Coronary CTA 01/1821 with no evidence of CAD, calcium score 0. No evidence of dilated aortic root on CTA January 2023.  ? ?He is here today for follow up. The patient denies any chest pain, dyspnea, palpitations, lower extremity edema, orthopnea, PND, dizziness, near syncope or syncope.  ?  ?Primary Care Physician: Laurey Morale, MD ? ? ?Past Medical History:  ?Diagnosis Date  ? Asthma   ? as child  ? Cancer [facility])   ? prostate   ? Cataract   ? ED (erectile dysfunction)   ? Fracture   ? rt ankle  ? Heart murmur   ? Hemorrhoids   ? History of colonic polyps   ? History of prostate cancer   ? see's Dr. Janice Norrie  ? ? ?Past Surgical History:  ?Procedure Laterality Date  ? ANKLE FRACTURE SURGERY Left 2001  ? CATARACT EXTRACTION W/ INTRAOCULAR LENS IMPLANT Bilateral   ? per Dr. Lucita Ferrara   ? COLONOSCOPY  11/18/2018  ? per Dr. Hilarie Fredrickson, adenomatous polyps, repeat in 7 yrs  ? [place]  ? PROSTATECTOMY  2005  ? PROSTATECTOMY  1996  ? REFRACTIVE SURGERY Right 11/07/2013  ? RETINAL TEAR REPAIR CRYOTHERAPY Bilateral   ? per Dr. Tempie Hoist  ? rt bunion removed Left 1965  ? ? ?Current Outpatient Medications  ?Medication Sig Dispense Refill  ? aspirin 81 MG tablet Take 81 mg by mouth daily.    ? b complex vitamins tablet Take 1 tablet by mouth daily.    ? Cholecalciferol (VITAMIN D PO) Take by mouth daily.    ? ketoconazole (NIZORAL) 2 % cream Apply 1 application  topically 2 (two) times daily as needed for irritation. 30 g 5  ? vitamin A 8000 UNIT capsule Take 8,000 Units by mouth daily.    ? vitamin C (ASCORBIC ACID) 500 MG tablet Take 500 mg by mouth daily.    ? vitamin E 600 UNIT capsule Take 600 Units by mouth daily.    ? ?No current facility-administered medications for this visit.  ? ? ?Allergies  ?Allergen Reactions  ? Ciprofloxacin   ?  REACTION: headaches  ? ? ?Social History  ? ?Socioeconomic History  ? Marital status: Married  ?  Spouse name: Not on file  ? Number of children: 3  ? Years of education: Not on file  ? Highest education level: Not on file  ?Occupational History  ? Occupation: Retired  ?Tobacco Use  ? Smoking status: Never  ? Smokeless tobacco: Never  ?Substance and Sexual Activity  ? Alcohol use: Yes  ?  Alcohol/week: 0.0 standard drinks  ?  Comment: once a month  ? Drug use: No  ? Sexual activity: Not on file  ?Other Topics Concern  ? Not on file  ?Social History Narrative  ? Married  ? Regular exercise- yes  ? ?Social Determinants of Health  ? ?Financial Resource Strain: Not on file  ?Food  Insecurity: Not on file  ?Transportation Needs: Not on file  ?Physical Activity: Not on file  ?Stress: Not on file  ?Social Connections: Not on file  ?Intimate Partner Violence: Not on file  ? ? ?Family History  ?Problem Relation Age of Onset  ? Alzheimer's disease Mother   ? Prostate cancer Father   ? Colon cancer Maternal Uncle 75  ? Alcohol abuse Other   ? Arthritis Other   ? Breast cancer Other   ? Ovarian cancer Other   ? Prostate cancer Other   ? Stroke Other   ? Esophageal cancer Neg Hx   ? Rectal cancer Neg Hx   ? Stomach cancer Neg Hx   ? ? ?Review of Systems:  As stated in the HPI and otherwise negative.  ? ?BP (!) 150/72   Pulse 78   Ht '5\' 8"'$  (1.727 m)   Wt 193 lb (87.5 kg)   SpO2 99%   BMI 29.35 kg/m?  ? ?Physical Examination: ?General: Well developed, well nourished, NAD  ?HEENT: OP clear, mucus membranes moist  ?SKIN: warm, dry. No  rashes. ?Neuro: No focal deficits  ?Musculoskeletal: Muscle strength 5/5 all ext  ?Psychiatric: Mood and affect normal  ?Neck: No JVD, no carotid bruits, no thyromegaly, no lymphadenopathy.  ?Lungs:Clear bilaterally, no wheezes, rhonci, crackles ?Cardiovascular: Regular rate and rhythm. Soft diastolic murmur RUSB.  ?Abdomen:Soft. Bowel sounds present. Non-tender.  ?Extremities: No lower extremity edema. Pulses are 2 + in the bilateral DP/PT. ? ?EKG:  EKG is not ordered today. ?The ekg ordered today demonstrates  ? ?Echo 12/12/20: ? 1. Left ventricular ejection fraction, by estimation, is 55 to 60%. Left  ?ventricular ejection fraction by 3D volume is 56 %. The left ventricle has  ?normal function. The left ventricle has no regional wall motion  ?abnormalities. There is moderate  ?hypertrophy of the basal septum. The rest of the LV segments demonstrate  ?mild left ventricular hypertrophy. Left ventricular diastolic parameters  ?are consistent with Grade I diastolic dysfunction (impaired relaxation).  ?The average left ventricular global  ? longitudinal strain is 24.5 %. The global longitudinal strain is normal.  ? 2. Right ventricular systolic function is normal. The right ventricular  ?size is normal. There is mildly elevated pulmonary artery systolic  ?pressure. The estimated right ventricular systolic pressure is 20.2 mmHg.  ? 3. Left atrial size was mildly dilated.  ? 4. The mitral valve is normal in structure. Trivial mitral valve  ?regurgitation. No evidence of mitral stenosis.  ? 5. The aortic valve is tricuspid. There is mild calcification of the  ?aortic valve. There is mild thickening of the aortic valve. Aortic valve  ?regurgitation is moderate. PHT 406. Aortic valve sclerosis/calcification  ?is present, without any evidence of  ?aortic stenosis.  ? 6. Aortic dilatation noted. There is mild dilatation of the aortic root,  ?measuring 39 mm. There is mild dilatation of the ascending aorta,  ?measuring 41 mm.   ? 7. The inferior vena cava is normal in size with greater than 50%  ?respiratory variability, suggesting right atrial pressure of 3 mmHg.  ? ?Recent Labs: ?11/19/2020: ALT 12; Hemoglobin 14.8; Platelets 177.0; TSH 1.08 ?01/14/2021: BUN 12; Creatinine, Ser 0.97; Potassium 4.0; Sodium 137  ? ?Lipid Panel ?   ?Component Value Date/Time  ? CHOL 167 03/09/2020 0844  ? TRIG 59.0 03/09/2020 0844  ? HDL 62.70 03/09/2020 0844  ? CHOLHDL 3 03/09/2020 0844  ? VLDL 11.8 03/09/2020 0844  ? [place] 92 03/09/2020 0844  ? ?  ?  Wt Readings from Last 3 Encounters:  ?03/26/21 193 lb (87.5 kg)  ?01/14/21 195 lb 9.6 oz (88.7 kg)  ?11/19/20 192 lb 4 oz (87.2 kg)  ?  ? ? ?Assessment and Plan:  ? ?Dyspnea on exertion: His dyspnea is not felt to be related to his AI. He has no evidence of CAD on coronary CTA January 2023. No volume overload on exam. Dyspnea is now improved.    ? ?Moderate aortic insufficiency: Will repeat echo in December 2023.  ? ? ?Current medicines are reviewed at length with the patient today.  The patient does not have concerns regarding medicines. ? ?The following changes have been made:  no change ? ?Labs/ tests ordered today include:  ? ?Orders Placed This Encounter  ?Procedures  ? ECHOCARDIOGRAM COMPLETE  ? ? ? ?Disposition:   F/U with me in 12 months ? ?Signed, ?Lauree Chandler, MD ?03/26/2021 3:37 PM    ?[facility] ?[address], [place], [place]  [address] ?Phone: [phone]; Fax: [phone]  ? ? ?

## 2021-04-11 ENCOUNTER — Ambulatory Visit (INDEPENDENT_AMBULATORY_CARE_PROVIDER_SITE_OTHER): Payer: Medicare HMO

## 2021-04-11 VITALS — BP 128/64 | HR 67 | Temp 97.5°F | Ht 68.0 in | Wt 191.8 lb

## 2021-04-11 DIAGNOSIS — Z0000 Encounter for general adult medical examination without abnormal findings: Secondary | ICD-10-CM | POA: Diagnosis not present

## 2021-04-11 NOTE — Progress Notes (Signed)
? ?Subjective:  ? Matthew [name] is a 71 y.o. male who presents for Medicare Annual/Subsequent preventive examination. ? ?Review of Systems    ? ?   ?Objective:  ?  ?Today's Vitals  ? 04/11/21 1023  ?BP: 128/64  ?Pulse: 67  ?Temp: (!) 97.5 ?F (36.4 ?C)  ?TempSrc: Oral  ?SpO2: 96%  ?Weight: 191 lb 12.8 oz (87 kg)  ?Height: '5\' 8"'$  (1.727 m)  ? ?Body mass index is 29.16 kg/m?. ? ? ?  04/11/2021  ? 10:41 AM 02/08/2016  ?  8:31 AM 08/08/2015  ? 11:14 AM 11/08/2013  ?  8:43 AM  ?Advanced Directives  ?Does Patient Have a Medical Advance Directive? Yes No No Yes  ?Type of Paramedic of [place];Living will   Living will;Healthcare Power of Attorney  ?Does patient want to make changes to medical advance directive? No - Patient declined     ?Copy of [place] in Chart? No - copy requested     ?Would patient like information on creating a medical advance directive?   No - patient declined information   ? ? ? ?Current Medications (verified) ?Outpatient Encounter Medications as of 04/11/2021  ?Medication Sig  ? aspirin 81 MG tablet Take 81 mg by mouth daily.  ? b complex vitamins tablet Take 1 tablet by mouth daily.  ? Cholecalciferol (VITAMIN D PO) Take by mouth daily.  ? ketoconazole (NIZORAL) 2 % cream Apply 1 application topically 2 (two) times daily as needed for irritation.  ? vitamin A 8000 UNIT capsule Take 8,000 Units by mouth daily.  ? vitamin C (ASCORBIC ACID) 500 MG tablet Take 500 mg by mouth daily.  ? vitamin E 600 UNIT capsule Take 600 Units by mouth daily.  ? ?No facility-administered encounter medications on file as of 04/11/2021.  ? ? ?Allergies (verified) ?Ciprofloxacin  ? ?History: ?Past Medical History:  ?Diagnosis Date  ? Asthma   ? as child  ? Cancer [facility])   ? prostate   ? Cataract   ? ED (erectile dysfunction)   ? Fracture   ? rt ankle  ? Heart murmur   ? Hemorrhoids   ? History of colonic polyps   ? History of prostate cancer   ? see's Dr. Janice Norrie  ? ?Past Surgical History:   ?Procedure Laterality Date  ? ANKLE FRACTURE SURGERY Left 2001  ? CATARACT EXTRACTION W/ INTRAOCULAR LENS IMPLANT Bilateral   ? per Dr. Lucita Ferrara   ? COLONOSCOPY  11/18/2018  ? per Dr. Hilarie Fredrickson, adenomatous polyps, repeat in 7 yrs  ? [place]  ? PROSTATECTOMY  2005  ? PROSTATECTOMY  1996  ? REFRACTIVE SURGERY Right 11/07/2013  ? RETINAL TEAR REPAIR CRYOTHERAPY Bilateral   ? per Dr. Tempie Hoist  ? rt bunion removed Left 1965  ? ?Family History  ?Problem Relation Age of Onset  ? Alzheimer's disease Mother   ? Prostate cancer Father   ? Colon cancer Maternal Uncle 75  ? Alcohol abuse Other   ? Arthritis Other   ? Breast cancer Other   ? Ovarian cancer Other   ? Prostate cancer Other   ? Stroke Other   ? Esophageal cancer Neg Hx   ? Rectal cancer Neg Hx   ? Stomach cancer Neg Hx   ? ?Social History  ? ?Socioeconomic History  ? Marital status: Married  ?  Spouse name: Not on file  ? Number of children: 3  ? Years of education: Not  on file  ? Highest education level: Not on file  ?Occupational History  ? Occupation: Retired  ?Tobacco Use  ? Smoking status: Never  ? Smokeless tobacco: Never  ?Substance and Sexual Activity  ? Alcohol use: Yes  ?  Alcohol/week: 0.0 standard drinks  ?  Comment: once a month  ? Drug use: No  ? Sexual activity: Not on file  ?Other Topics Concern  ? Not on file  ?Social History Narrative  ? Married  ? Regular exercise- yes  ? ?Social Determinants of Health  ? ?Financial Resource Strain: Low Risk   ? Difficulty of Paying Living Expenses: Not hard at all  ?Food Insecurity: No Food Insecurity  ? Worried About Charity fundraiser in the Last Year: Never true  ? Ran Out of Food in the Last Year: Never true  ?Transportation Needs: No Transportation Needs  ? Lack of Transportation (Medical): No  ? Lack of Transportation (Non-Medical): No  ?Physical Activity: Sufficiently Active  ? Days of Exercise per Week: 5 days  ? Minutes of Exercise per Session: 40 min  ?Stress: No Stress Concern  Present  ? Feeling of Stress : Only a little  ?Social Connections: Socially Integrated  ? Frequency of Communication with Friends and Family: More than three times a week  ? Frequency of Social Gatherings with Friends and Family: Once a week  ? Attends Religious Services: More than 4 times per year  ? Active Member of Clubs or Organizations: Yes  ? Attends Archivist Meetings: More than 4 times per year  ? Marital Status: Married  ? ? ?Clinical Intake: ? ? ?How often do you need to have someone help you when you read instructions, pamphlets, or other written materials from your doctor or pharmacy?: 1 - Never ? ?Diabetic?  No ? ?Activities of Daily Living ? ?  04/11/2021  ? 10:39 AM 04/08/2021  ?  7:12 AM  ?In your present state of health, do you have any difficulty performing the following activities:  ?Hearing? 0 0  ?Vision? 0 0  ?Difficulty concentrating or making decisions? 0 0  ?Walking or climbing stairs? 0 0  ?Dressing or bathing? 0 0  ?Doing errands, shopping? 0 0  ?Preparing Food and eating ? N N  ?Using the Toilet? N N  ?In the past six months, have you accidently leaked urine? N N  ?Do you have problems with loss of bowel control? N N  ?Managing your Medications? N N  ?Managing your Finances? N N  ?Housekeeping or managing your Housekeeping? N N  ? ? ?Patient Care Team: ?Laurey Morale, MD as PCP - General ?Burnell Blanks, MD as PCP - Cardiology (Cardiology) ? ?Indicate any recent Medical Services you may have received from other than [facility] providers in the past year (date may be approximate). ? ?   ?Assessment:  ? This is a routine wellness examination for Matthew [name]. ? ?Hearing/Vision screen ?Hearing Screening - Comments:: No hearing difficulty ?Vision Screening - Comments:: No vision difficulty. Followed by Dr Sabra Heck ? ?Dietary issues and exercise activities discussed: ?Exercise limited by: None identified ? ? Goals Addressed   ? ?  ?  ?  ?  ?  ? This Visit's Progress  ?   Increase physical  activity (pt-stated)     ?   I would like to lose about 15 lb ?  ? ?  ? ?Depression Screen ? ?  04/11/2021  ? 10:36 AM 11/19/2020  ? 12:01  PM 11/06/2016  ?  8:19 AM  ?PHQ 2/9 Scores  ?PHQ - 2 Score 0 0 0  ?PHQ- 9 Score  3   ?  ?Fall Risk ? ?  04/11/2021  ? 10:39 AM 04/08/2021  ?  7:12 AM 11/19/2020  ?  8:30 AM 02/09/2020  ?  9:18 AM 11/06/2016  ?  8:19 AM  ?Fall Risk   ?Falls in the past year? 0 0 0 0 No  ?Number falls in past yr: 0 0 0    ?Injury with Fall? 0 0 0    ?Risk for fall due to : No Fall Risks  No Fall Risks    ? ? ?FALL RISK PREVENTION PERTAINING TO THE HOME: ? ?Any stairs in or around the home? Yes  ?If so, are there any without handrails? No  ?Home free of loose throw rugs in walkways, pet beds, electrical cords, etc? Yes  ?Adequate lighting in your home to reduce risk of falls? Yes  ? ?ASSISTIVE DEVICES UTILIZED TO PREVENT FALLS: ? ?Life alert? No  ?Use of a cane, walker or w/c? No  ?Grab bars in the bathroom? Yes  ?Shower chair or bench in shower? No  ?Elevated toilet seat or a handicapped toilet? Yes  ? ?TIMED UP AND GO: ? ?Was the test performed? Yes .  ?Length of time to ambulate 10 feet: 5 sec.  ? ?Gait steady and fast without use of assistive device ? ?Cognitive Function: ?  ?  ? ?  04/11/2021  ? 10:41 AM  ?6CIT Screen  ?What Year? 0 points  ?What month? 0 points  ?What time? 0 points  ?Count back from 20 0 points  ?Months in reverse 0 points  ?Repeat phrase 0 points  ?Total Score 0 points  ? ? ?Immunizations ?Immunization History  ?Administered Date(s) Administered  ? Fluad Quad(high Dose 65+) 10/11/2018  ? Influenza Split 11/11/2011  ? Influenza Whole 11/17/2009  ? Influenza, High Dose Seasonal PF 11/06/2016, 11/06/2017  ? Influenza,inj,Quad PF,6+ Mos 10/26/2012, 11/01/2013, 11/01/2014  ? Influenza-Unspecified 11/17/2015, 03/02/2019  ? PFIZER(Purple Top)SARS-COV-2 Vaccination 11/11/2019  ? Pension scheme manager 8yr & up 09/25/2020  ? Pneumococcal Conjugate-13 11/06/2016  ?  Pneumococcal Polysaccharide-23 11/06/2017  ? Tdap 01/07/2011  ? ? ?TDAP status: Due, Education has been provided regarding the importance of this vaccine. [facility]vised [facility]

## 2021-04-11 NOTE — Patient Instructions (Addendum)
?[name] , ?Thank you for taking time to come for your Medicare Wellness Visit. I appreciate your ongoing commitment to your health goals. Please review the following plan we discussed and let me know if I can assist you in the future.  ? ?These are the goals we discussed: ? Goals   ? ?   Increase physical activity (pt-stated)   ?   I would like to lose about 15 lb ?  ? ?  ?  ?This is a list of the screening recommended for you and due dates:  ?Health Maintenance  ?Topic Date Due  ? COVID-19 Vaccine (3 - Pfizer risk series) 04/27/2021*  ? Zoster (Shingles) Vaccine (1 of 2) 07/11/2021*  ? Tetanus Vaccine  04/12/2022*  ? Hepatitis C Screening: USPSTF Recommendation to screen - Ages 18-79 yo.  04/12/2022*  ? Flu Shot  08/06/2021  ? Colon Cancer Screening  11/17/2025  ? Pneumonia Vaccine  Completed  ? HPV Vaccine  Aged Out  ?*Topic was postponed. The date shown is not the original due date.  ? ?Advanced directives: Yes Patient will bring copy ? ?Conditions/risks identified: None ? ?Next appointment: Follow up in one year for your annual wellness visit.  ? ?Preventive Care 92 Years and Older, Male ?Preventive care refers to lifestyle choices and visits with your health care provider that can promote health and wellness. ?What does preventive care include? ?A yearly physical exam. This is also called an annual well check. ?Dental exams once or twice a year. ?Routine eye exams. Ask your health care provider how often you should have your eyes checked. ?Personal lifestyle choices, including: ?Daily care of your teeth and gums. ?Regular physical activity. ?Eating a healthy diet. ?Avoiding tobacco and drug use. ?Limiting alcohol use. ?Practicing safe sex. ?Taking low doses of aspirin every day. ?Taking vitamin and mineral supplements as recommended by your health care provider. ?What happens during an annual well check? ?The services and screenings done by your health care provider during your annual well check will  depend on your age, overall health, lifestyle risk factors, and family history of disease. ?Counseling  ?Your health care provider may ask you questions about your: ?Alcohol use. ?Tobacco use. ?Drug use. ?Emotional well-being. ?Home and relationship well-being. ?Sexual activity. ?Eating habits. ?History of falls. ?Memory and ability to understand (cognition). ?Work and work Statistician. ?Screening  ?You may have the following tests or measurements: ?Height, weight, and BMI. ?Blood pressure. ?Lipid and cholesterol levels. These may be checked every 5 years, or more frequently if you are over 50 years old. ?Skin check. ?Lung cancer screening. You may have this screening every year starting at age 72 if you have a 30-pack-year history of smoking and currently smoke or have quit within the past 15 years. ?Fecal occult blood test (FOBT) of the stool. You may have this test every year starting at age 38. ?Flexible sigmoidoscopy or colonoscopy. You may have a sigmoidoscopy every 5 years or a colonoscopy every 10 years starting at age 46. ?Prostate cancer screening. Recommendations will vary depending on your family history and other risks. ?Hepatitis C blood test. ?Hepatitis B blood test. ?Sexually transmitted disease (STD) testing. ?Diabetes screening. This is done by checking your blood sugar (glucose) after you have not eaten for a while (fasting). You may have this done every 1-3 years. ?Abdominal aortic aneurysm (AAA) screening. You may need this if you are a current or former smoker. ?Osteoporosis. You may be screened starting at age 43 if you are  at high risk. ?Talk with your health care provider about your test results, treatment options, and if necessary, the need for more tests. ?Vaccines  ?Your health care provider may recommend certain vaccines, such as: ?Influenza vaccine. This is recommended every year. ?Tetanus, diphtheria, and acellular pertussis (Tdap, Td) vaccine. You may need a Td booster every 10  years. ?Zoster vaccine. You may need this after age 5. ?Pneumococcal 13-valent conjugate (PCV13) vaccine. One dose is recommended after age 49. ?Pneumococcal polysaccharide (PPSV23) vaccine. One dose is recommended after age 50. ?Talk to your health care provider about which screenings and vaccines you need and how often you need them. ?This information is not intended to replace advice given to you by your health care provider. Make sure you discuss any questions you have with your health care provider. ?Document Released: 01/19/2015 Document Revised: 09/12/2015 Document Reviewed: 10/24/2014 ?Elsevier Interactive Patient Education ? 2017 [employer]. ? ?Fall Prevention in the Home ?Falls can cause injuries. They can happen to people of all ages. There are many things you can do to make your home safe and to help prevent falls. ?What can I do on the outside of my home? ?Regularly fix the edges of walkways and driveways and fix any cracks. ?Remove anything that might make you trip as you walk through a door, such as a raised step or threshold. ?Trim any bushes or trees on the path to your home. ?Use bright outdoor lighting. ?Clear any walking paths of anything that might make someone trip, such as rocks or tools. ?Regularly check to see if handrails are loose or broken. Make sure that both sides of any steps have handrails. ?Any raised decks and porches should have guardrails on the edges. ?Have any leaves, snow, or ice cleared regularly. ?Use sand or salt on walking paths during winter. ?Clean up any spills in your garage right away. This includes oil or grease spills. ?What can I do in the bathroom? ?Use night lights. ?Install grab bars by the toilet and in the tub and shower. Do not use towel bars as grab bars. ?Use non-skid mats or decals in the tub or shower. ?If you need to sit down in the shower, use a plastic, non-slip stool. ?Keep the floor dry. Clean up any water that spills on the floor as soon as it  happens. ?Remove soap buildup in the tub or shower regularly. ?Attach bath mats securely with double-sided non-slip rug tape. ?Do not have throw rugs and other things on the floor that can make you trip. ?What can I do in the bedroom? ?Use night lights. ?Make sure that you have a light by your bed that is easy to reach. ?Do not use any sheets or blankets that are too big for your bed. They should not hang down onto the floor. ?Have a firm chair that has side arms. You can use this for support while you get dressed. ?Do not have throw rugs and other things on the floor that can make you trip. ?What can I do in the kitchen? ?Clean up any spills right away. ?Avoid walking on wet floors. ?Keep items that you use a lot in easy-to-reach places. ?If you need to reach something above you, use a strong step stool that has a grab bar. ?Keep electrical cords out of the way. ?Do not use floor polish or wax that makes floors slippery. If you must use wax, use non-skid floor wax. ?Do not have throw rugs and other things on the floor  that can make you trip. ?What can I do with my stairs? ?Do not leave any items on the stairs. ?Make sure that there are handrails on both sides of the stairs and use them. Fix handrails that are broken or loose. Make sure that handrails are as long as the stairways. ?Check any carpeting to make sure that it is firmly attached to the stairs. Fix any carpet that is loose or worn. ?Avoid having throw rugs at the top or bottom of the stairs. If you do have throw rugs, attach them to the floor with carpet tape. ?Make sure that you have a light switch at the top of the stairs and the bottom of the stairs. If you do not have them, ask someone to add them for you. ?What else can I do to help prevent falls? ?Wear shoes that: ?Do not have high heels. ?Have rubber bottoms. ?Are comfortable and fit you well. ?Are closed at the toe. Do not wear sandals. ?If you use a stepladder: ?Make sure that it is fully opened.  Do not climb a closed stepladder. ?Make sure that both sides of the stepladder are locked into place. ?Ask someone to hold it for you, if possible. ?Clearly mark and make sure that you can see: ?Any grab bars or

## 2021-07-26 DIAGNOSIS — Z8249 Family history of ischemic heart disease and other diseases of the circulatory system: Secondary | ICD-10-CM | POA: Diagnosis not present

## 2021-07-26 DIAGNOSIS — Z8546 Personal history of malignant neoplasm of prostate: Secondary | ICD-10-CM | POA: Diagnosis not present

## 2021-07-26 DIAGNOSIS — R030 Elevated blood-pressure reading, without diagnosis of hypertension: Secondary | ICD-10-CM | POA: Diagnosis not present

## 2021-07-26 DIAGNOSIS — Z8042 Family history of malignant neoplasm of prostate: Secondary | ICD-10-CM | POA: Diagnosis not present

## 2021-07-26 DIAGNOSIS — Z7982 Long term (current) use of aspirin: Secondary | ICD-10-CM | POA: Diagnosis not present

## 2021-07-26 DIAGNOSIS — N529 Male erectile dysfunction, unspecified: Secondary | ICD-10-CM | POA: Diagnosis not present

## 2021-09-11 ENCOUNTER — Encounter: Payer: Medicare HMO | Admitting: Family Medicine

## 2021-09-17 ENCOUNTER — Ambulatory Visit (INDEPENDENT_AMBULATORY_CARE_PROVIDER_SITE_OTHER): Payer: Medicare HMO | Admitting: Family Medicine

## 2021-09-17 ENCOUNTER — Encounter: Payer: Self-pay | Admitting: Family Medicine

## 2021-09-17 VITALS — BP 120/78 | HR 59 | Temp 97.6°F | Ht 67.5 in | Wt 185.0 lb

## 2021-09-17 DIAGNOSIS — R739 Hyperglycemia, unspecified: Secondary | ICD-10-CM

## 2021-09-17 DIAGNOSIS — Z23 Encounter for immunization: Secondary | ICD-10-CM

## 2021-09-17 DIAGNOSIS — M1A9XX0 Chronic gout, unspecified, without tophus (tophi): Secondary | ICD-10-CM

## 2021-09-17 DIAGNOSIS — J4520 Mild intermittent asthma, uncomplicated: Secondary | ICD-10-CM

## 2021-09-17 DIAGNOSIS — E785 Hyperlipidemia, unspecified: Secondary | ICD-10-CM

## 2021-09-17 DIAGNOSIS — Z8546 Personal history of malignant neoplasm of prostate: Secondary | ICD-10-CM

## 2021-09-17 LAB — BASIC METABOLIC PANEL
BUN: 13 mg/dL (ref 6–23)
CO2: 31 mEq/L (ref 19–32)
Calcium: 9.5 mg/dL (ref 8.4–10.5)
Chloride: 101 mEq/L (ref 96–112)
Creatinine, Ser: 0.89 mg/dL (ref 0.40–1.50)
GFR: 86.48 mL/min (ref >60.00)
Glucose, Bld: 87 mg/dL (ref 70–99)
Potassium: 4.5 mEq/L (ref 3.5–5.1)
Sodium: 140 mEq/L (ref 135–145)

## 2021-09-17 LAB — CBC WITH DIFFERENTIAL/PLATELET
Basophils Absolute: 0 10*3/uL (ref 0.0–0.1)
Basophils Relative: 1.1 % (ref 0.0–3.0)
Eosinophils Absolute: 0.1 10*3/uL (ref 0.0–0.7)
Eosinophils Relative: 2.7 % (ref 0.0–5.0)
HCT: 44.3 % (ref 39.0–52.0)
Hemoglobin: 14.8 g/dL (ref 13.0–17.0)
Lymphocytes Relative: 49.3 % — ABNORMAL HIGH (ref 12.0–46.0)
Lymphs Abs: 2 10*3/uL (ref 0.7–4.0)
MCHC: 33.4 g/dL (ref 30.0–36.0)
MCV: 91.8 fl (ref 78.0–100.0)
Monocytes Absolute: 0.4 10*3/uL (ref 0.1–1.0)
Monocytes Relative: 9.7 % (ref 3.0–12.0)
Neutro Abs: 1.5 10*3/uL (ref 1.4–7.7)
Neutrophils Relative %: 37.2 % — ABNORMAL LOW (ref 43.0–77.0)
Platelets: 203 10*3/uL (ref 150.0–400.0)
RBC: 4.83 Mil/uL (ref 4.22–5.81)
RDW: 14.1 % (ref 11.5–15.5)
WBC: 4.1 10*3/uL (ref 4.0–10.5)

## 2021-09-17 LAB — HEPATIC FUNCTION PANEL
ALT: 10 U/L (ref 0–53)
AST: 17 U/L (ref 0–37)
Albumin: 4.3 g/dL (ref 3.5–5.2)
Alkaline Phosphatase: 68 U/L (ref 39–117)
Bilirubin, Direct: 0.1 mg/dL (ref 0.0–0.3)
Total Bilirubin: 0.7 mg/dL (ref 0.2–1.2)
Total Protein: 7.3 g/dL (ref 6.0–8.3)

## 2021-09-17 LAB — LIPID PANEL
Cholesterol: 168 mg/dL (ref 0–200)
HDL: 64.5 mg/dL (ref >39.00)
LDL Cholesterol: 88 mg/dL (ref 0–99)
NonHDL: 103.97
Total CHOL/HDL Ratio: 3
Triglycerides: 80 mg/dL (ref 0.0–149.0)
VLDL: 16 mg/dL (ref 0.0–40.0)

## 2021-09-17 LAB — PSA: PSA: 0 ng/mL — ABNORMAL LOW (ref 0.10–4.00)

## 2021-09-17 LAB — HEMOGLOBIN A1C: Hgb A1c MFr Bld: 5.6 % (ref 4.6–6.5)

## 2021-09-17 LAB — TSH: TSH: 1.58 u[IU]/mL (ref 0.35–5.50)

## 2021-09-17 NOTE — Progress Notes (Signed)
Subjective:    Patient ID: Matthew [name], male    DOB: [birth year], 71 y.o.   MRN: [id number]  HPI Here to follow up on issues. He feels well and has no concerns. He sees Dr. Janice Norrie for prostate exams but he asks [place] to check a PSA today. He has not had a gout flare for years now. His asthma is stable. He eats a healthy diet and he either walks or rides an eliptical bike 5 days a week.    Review of Systems  Constitutional: Negative.   HENT: Negative.    Eyes: Negative.   Respiratory: Negative.    Cardiovascular: Negative.   Gastrointestinal: Negative.   Genitourinary: Negative.   Musculoskeletal: Negative.   Skin: Negative.   Neurological: Negative.   Psychiatric/Behavioral: Negative.         Objective:   Physical Exam Constitutional:      General: He is not in acute distress.    Appearance: Normal appearance. He is well-developed. He is not diaphoretic.  HENT:     Head: Normocephalic and atraumatic.     Right Ear: External ear normal.     Left Ear: External ear normal.     Nose: Nose normal.     Mouth/Throat:     Pharynx: No oropharyngeal exudate.  Eyes:     General: No scleral icterus.       Right eye: No discharge.        Left eye: No discharge.     Conjunctiva/sclera: Conjunctivae normal.     Pupils: Pupils are equal, round, and reactive to light.  Neck:     Thyroid: No thyromegaly.     Vascular: No JVD.     Trachea: No tracheal deviation.  Cardiovascular:     Rate and Rhythm: Normal rate and regular rhythm.     Heart sounds: Normal heart sounds. No murmur heard.    No friction rub. No gallop.  Pulmonary:     Effort: Pulmonary effort is normal. No respiratory distress.     Breath sounds: Normal breath sounds. No wheezing or rales.  Chest:     Chest wall: No tenderness.  Abdominal:     General: Bowel sounds are normal. There is no distension.     Palpations: Abdomen is soft. There is no mass.     Tenderness: There is no abdominal tenderness. There is no  guarding or rebound.  Genitourinary:    Penis: Normal. No tenderness.      Testes: Normal.     Prostate: Normal.     Rectum: Normal. Guaiac result negative.  Musculoskeletal:        General: No tenderness. Normal range of motion.     Cervical back: Neck supple.  Lymphadenopathy:     Cervical: No cervical adenopathy.  Skin:    General: Skin is warm and dry.     Coloration: Skin is not pale.     Findings: No erythema or rash.  Neurological:     Mental Status: He is alert and oriented to person, place, and time.     Cranial Nerves: No cranial nerve deficit.     Motor: No abnormal muscle tone.     Coordination: Coordination normal.     Deep Tendon Reflexes: Reflexes are normal and symmetric. Reflexes normal.  Psychiatric:        Behavior: Behavior normal.        Thought Content: Thought content normal.        Judgment: Judgment normal.  Assessment & Plan:  His asthma and gout are stable. We will get fasting labs today including lipids, A1c, and PSA. He is given a flu shot. We spent a total of ( 32  ) minutes reviewing records and discussing these issues.  Alysia Penna, MD

## 2021-09-17 NOTE — Addendum Note (Signed)
Addended by: Wyvonne Lenz on: 09/17/2021 09:47 AM   Modules accepted: Orders

## 2021-10-01 DIAGNOSIS — H52223 Regular astigmatism, bilateral: Secondary | ICD-10-CM | POA: Diagnosis not present

## 2021-10-01 DIAGNOSIS — H59811 Chorioretinal scars after surgery for detachment, right eye: Secondary | ICD-10-CM | POA: Diagnosis not present

## 2021-10-01 DIAGNOSIS — H5211 Myopia, right eye: Secondary | ICD-10-CM | POA: Diagnosis not present

## 2021-10-01 DIAGNOSIS — H35432 Paving stone degeneration of retina, left eye: Secondary | ICD-10-CM | POA: Diagnosis not present

## 2021-10-01 DIAGNOSIS — H33003 Unspecified retinal detachment with retinal break, bilateral: Secondary | ICD-10-CM | POA: Diagnosis not present

## 2021-10-01 DIAGNOSIS — H43393 Other vitreous opacities, bilateral: Secondary | ICD-10-CM | POA: Diagnosis not present

## 2021-10-01 DIAGNOSIS — H35371 Puckering of macula, right eye: Secondary | ICD-10-CM | POA: Diagnosis not present

## 2021-10-01 DIAGNOSIS — Z961 Presence of intraocular lens: Secondary | ICD-10-CM | POA: Diagnosis not present

## 2021-10-01 DIAGNOSIS — H5202 Hypermetropia, left eye: Secondary | ICD-10-CM | POA: Diagnosis not present

## 2021-10-01 DIAGNOSIS — H524 Presbyopia: Secondary | ICD-10-CM | POA: Diagnosis not present

## 2021-10-01 DIAGNOSIS — H338 Other retinal detachments: Secondary | ICD-10-CM | POA: Diagnosis not present

## 2021-10-07 DIAGNOSIS — Z0100 Encounter for examination of eyes and vision without abnormal findings: Secondary | ICD-10-CM | POA: Diagnosis not present

## 2021-10-15 ENCOUNTER — Ambulatory Visit (INDEPENDENT_AMBULATORY_CARE_PROVIDER_SITE_OTHER): Payer: Medicare HMO | Admitting: Family Medicine

## 2021-10-15 ENCOUNTER — Ambulatory Visit (INDEPENDENT_AMBULATORY_CARE_PROVIDER_SITE_OTHER): Payer: Medicare HMO

## 2021-10-15 ENCOUNTER — Encounter: Payer: Self-pay | Admitting: Family Medicine

## 2021-10-15 VITALS — BP 130/86 | HR 64 | Temp 97.6°F | Wt 189.0 lb

## 2021-10-15 DIAGNOSIS — M545 Low back pain, unspecified: Secondary | ICD-10-CM

## 2021-10-15 DIAGNOSIS — M5136 Other intervertebral disc degeneration, lumbar region: Secondary | ICD-10-CM | POA: Diagnosis not present

## 2021-10-15 MED ORDER — METHYLPREDNISOLONE 4 MG PO TBPK: ORAL_TABLET | ORAL | 0 refills | Status: DC

## 2021-10-15 MED ORDER — CYCLOBENZAPRINE HCL 10 MG PO TABS: 10.0000 mg | ORAL_TABLET | Freq: Three times a day (TID) | ORAL | 0 refills | Status: DC | PRN

## 2021-10-15 NOTE — Progress Notes (Signed)
   Subjective:    Patient ID: Matthew [name], male    DOB: 06-19-1950, 71 y.o.   MRN: [id number]  HPI Here for low back pain that started 5 days ago when he fell at home. He was standing on a ledge about 3 feet off the ground and he lost his balance. He fell backwards onto his back and the back of his head. No LOC. His head has been fine, but he has had some low back pain since then. The pain does not radiate to the legs. Using heat and Aleve have helped a little.    Review of Systems  Constitutional: Negative.   Respiratory: Negative.    Cardiovascular: Negative.   Musculoskeletal:  Positive for back pain.  Neurological: Negative.        Objective:   Physical Exam Constitutional:      Comments: In pain, walks slowly and stiffly   Cardiovascular:     Rate and Rhythm: Normal rate and regular rhythm.     Pulses: Normal pulses.     Heart sounds: Normal heart sounds.  Pulmonary:     Effort: Pulmonary effort is normal.     Breath sounds: Normal breath sounds.  Musculoskeletal:     Comments: The lower back is not tender, but there is a fair amount of spasm in the lower back. ROM is full. SLR are positive on both sides   Neurological:     General: No focal deficit present.     Mental Status: He is alert and oriented to person, place, and time.           Assessment & Plan:  Lumbar pain after a fall. We will treat this with Flexeril and a Medrol dose pack. Get Xrays of the lumbar spine to rule out vertebral fractures.  Alysia Penna, MD

## 2021-10-18 MED ORDER — DOCUSATE SODIUM 100 MG PO CAPS: 100.0000 mg | ORAL_CAPSULE | Freq: Two times a day (BID) | ORAL | 11 refills | Status: DC

## 2021-10-18 NOTE — Addendum Note (Signed)
Addended by: Alysia Penna A on: 10/18/2021 04:01 PM   Modules accepted: Orders

## 2021-10-25 DIAGNOSIS — H5202 Hypermetropia, left eye: Secondary | ICD-10-CM | POA: Diagnosis not present

## 2021-10-25 DIAGNOSIS — H5211 Myopia, right eye: Secondary | ICD-10-CM | POA: Diagnosis not present

## 2021-10-25 DIAGNOSIS — H1132 Conjunctival hemorrhage, left eye: Secondary | ICD-10-CM | POA: Diagnosis not present

## 2021-10-25 DIAGNOSIS — H524 Presbyopia: Secondary | ICD-10-CM | POA: Diagnosis not present

## 2021-10-25 DIAGNOSIS — H52223 Regular astigmatism, bilateral: Secondary | ICD-10-CM | POA: Diagnosis not present

## 2021-10-30 DIAGNOSIS — N3943 Post-void dribbling: Secondary | ICD-10-CM | POA: Diagnosis not present

## 2021-10-30 DIAGNOSIS — Z8546 Personal history of malignant neoplasm of prostate: Secondary | ICD-10-CM | POA: Diagnosis not present

## 2021-10-30 DIAGNOSIS — N5201 Erectile dysfunction due to arterial insufficiency: Secondary | ICD-10-CM | POA: Diagnosis not present

## 2021-12-09 ENCOUNTER — Ambulatory Visit (HOSPITAL_COMMUNITY): Payer: Medicare HMO | Attending: Cardiovascular Disease

## 2021-12-09 DIAGNOSIS — I351 Nonrheumatic aortic (valve) insufficiency: Secondary | ICD-10-CM | POA: Diagnosis not present

## 2021-12-09 LAB — ECHOCARDIOGRAM COMPLETE
AR max vel: 1.67 cm2
AV Area VTI: 2.05 cm2
AV Area mean vel: 1.92 cm2
AV Mean grad: 10 mmHg
AV Peak grad: 21.2 mmHg
Ao pk vel: 2.3 m/s
Area-P 1/2: 2.37 cm2
P 1/2 time: 423 msec
S' Lateral: 3.8 cm

## 2021-12-24 ENCOUNTER — Encounter: Payer: Self-pay | Admitting: Family Medicine

## 2021-12-24 ENCOUNTER — Ambulatory Visit (INDEPENDENT_AMBULATORY_CARE_PROVIDER_SITE_OTHER): Payer: Medicare HMO

## 2021-12-24 ENCOUNTER — Ambulatory Visit (INDEPENDENT_AMBULATORY_CARE_PROVIDER_SITE_OTHER): Payer: Medicare HMO | Admitting: Family Medicine

## 2021-12-24 VITALS — BP 132/70 | HR 60 | Temp 97.7°F | Ht 67.5 in | Wt 192.5 lb

## 2021-12-24 DIAGNOSIS — M25521 Pain in right elbow: Secondary | ICD-10-CM

## 2021-12-24 MED ORDER — METHYLPREDNISOLONE 4 MG PO TBPK: ORAL_TABLET | ORAL | 0 refills | Status: DC

## 2021-12-24 MED ORDER — DICLOFENAC SODIUM 75 MG PO TBEC: 75.0000 mg | DELAYED_RELEASE_TABLET | Freq: Two times a day (BID) | ORAL | 0 refills | Status: DC

## 2021-12-24 NOTE — Progress Notes (Signed)
   Established Patient Office Visit  Subjective   Patient ID: KAISEI GILBO, male    DOB: 1950/04/30  Age: 71 y.o. MRN: [id number]  Chief Complaint  Patient presents with   Elbow Injury    Patient complains of right elbow pain since playing raquetteball last night    Patient reports he injured his right elbow. States he was playing racketball and  thinks that he either hit the elbow or he stressed it. States that the pain was severe by the next day. States he is having trouble with internal rotation of the elbow. States that there is point tenderness and some swelling of the elbow.       Review of Systems  All other systems reviewed and are negative.     Objective:     BP 132/70 (BP Location: Left Arm, Patient Position: Sitting, Cuff Size: Large)   Pulse 60   Temp 97.7 F (36.5 C) (Oral)   Ht 5' 7.5" (1.715 m)   Wt 192 lb 8 oz (87.3 kg)   SpO2 98%   BMI 29.70 kg/m    Physical Exam Musculoskeletal:     Right elbow: Swelling (olecranon bursal slight swelling, more prominent that the left) present. Tenderness (point tenderness in the medial epicondyle of the humerus) present.      No results found for any visits on 12/24/21.    The 10-year ASCVD risk score (Arnett DK, et al., 2019) is: 11.3%    Assessment & Plan:   Problem List Items Addressed This Visit   None Visit Diagnoses     Right elbow pain    -  Primary   Relevant Medications   Most likely the diagnosis is medial epicondylitis, there is point tenderness at the medial epicondyle, will check an elbow film to rule out any bony abnormalities. I recommended getting an elbow sleeve for compression, will use a medrol dose pak to immediately reduce inflammation. Pt was advised to rest the elbow, use ice in the first 48 hours, then switch to heat. Pt may start the diclofenac 75 mg BID PRN after the medrol dose pak is completed.   methylPREDNISolone (MEDROL DOSEPAK) 4 MG TBPK tablet   diclofenac (VOLTAREN) 75  MG EC tablet   Other Relevant Orders   DG Elbow Complete Right       No follow-ups on file.    Farrel Conners, MD

## 2021-12-25 ENCOUNTER — Telehealth: Payer: Self-pay | Admitting: Cardiovascular Disease

## 2021-12-25 NOTE — Telephone Encounter (Signed)
Pt called back wanting Echocardiogram results from Dr. Angelena Form.    Information shared with Pt:  His heart is strong but his aortic valve is leaking more and may need to be addressed soon with surgery. I would like to see him back in the office to discuss further testing. Dr. Estevan Ryder;  Pt's EF also shared.  Pt agrees with Dr. Angelena Form, and would like to come back in to discuss plan of care, and old echocardiogram vs current one, showing aortic valve leaking.    I will message Dr. Angelena Form RN, to make them aware Pt would like another appointment to see them in clinic.

## 2021-12-25 NOTE — Telephone Encounter (Signed)
Patient returned RN's call regarding results. 

## 2022-01-09 NOTE — Telephone Encounter (Signed)
Call placed to patient.  Scheduled follow up to discuss echo results for 02/05/22.

## 2022-02-03 DIAGNOSIS — R69 Illness, unspecified: Secondary | ICD-10-CM | POA: Diagnosis not present

## 2022-02-05 ENCOUNTER — Encounter: Payer: Self-pay | Admitting: Cardiovascular Disease

## 2022-02-05 ENCOUNTER — Ambulatory Visit: Payer: Medicare HMO | Attending: Cardiovascular Disease | Admitting: Cardiovascular Disease

## 2022-02-05 VITALS — BP 146/78 | HR 65 | Ht 67.5 in | Wt 190.0 lb

## 2022-02-05 DIAGNOSIS — I351 Nonrheumatic aortic (valve) insufficiency: Secondary | ICD-10-CM | POA: Diagnosis not present

## 2022-02-05 NOTE — Progress Notes (Signed)
Chief Complaint  Patient presents with   Follow-up    Aortic insufficiency   History of Present Illness: 72 yo Matthew [name] with history of prostate cancer and aortic valve insufficiency who is here today for follow up. I saw him as a new patient for the evaluation of dyspnea and fatigue in January 2023. He was seen in November 2022 by Matthew Matthew [name] and c/o dyspnea on exertion. Echo 12/12/20 with LVEF=55-60%. Moderate basal septal hypertrophy. Moderate aortic insufficiency. Mild dilation of the aortic root. Chest x-ray 11/19/20 with no active disease. Coronary CTA 01/23/21 with no evidence of CAD, calcium score 0. No evidence of dilated aortic root on CTA January 2023. Repeat echo December 2023 with LVEF=60-65% with severe aortic valve regurgitation.   He is here today for follow up. The patient denies any chest pain, dyspnea, palpitations, lower extremity edema, orthopnea, PND, dizziness, near syncope or syncope.    Primary Care Physician: Matthew Morale, MD   Past Medical History:  Diagnosis Date   Asthma    as child   Cancer [facility])    prostate    Cataract    ED (erectile dysfunction)    Fracture    rt ankle   Heart murmur    Hemorrhoids    History of colonic polyps    History of prostate cancer    see's Matthew Matthew [name]    Past Surgical History:  Procedure Laterality Date   ANKLE FRACTURE SURGERY Left 2001   CATARACT EXTRACTION W/ INTRAOCULAR LENS IMPLANT Bilateral    per Matthew Matthew [name]    COLONOSCOPY  11/18/2018   per Matthew Matthew [name], adenomatous polyps, repeat in 7 yrs   [place] Right 11/07/2013   RETINAL TEAR REPAIR CRYOTHERAPY Bilateral    per Matthew Matthew [name]   rt bunion removed Left 1965    Current Outpatient Medications  Medication Sig Dispense Refill   aspirin 81 MG tablet Take 81 mg by mouth daily.     b complex vitamins tablet Take 1 tablet by mouth daily.     Cholecalciferol (VITAMIN D PO) Take by  mouth daily.     diclofenac (VOLTAREN) 75 MG EC tablet Take 1 tablet (75 mg total) by mouth 2 (two) times daily. 30 tablet 0   methylPREDNISolone (MEDROL DOSEPAK) 4 MG TBPK tablet Take package as directed. 21 each 0   vitamin A 8000 UNIT capsule Take 8,000 Units by mouth daily.     vitamin C (ASCORBIC ACID) 500 MG tablet Take 500 mg by mouth daily.     vitamin E 600 UNIT capsule Take 600 Units by mouth daily.     No current facility-administered medications for this visit.    Allergies  Allergen Reactions   Ciprofloxacin     REACTION: headaches    Social History   Socioeconomic History   Marital status: Married    Spouse name: Not on file   Number of children: 3   Years of education: Not on file   Highest education level: Master's degree (e.g., MA, MS, MEng, MEd, MSW, MBA)  Occupational History   Occupation: Retired  Tobacco Use   Smoking status: Never   Smokeless tobacco: Never  Substance and Sexual Activity   Alcohol use: Yes    Alcohol/week: 0.0 standard drinks of alcohol    Comment: once a month   Drug use: No   Sexual activity: Not on file  Other  Topics Concern   Not on file  Social History Narrative   Married   Regular exercise- yes   Social Determinants of Health   Financial Resource Strain: Low Risk  (10/14/2021)   Overall Financial Resource Strain (CARDIA)    Difficulty of Paying Living Expenses: Not hard at all  Food Insecurity: No Food Insecurity (10/14/2021)   Hunger Vital Sign    Worried About Running Out of Food in the Last Year: Never true    Ran Out of Food in the Last Year: Never true  Transportation Needs: No Transportation Needs (10/14/2021)   PRAPARE - Hydrologist (Medical): No    Lack of Transportation (Non-Medical): No  Physical Activity: Sufficiently Active (10/14/2021)   Exercise Vital Sign    Days of Exercise per Week: 5 days    Minutes of Exercise per Session: 30 min  Stress: No Stress Concern Present  (10/14/2021)   [place]    Feeling of Stress : Not at all  Social Connections: [place] (10/14/2021)   Social Connection and Isolation Panel [NHANES]    Frequency of Communication with Friends and Family: More than three times a week    Frequency of Social Gatherings with Friends and Family: Once a week    Attends Religious Services: More than 4 times per year    Active Member of [employer] or Organizations: Yes    Attends Music therapist: More than 4 times per year    Marital Status: Married  Human resources officer Violence: Not At Risk (04/11/2021)   Humiliation, Afraid, Rape, and Kick questionnaire    Fear of Current or Ex-Partner: No    Emotionally Abused: No    Physically Abused: No    Sexually Abused: No    Family History  Problem Relation Age of Onset   Alzheimer's disease Mother    Prostate cancer Father    Colon cancer Maternal Uncle 75   Alcohol abuse Other    Arthritis Other    Breast cancer Other    Ovarian cancer Other    Prostate cancer Other    Stroke Other    Esophageal cancer Neg Hx    Rectal cancer Neg Hx    Stomach cancer Neg Hx     Review of Systems:  As stated in the HPI and otherwise negative.   BP (!) 146/78   Pulse 65   Ht 5' 7.5" (1.715 m)   Wt 86.2 kg   SpO2 100%   BMI 29.32 kg/m   Physical Examination: General: Well developed, well nourished, NAD  HEENT: OP clear, mucus membranes moist  SKIN: warm, dry. No rashes. Neuro: No focal deficits  Musculoskeletal: Muscle strength 5/5 all ext  Psychiatric: Mood and affect normal  Neck: No JVD, no carotid bruits, no thyromegaly, no lymphadenopathy.  Lungs:Clear bilaterally, no wheezes, rhonci, crackles Cardiovascular: Regular rate and rhythm. No murmurs, gallops or rubs. Abdomen:Soft. Bowel sounds present. Non-tender.  Extremities: No lower extremity edema. Pulses are 2 + in the bilateral DP/PT.  EKG:  EKG is  ordered today. The ekg ordered today demonstrates NSR, LVH, non-specific T wave abn  Echo December 2023:  1. The aortic valve is tricuspid. There is mild calcification of the  aortic valve. There is mild thickening of the aortic valve. Aortic valve  regurgitation is severe with diastolic flow reversal noted in the  descending aorta. Aortic valve  sclerosis/calcification is present, without any evidence of  aortic  stenosis. Elevated gradients across the aortic valve likely driven by  severe AR.   2. Left ventricular ejection fraction, by estimation, is 60 to 65%. The  left ventricle has normal function. The left ventricle has no regional  wall motion abnormalities. Left ventricular diastolic parameters are  consistent with Grade I diastolic  dysfunction (impaired relaxation).   3. Right ventricular systolic function is normal. The right ventricular  size is normal. Tricuspid regurgitation signal is inadequate for assessing  PA pressure.   4. Left atrial size was mildly dilated.   5. The mitral valve is grossly normal. Trivial mitral valve  regurgitation.   6. Pulmonic valve regurgitation is moderate.   7. Aortic dilatation noted. There is mild dilatation of the aortic root,  measuring 41 mm. There is mild dilatation of the ascending aorta,  measuring 42 mm.   8. The inferior vena cava is normal in size with greater than 50%  respiratory variability, suggesting right atrial pressure of 3 mmHg.   Recent Labs: 09/17/2021: ALT 10; BUN Matthew; Creatinine, Ser 0.89; Hemoglobin 14.8; Platelets 203.0; Potassium 4.5; Sodium 140; TSH 1.58   Lipid Panel    Component Value Date/Time   CHOL 168 09/17/2021 0934   TRIG 80.0 09/17/2021 0934   HDL 64.50 09/17/2021 0934   CHOLHDL 3 09/17/2021 0934   VLDL 16.0 09/17/2021 0934   LDLCALC 88 09/17/2021 0934     Wt Readings from Last 3 Encounters:  02/05/22 86.2 kg  12/24/21 87.3 kg  10/15/21 85.7 kg    Assessment and Plan:   Aortic valve  insufficiency: Severe AI by recent echo. He will need to have a surgical evaluation for AVR. I have encouraged him to think about proceeding soon. He wants to go home and discuss this more with his wife. She is with him today. When he calls back, we will arrange a right and left heart cath at [facility]. He will be referred to see CT surgery after his cath.   Labs/ tests ordered today include:  No orders of the defined types were placed in this encounter.  Disposition:   F/U with me in 12 months  25 minutes face to face time reviewing AI, AVR.   Signed, Lauree Chandler, MD 02/05/2022 4:45 PM    [facility] [place], [place], [place]  [address] Phone: [phone]; Fax: [phone]

## 2022-02-05 NOTE — H&P (View-Only) (Signed)
Chief Complaint  Patient presents with   Follow-up    Aortic insufficiency   History of Present Illness: 72 yo male with history of prostate cancer and aortic valve insufficiency who is here today for follow up. I saw him as a new patient for the evaluation of dyspnea and fatigue in January 2023. He was seen in November 2022 by Dr. Sharlene Motts and c/o dyspnea on exertion. Echo 12/12/20 with LVEF=55-60%. Moderate basal septal hypertrophy. Moderate aortic insufficiency. Mild dilation of the aortic root. Chest x-ray 11/19/20 with no active disease. Coronary CTA 01/23/21 with no evidence of CAD, calcium score 0. No evidence of dilated aortic root on CTA January 2023. Repeat echo December 2023 with LVEF=60-65% with severe aortic valve regurgitation.   He is here today for follow up. The patient denies any chest pain, dyspnea, palpitations, lower extremity edema, orthopnea, PND, dizziness, near syncope or syncope.    Primary Care Physician: Laurey Morale, MD   Past Medical History:  Diagnosis Date   Asthma    as child   Cancer [facility])    prostate    Cataract    ED (erectile dysfunction)    Fracture    rt ankle   Heart murmur    Hemorrhoids    History of colonic polyps    History of prostate cancer    see's Dr. Janice Norrie    Past Surgical History:  Procedure Laterality Date   ANKLE FRACTURE SURGERY Left 2001   CATARACT EXTRACTION W/ INTRAOCULAR LENS IMPLANT Bilateral    per Dr. Lucita Ferrara    COLONOSCOPY  11/18/2018   per Dr. Hilarie Fredrickson, adenomatous polyps, repeat in 7 yrs   [place] Right 11/07/2013   RETINAL TEAR REPAIR CRYOTHERAPY Bilateral    per Dr. Tempie Hoist   rt bunion removed Left 1965    Current Outpatient Medications  Medication Sig Dispense Refill   aspirin 81 MG tablet Take 81 mg by mouth daily.     b complex vitamins tablet Take 1 tablet by mouth daily.     Cholecalciferol (VITAMIN D PO) Take by  mouth daily.     diclofenac (VOLTAREN) 75 MG EC tablet Take 1 tablet (75 mg total) by mouth 2 (two) times daily. 30 tablet 0   methylPREDNISolone (MEDROL DOSEPAK) 4 MG TBPK tablet Take package as directed. 21 each 0   vitamin A 8000 UNIT capsule Take 8,000 Units by mouth daily.     vitamin C (ASCORBIC ACID) 500 MG tablet Take 500 mg by mouth daily.     vitamin E 600 UNIT capsule Take 600 Units by mouth daily.     No current facility-administered medications for this visit.    Allergies  Allergen Reactions   Ciprofloxacin     REACTION: headaches    Social History   Socioeconomic History   Marital status: Married    Spouse name: Not on file   Number of children: 3   Years of education: Not on file   Highest education level: Master's degree (e.g., MA, MS, MEng, MEd, MSW, MBA)  Occupational History   Occupation: Retired  Tobacco Use   Smoking status: Never   Smokeless tobacco: Never  Substance and Sexual Activity   Alcohol use: Yes    Alcohol/week: 0.0 standard drinks of alcohol    Comment: once a month   Drug use: No   Sexual activity: Not on file  Other  Topics Concern   Not on file  Social History Narrative   Married   Regular exercise- yes   Social Determinants of Health   Financial Resource Strain: Low Risk  (10/14/2021)   Overall Financial Resource Strain (CARDIA)    Difficulty of Paying Living Expenses: Not hard at all  Food Insecurity: No Food Insecurity (10/14/2021)   Hunger Vital Sign    Worried About Running Out of Food in the Last Year: Never true    Ran Out of Food in the Last Year: Never true  Transportation Needs: No Transportation Needs (10/14/2021)   PRAPARE - Hydrologist (Medical): No    Lack of Transportation (Non-Medical): No  Physical Activity: Sufficiently Active (10/14/2021)   Exercise Vital Sign    Days of Exercise per Week: 5 days    Minutes of Exercise per Session: 30 min  Stress: No Stress Concern Present  (10/14/2021)   [place]    Feeling of Stress : Not at all  Social Connections: [place] (10/14/2021)   Social Connection and Isolation Panel [NHANES]    Frequency of Communication with Friends and Family: More than three times a week    Frequency of Social Gatherings with Friends and Family: Once a week    Attends Religious Services: More than 4 times per year    Active Member of [employer] or Organizations: Yes    Attends Music therapist: More than 4 times per year    Marital Status: Married  Human resources officer Violence: Not At Risk (04/11/2021)   Humiliation, Afraid, Rape, and Kick questionnaire    Fear of Current or Ex-Partner: No    Emotionally Abused: No    Physically Abused: No    Sexually Abused: No    Family History  Problem Relation Age of Onset   Alzheimer's disease Mother    Prostate cancer Father    Colon cancer Maternal Uncle 75   Alcohol abuse Other    Arthritis Other    Breast cancer Other    Ovarian cancer Other    Prostate cancer Other    Stroke Other    Esophageal cancer Neg Hx    Rectal cancer Neg Hx    Stomach cancer Neg Hx     Review of Systems:  As stated in the HPI and otherwise negative.   BP (!) 146/78   Pulse 65   Ht 5' 7.5" (1.715 m)   Wt 86.2 kg   SpO2 100%   BMI 29.32 kg/m   Physical Examination: General: Well developed, well nourished, NAD  HEENT: OP clear, mucus membranes moist  SKIN: warm, dry. No rashes. Neuro: No focal deficits  Musculoskeletal: Muscle strength 5/5 all ext  Psychiatric: Mood and affect normal  Neck: No JVD, no carotid bruits, no thyromegaly, no lymphadenopathy.  Lungs:Clear bilaterally, no wheezes, rhonci, crackles Cardiovascular: Regular rate and rhythm. No murmurs, gallops or rubs. Abdomen:Soft. Bowel sounds present. Non-tender.  Extremities: No lower extremity edema. Pulses are 2 + in the bilateral DP/PT.  EKG:  EKG is  ordered today. The ekg ordered today demonstrates NSR, LVH, non-specific T wave abn  Echo December 2023:  1. The aortic valve is tricuspid. There is mild calcification of the  aortic valve. There is mild thickening of the aortic valve. Aortic valve  regurgitation is severe with diastolic flow reversal noted in the  descending aorta. Aortic valve  sclerosis/calcification is present, without any evidence of  aortic  stenosis. Elevated gradients across the aortic valve likely driven by  severe AR.   2. Left ventricular ejection fraction, by estimation, is 60 to 65%. The  left ventricle has normal function. The left ventricle has no regional  wall motion abnormalities. Left ventricular diastolic parameters are  consistent with Grade I diastolic  dysfunction (impaired relaxation).   3. Right ventricular systolic function is normal. The right ventricular  size is normal. Tricuspid regurgitation signal is inadequate for assessing  PA pressure.   4. Left atrial size was mildly dilated.   5. The mitral valve is grossly normal. Trivial mitral valve  regurgitation.   6. Pulmonic valve regurgitation is moderate.   7. Aortic dilatation noted. There is mild dilatation of the aortic root,  measuring 41 mm. There is mild dilatation of the ascending aorta,  measuring 42 mm.   8. The inferior vena cava is normal in size with greater than 50%  respiratory variability, suggesting right atrial pressure of 3 mmHg.   Recent Labs: 09/17/2021: ALT 10; BUN 13; Creatinine, Ser 0.89; Hemoglobin 14.8; Platelets 203.0; Potassium 4.5; Sodium 140; TSH 1.58   Lipid Panel    Component Value Date/Time   CHOL 168 09/17/2021 0934   TRIG 80.0 09/17/2021 0934   HDL 64.50 09/17/2021 0934   CHOLHDL 3 09/17/2021 0934   VLDL 16.0 09/17/2021 0934   LDLCALC 88 09/17/2021 0934     Wt Readings from Last 3 Encounters:  02/05/22 86.2 kg  12/24/21 87.3 kg  10/15/21 85.7 kg    Assessment and Plan:   Aortic valve  insufficiency: Severe AI by recent echo. He will need to have a surgical evaluation for AVR. I have encouraged him to think about proceeding soon. He wants to go home and discuss this more with his wife. She is with him today. When he calls back, we will arrange a right and left heart cath at [facility]. He will be referred to see CT surgery after his cath.   Labs/ tests ordered today include:  No orders of the defined types were placed in this encounter.  Disposition:   F/U with me in 12 months  25 minutes face to face time reviewing AI, AVR.   Signed, Lauree Chandler, MD 02/05/2022 4:45 PM    [facility] [place], [place], [place]  [address] Phone: [phone]; Fax: [phone]

## 2022-02-05 NOTE — Patient Instructions (Signed)
Medication Instructions:  No changes today *If you need a refill on your cardiac medications before your next appointment, please call your pharmacy*   Lab Work: none   Testing/Procedures: none   Follow-Up: Call or send a MyChart message to let Dr. Angelena Form know when you are ready to discuss setting up heart catheterization.

## 2022-02-06 ENCOUNTER — Telehealth: Payer: Self-pay | Admitting: Cardiovascular Disease

## 2022-02-06 DIAGNOSIS — Z01812 Encounter for preprocedural laboratory examination: Secondary | ICD-10-CM

## 2022-02-06 NOTE — Addendum Note (Signed)
Addended by: Janan Halter F on: 02/06/2022 05:56 PM   Modules accepted: Orders

## 2022-02-06 NOTE — Telephone Encounter (Signed)
Returned call to patient.  Patient states he would like to proceed with Right and Left heart cath as discussed at office visit with Dr. Angelena Form yesterday.  Scheduled patient for right and left heart cath with Dr. Angelena Form on 02/13/22. Reviewed heart cath instructions with patient, sent instructions via MyChart message as well.  Patient scheduled for labs (CBC/BMET) on 02/07/2022.  Patient expressed appreciation for assistance.

## 2022-02-06 NOTE — Telephone Encounter (Signed)
Patient is requesting to speak with Dr. Camillia Herter nurse regarding a heart cath. He states this was discussed during his appointment yesterday.

## 2022-02-07 ENCOUNTER — Ambulatory Visit: Payer: Medicare HMO | Attending: Cardiovascular Disease

## 2022-02-07 DIAGNOSIS — I351 Nonrheumatic aortic (valve) insufficiency: Secondary | ICD-10-CM | POA: Diagnosis not present

## 2022-02-07 DIAGNOSIS — Z01812 Encounter for preprocedural laboratory examination: Secondary | ICD-10-CM

## 2022-02-07 LAB — CBC

## 2022-02-07 NOTE — Addendum Note (Signed)
Addended by: Lauree Chandler D on: 02/07/2022 10:12 AM   Modules accepted: Orders

## 2022-02-08 LAB — BASIC METABOLIC PANEL
BUN/Creatinine Ratio: 18 (ref 10–24)
BUN: 16 mg/dL (ref 8–27)
CO2: 26 mmol/L (ref 20–29)
Calcium: 9.2 mg/dL (ref 8.6–10.2)
Chloride: 101 mmol/L (ref 96–106)
Creatinine, Ser: 0.9 mg/dL (ref 0.76–1.27)
Glucose: 91 mg/dL (ref 70–99)
Potassium: 4.2 mmol/L (ref 3.5–5.2)
Sodium: 139 mmol/L (ref 134–144)
eGFR: 91 mL/min/{1.73_m2} (ref >59)

## 2022-02-08 LAB — CBC
Hematocrit: 44.4 % (ref 37.5–51.0)
Hemoglobin: 14.7 g/dL (ref 13.0–17.7)
MCH: 29.9 pg (ref 26.6–33.0)
MCHC: 33.1 g/dL (ref 31.5–35.7)
MCV: 90 fL (ref 79–97)
Platelets: 203 10*3/uL (ref 150–450)
RBC: 4.91 x10E6/uL (ref 4.14–5.80)
RDW: 13 % (ref 11.6–15.4)
WBC: 3.7 10*3/uL (ref 3.4–10.8)

## 2022-02-11 ENCOUNTER — Telehealth: Payer: Self-pay | Admitting: *Deleted

## 2022-02-11 NOTE — Telephone Encounter (Signed)
Cardiac Catheterization scheduled at [facility] for: Thursday February 13, 2022 12 Noon Arrival time and place: [facility] Main Entrance A at: 10 AM  Nothing to eat after midnight prior to procedure, clear liquids until 5 AM day of procedure.  Medication instructions: -Usual morning medications can be taken with sips of water including aspirin 81 mg.  Confirmed patient has responsible adult to drive home post procedure and be with patient first 24 hours after arriving home.  Reviewed procedure instructions with patient.

## 2022-02-13 ENCOUNTER — Encounter (HOSPITAL_COMMUNITY)
Admission: RE | Disposition: A | Discharge status: Home / Self Care | Payer: Self-pay | Source: Home / Self Care | Attending: Cardiovascular Disease

## 2022-02-13 ENCOUNTER — Ambulatory Visit (HOSPITAL_COMMUNITY)
Admission: RE | Admit: 2022-02-13 | Discharge: 2022-02-13 | Disposition: A | Discharge status: Home / Self Care | Payer: Medicare HMO | Attending: Cardiovascular Disease | Admitting: Cardiovascular Disease

## 2022-02-13 DIAGNOSIS — I351 Nonrheumatic aortic (valve) insufficiency: Secondary | ICD-10-CM

## 2022-02-13 DIAGNOSIS — R0609 Other forms of dyspnea: Secondary | ICD-10-CM | POA: Diagnosis not present

## 2022-02-13 DIAGNOSIS — Z8546 Personal history of malignant neoplasm of prostate: Secondary | ICD-10-CM | POA: Insufficient documentation

## 2022-02-13 HISTORY — PX: RIGHT/LEFT HEART CATH AND CORONARY ANGIOGRAPHY: CATH118266

## 2022-02-13 LAB — POCT I-STAT EG7
Acid-Base Excess: 2 mmol/L (ref 0.0–2.0)
Bicarbonate: 28.3 mmol/L — ABNORMAL HIGH (ref 20.0–28.0)
Calcium, Ion: 1.19 mmol/L (ref 1.15–1.40)
HCT: 42 % (ref 39.0–52.0)
Hemoglobin: 14.3 g/dL (ref 13.0–17.0)
O2 Saturation: 67 %
Potassium: 3.5 mmol/L (ref 3.5–5.1)
Sodium: 141 mmol/L (ref 135–145)
TCO2: 30 mmol/L (ref 22–32)
pCO2, Ven: 49.5 mmHg (ref 44–60)
pH, Ven: 7.365 (ref 7.25–7.43)
pO2, Ven: 37 mmHg (ref 32–45)

## 2022-02-13 LAB — POCT I-STAT 7, (LYTES, BLD GAS, ICA,H+H)
Acid-Base Excess: 1 mmol/L (ref 0.0–2.0)
Bicarbonate: 24.4 mmol/L (ref 20.0–28.0)
Calcium, Ion: 1.2 mmol/L (ref 1.15–1.40)
HCT: 42 % (ref 39.0–52.0)
Hemoglobin: 14.3 g/dL (ref 13.0–17.0)
O2 Saturation: 98 %
Potassium: 3.6 mmol/L (ref 3.5–5.1)
Sodium: 140 mmol/L (ref 135–145)
TCO2: 25 mmol/L (ref 22–32)
pCO2 arterial: 35.6 mmHg (ref 32–48)
pH, Arterial: 7.445 (ref 7.35–7.45)
pO2, Arterial: 99 mmHg (ref 83–108)

## 2022-02-13 SURGERY — RIGHT/LEFT HEART CATH AND CORONARY ANGIOGRAPHY
Anesthesia: LOCAL

## 2022-02-13 MED ORDER — HEPARIN (PORCINE) IN NACL 1000-0.9 UT/500ML-% IV SOLN
INTRAVENOUS | Status: AC
  Filled 2022-02-13: qty 1000

## 2022-02-13 MED ORDER — SODIUM CHLORIDE 0.9 % WEIGHT BASED INFUSION
3.0000 mL/kg/h | INTRAVENOUS | Status: AC
  Administered 2022-02-13: 3 mL/kg/h via INTRAVENOUS

## 2022-02-13 MED ORDER — ACETAMINOPHEN 325 MG PO TABS: 650.0000 mg | ORAL_TABLET | ORAL | Status: DC | PRN

## 2022-02-13 MED ORDER — LIDOCAINE HCL (PF) 1 % IJ SOLN
INTRAMUSCULAR | Status: AC
  Filled 2022-02-13: qty 30

## 2022-02-13 MED ORDER — ONDANSETRON HCL 4 MG/2ML IJ SOLN: 4.0000 mg | Freq: Four times a day (QID) | INTRAMUSCULAR | Status: DC | PRN

## 2022-02-13 MED ORDER — LIDOCAINE HCL (PF) 1 % IJ SOLN
INTRAMUSCULAR | Status: DC | PRN
  Administered 2022-02-13 (×2): 2 mL

## 2022-02-13 MED ORDER — SODIUM CHLORIDE 0.9% FLUSH: 3.0000 mL | INTRAVENOUS | Status: DC | PRN

## 2022-02-13 MED ORDER — HEPARIN (PORCINE) IN NACL 1000-0.9 UT/500ML-% IV SOLN
INTRAVENOUS | Status: DC | PRN
  Administered 2022-02-13 (×2): 500 mL

## 2022-02-13 MED ORDER — HEPARIN SODIUM (PORCINE) 1000 UNIT/ML IJ SOLN
INTRAMUSCULAR | Status: AC
  Filled 2022-02-13: qty 10

## 2022-02-13 MED ORDER — HYDRALAZINE HCL 20 MG/ML IJ SOLN: 10.0000 mg | INTRAMUSCULAR | Status: DC | PRN

## 2022-02-13 MED ORDER — SODIUM CHLORIDE 0.9 % IV SOLN: 250.0000 mL | INTRAVENOUS | Status: DC | PRN

## 2022-02-13 MED ORDER — HEPARIN SODIUM (PORCINE) 1000 UNIT/ML IJ SOLN
INTRAMUSCULAR | Status: DC | PRN
  Administered 2022-02-13: 4000 [IU] via INTRAVENOUS

## 2022-02-13 MED ORDER — SODIUM CHLORIDE 0.9% FLUSH: 3.0000 mL | Freq: Two times a day (BID) | INTRAVENOUS | Status: DC

## 2022-02-13 MED ORDER — SODIUM CHLORIDE 0.9 % IV SOLN: INTRAVENOUS | Status: AC

## 2022-02-13 MED ORDER — ASPIRIN 81 MG PO CHEW: 81.0000 mg | CHEWABLE_TABLET | ORAL | Status: DC

## 2022-02-13 MED ORDER — MIDAZOLAM HCL 2 MG/2ML IJ SOLN
INTRAMUSCULAR | Status: DC | PRN
  Administered 2022-02-13: 2 mg via INTRAVENOUS

## 2022-02-13 MED ORDER — VERAPAMIL HCL 2.5 MG/ML IV SOLN
INTRAVENOUS | Status: AC
  Filled 2022-02-13: qty 2

## 2022-02-13 MED ORDER — VERAPAMIL HCL 2.5 MG/ML IV SOLN
INTRAVENOUS | Status: DC | PRN
  Administered 2022-02-13: 10 mL via INTRA_ARTERIAL

## 2022-02-13 MED ORDER — IOHEXOL 350 MG/ML SOLN
INTRAVENOUS | Status: DC | PRN
  Administered 2022-02-13: 40 mL

## 2022-02-13 MED ORDER — FENTANYL CITRATE (PF) 100 MCG/2ML IJ SOLN
INTRAMUSCULAR | Status: AC
  Filled 2022-02-13: qty 2

## 2022-02-13 MED ORDER — SODIUM CHLORIDE 0.9 % WEIGHT BASED INFUSION: 1.0000 mL/kg/h | INTRAVENOUS | Status: DC

## 2022-02-13 MED ORDER — LABETALOL HCL 5 MG/ML IV SOLN: 10.0000 mg | INTRAVENOUS | Status: DC | PRN

## 2022-02-13 MED ORDER — MIDAZOLAM HCL 2 MG/2ML IJ SOLN
INTRAMUSCULAR | Status: AC
  Filled 2022-02-13: qty 2

## 2022-02-13 MED ORDER — FENTANYL CITRATE (PF) 100 MCG/2ML IJ SOLN
INTRAMUSCULAR | Status: DC | PRN
  Administered 2022-02-13: 50 ug via INTRAVENOUS

## 2022-02-13 SURGICAL SUPPLY — 11 items
CATH 5FR JL3.5 JR4 ANG PIG MP (CATHETERS) IMPLANT
CATH BALLN WEDGE 5F 110CM (CATHETERS) IMPLANT
DEVICE RAD COMP TR BAND LRG (VASCULAR PRODUCTS) IMPLANT
GLIDESHEATH SLEND SS 6F .021 (SHEATH) IMPLANT
GUIDEWIRE INQWIRE 1.5J.035X260 (WIRE) IMPLANT
INQWIRE 1.5J .035X260CM (WIRE) ×1
KIT HEART LEFT (KITS) ×1 IMPLANT
PACK CARDIAC CATHETERIZATION (CUSTOM PROCEDURE TRAY) ×1 IMPLANT
SHEATH GLIDE SLENDER 4/5FR (SHEATH) IMPLANT
TRANSDUCER W/STOPCOCK (MISCELLANEOUS) ×1 IMPLANT
TUBING CIL FLEX 10 FLL-RA (TUBING) ×1 IMPLANT

## 2022-02-13 NOTE — Interval H&P Note (Signed)
History and Physical Interval Note:  02/13/2022 10:35 AM  Matthew [name]  has presented today for surgery, with the diagnosis of severe aortic valve insuficiency.  The various methods of treatment have been discussed with the patient and family. After consideration of risks, benefits and other options for treatment, the patient has consented to  Procedure(s): RIGHT/LEFT HEART CATH AND CORONARY ANGIOGRAPHY (N/A) as a surgical intervention.  The patient's history has been reviewed, patient examined, no change in status, stable for surgery.  I have reviewed the patient's chart and labs.  Questions were answered to the patient's satisfaction.    Cath Lab Visit (complete for each Cath Lab visit)  Clinical Evaluation Leading to the Procedure:   ACS: No.  Non-ACS:    Anginal Classification: No Symptoms  Anti-ischemic medical therapy: No Therapy  Non-Invasive Test Results: No non-invasive testing performed  Prior CABG: No previous CABG        Lauree Chandler

## 2022-02-13 NOTE — Discharge Instructions (Signed)

## 2022-02-13 NOTE — Progress Notes (Signed)
TR BAND REMOVAL  LOCATION:    right radial  DEFLATED PER PROTOCOL:    Yes.    TIME BAND OFF / DRESSING APPLIED:    1405 gauze dressing applied   SITE UPON ARRIVAL:    Level 0  SITE AFTER BAND REMOVAL:    Level 0  CIRCULATION SENSATION AND MOVEMENT:    Within Normal Limits   Yes.    COMMENTS:   no issues noted

## 2022-02-14 ENCOUNTER — Encounter (HOSPITAL_COMMUNITY): Payer: Self-pay | Admitting: Cardiovascular Disease

## 2022-02-24 NOTE — Progress Notes (Signed)
[address]       [place] [address]             [phone]        Micholas A Vessel [employer] Medical Record [id number] Date of Birth: [birth year]  Referring: Burnell Blanks* Primary Care: Laurey Morale, MD Primary Cardiologist:Christopher Angelena Form, MD  Chief Complaint:    Chief Complaint  Patient presents with   Aortic Insuffiency    ECHO 12/4, cath 2/8    History of Present Illness:     72yo male presents for surgical evaluation of severe AI.  He originally started developing some exertional dyspnea in the fall of this year.  He states that when he plays racquetball he starts to get tired about 20 minutes.  He has occasionally had some palpitations, but denies any lower extremity swelling.  He recently was seen by his dentist for an implant, but only has scheduled cleaning.   Past Medical and Surgical History: Previous Chest Surgery: No Previous Chest Radiation: No Diabetes Mellitus: No.  HbA1C 5.9 Creatinine: nml  Past Medical History:  Diagnosis Date   Asthma    as child   Cancer [facility])    prostate    Cataract    ED (erectile dysfunction)    Fracture    rt ankle   Heart murmur    Hemorrhoids    History of colonic polyps    History of prostate cancer    see's Dr. Janice Norrie    Past Surgical History:  Procedure Laterality Date   ANKLE FRACTURE SURGERY Left 2001   CATARACT EXTRACTION W/ INTRAOCULAR LENS IMPLANT Bilateral    per Dr. Lucita Ferrara    COLONOSCOPY  11/18/2018   per Dr. Hilarie Fredrickson, adenomatous polyps, repeat in 7 yrs   [place]  2005   [place] Right 11/07/2013   RETINAL TEAR REPAIR CRYOTHERAPY Bilateral    per Dr. Tempie Hoist   RIGHT/LEFT HEART CATH AND CORONARY ANGIOGRAPHY N/A 02/13/2022   Procedure: RIGHT/LEFT HEART CATH AND CORONARY ANGIOGRAPHY;  Surgeon: Burnell Blanks, MD;  Location: [place] [facility];  Service: Cardiovascular;  Laterality:  N/A;   rt bunion removed Left 1965    Social History: Support: Presents with his wife.  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     b complex vitamins tablet Take 1 tablet by mouth daily.     Cholecalciferol (DIALYVITE VITAMIN D 5000) 125 MCG (5000 UT) capsule Take 5,000 Units by mouth daily.     diclofenac (VOLTAREN) 75 MG EC tablet Take 1 tablet (75 mg total) by mouth 2 (two) times daily. 30 tablet 0   methylPREDNISolone (MEDROL DOSEPAK) 4 MG TBPK tablet Take package as directed. 21 each 0   PRESCRIPTION MEDICATION Inject 1 Dose as directed as needed (erectile dysfunction). papaverine-phentolamine-prostaglandin E1 penile injection  vitamin A 8000 UNIT capsule Take 8,000 Units by mouth daily.     vitamin C (ASCORBIC ACID) 500 MG tablet Take 500 mg by mouth daily.     vitamin E 180 MG (400 UNITS) capsule Take 400 Units by mouth daily.     No current facility-administered medications for this visit.    (Not in a hospital admission)   Family History  Problem Relation Age of Onset   Alzheimer's disease Mother    Prostate cancer Father    Colon cancer Maternal Uncle 48   Alcohol abuse Other    Arthritis Other    Breast cancer Other    Ovarian cancer Other    Prostate cancer Other    Stroke Other    Esophageal cancer Neg Hx    Rectal cancer Neg Hx    Stomach cancer Neg Hx      Review of Systems:   Review of Systems  Constitutional:  Positive for  malaise/fatigue.  Respiratory:  Positive for shortness of breath.   Cardiovascular:  Negative for chest pain, palpitations and leg swelling.  Neurological: Negative.       Physical Exam: BP (!) 158/82 (BP Location: Left Arm, Patient Position: Sitting)   Pulse 62   Resp 18   Ht '5\' 8"'$  (1.727 m)   Wt 186 lb (84.4 kg)   SpO2 97% Comment: RA  BMI 28.28 kg/m  Physical Exam Constitutional:      Appearance: Normal appearance. He is normal weight.  HENT:     Head: Normocephalic and atraumatic.  Eyes:     Extraocular Movements: Extraocular movements intact.  Cardiovascular:     Rate and Rhythm: Normal rate and regular rhythm.     Heart sounds: Murmur heard.  Pulmonary:     Effort: Pulmonary effort is normal. No respiratory distress.  Abdominal:     General: Abdomen is flat. There is no distension.  Musculoskeletal:        General: Normal range of motion.     Cervical back: Normal range of motion.  Skin:    General: Skin is warm and dry.  Neurological:     General: No focal deficit present.     Mental Status: He is alert and oriented to person, place, and time.       Diagnostic Studies & Laboratory data:    Left Heart Catherization:  Intervention  Echo: IMPRESSIONS     1. The aortic valve is tricuspid. There is mild calcification of the  aortic valve. There is mild thickening of the aortic valve. Aortic valve  regurgitation is severe with diastolic flow reversal noted in the  descending aorta. Aortic valve  sclerosis/calcification is present, without any evidence of aortic  stenosis. Elevated gradients across the aortic valve likely driven by  severe AR.   2. Left ventricular ejection fraction, by estimation, is 60 to 65%. The  left ventricle has normal function. The left ventricle has no regional  wall motion abnormalities. Left ventricular diastolic parameters are  consistent with Grade I diastolic  dysfunction (impaired relaxation).   3. Right ventricular  systolic function is normal. The right ventricular  size is normal. Tricuspid regurgitation signal is inadequate for assessing  PA pressure.   4. Left atrial size was mildly dilated.   5. The mitral valve is grossly normal. Trivial mitral valve  regurgitation.   6. Pulmonic valve regurgitation is moderate.   7. Aortic dilatation noted. There is mild dilatation of the aortic root,  measuring 41 mm.  There is mild dilatation of the ascending aorta,  measuring 42 mm.   8. The inferior vena cava is normal in size with greater than 50%  respiratory variability, suggesting right atrial pressure of 3 mmHg.   Comparison(s): Compared to prior TTE in 12/2020, the AR now appears severe  with diastolic flow reversal noted in the descending aorta.   EKG: sinus I have independently reviewed the above radiologic studies and discussed with the patient   Recent Lab Findings: Lab Results  Component Value Date   WBC 3.7 02/07/2022   HGB 14.3 02/13/2022   HCT 42.0 02/13/2022   PLT 203 02/07/2022   GLUCOSE 91 02/07/2022   CHOL 168 09/17/2021   TRIG 80.0 09/17/2021   HDL 64.50 09/17/2021   LDLCALC 88 09/17/2021   ALT 10 09/17/2021   AST 17 09/17/2021   NA 141 02/13/2022   K 3.5 02/13/2022   CL 101 02/07/2022   CREATININE 0.90 02/07/2022   BUN 16 02/07/2022   CO2 26 02/07/2022   TSH 1.58 09/17/2021   HGBA1C 5.6 09/17/2021      Assessment / Plan:   72 yo male with severe AI.  Preserved biventricular function, and no coronary disease.  Will need dental clearance.  I reviewed the CT scan for his coronary calcium scoring, which did not show significant aortic calcification or aneurysmal disease.  We discussed the risks and benefits of a bioprosthetic aortic valve replacement.  He is agreeable to proceed.     I  spent 40 minutes counseling the patient face to face.   Lajuana Matte 02/28/2022 1:06 PM

## 2022-02-24 NOTE — H&P (View-Only) (Signed)
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  He originally started developing some exertional dyspnea in the fall of this year.  He states that when he plays racquetball he starts to get tired about 20 minutes.  He has occasionally had some palpitations, but denies any lower extremity swelling.  He recently was seen by his dentist for an implant, but only has scheduled cleaning.   Past Medical and Surgical History: Previous Chest Surgery: No Previous Chest Radiation: No Diabetes Mellitus: No.  HbA1C 5.9 Creatinine: nml  Past Medical History:  Diagnosis Date   Asthma    as child   Cancer [facility])    prostate    Cataract    ED (erectile dysfunction)    Fracture    rt ankle   Heart murmur    Hemorrhoids    History of colonic polyps    History of prostate cancer    see's Dr. Janice Norrie    Past Surgical History:  Procedure Laterality Date   ANKLE FRACTURE SURGERY Left 2001   CATARACT EXTRACTION W/ INTRAOCULAR LENS IMPLANT Bilateral    per Dr. Lucita Ferrara    COLONOSCOPY  11/18/2018   per Dr. Hilarie Fredrickson, adenomatous polyps, repeat in 7 yrs   [place]  2005   [place] Right 11/07/2013   RETINAL TEAR REPAIR CRYOTHERAPY Bilateral    per Dr. Tempie Hoist   RIGHT/LEFT HEART CATH AND CORONARY ANGIOGRAPHY N/A 02/13/2022   Procedure: RIGHT/LEFT HEART CATH AND CORONARY ANGIOGRAPHY;  Surgeon: Burnell Blanks, MD;  Location: [place] [place] [facility];  Service: Cardiovascular;  Laterality:  N/A;   rt bunion removed Left 1965    Social History: Support: Presents with his wife.  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     b complex vitamins tablet Take 1 tablet by mouth daily.     Cholecalciferol (DIALYVITE VITAMIN D 5000) 125 MCG (5000 UT) capsule Take 5,000 Units by mouth daily.     diclofenac (VOLTAREN) 75 MG EC tablet Take 1 tablet (75 mg total) by mouth 2 (two) times daily. 30 tablet 0   methylPREDNISolone (MEDROL DOSEPAK) 4 MG TBPK tablet Take package as directed. 21 each 0   PRESCRIPTION MEDICATION Inject 1 Dose as directed as needed (erectile dysfunction). papaverine-phentolamine-prostaglandin E1 penile injection  vitamin A 8000 UNIT capsule Take 8,000 Units by mouth daily.     vitamin C (ASCORBIC ACID) 500 MG tablet Take 500 mg by mouth daily.     vitamin E 180 MG (400 UNITS) capsule Take 400 Units by mouth daily.     No current facility-administered medications for this visit.    (Not in a hospital admission)   Family History  Problem Relation Age of Onset   Alzheimer's disease Mother    Prostate cancer Father    Colon cancer Maternal Uncle 55   Alcohol abuse Other    Arthritis Other    Breast cancer Other    Ovarian cancer Other    Prostate cancer Other    Stroke Other    Esophageal cancer Neg Hx    Rectal cancer Neg Hx    Stomach cancer Neg Hx      Review of Systems:   Review of Systems  Constitutional:  Positive for  malaise/fatigue.  Respiratory:  Positive for shortness of breath.   Cardiovascular:  Negative for chest pain, palpitations and leg swelling.  Neurological: Negative.       Physical Exam: BP (!) 158/82 (BP Location: Left Arm, Patient Position: Sitting)   Pulse 62   Resp 18   Ht '5\' 8"'$  (1.727 m)   Wt 186 lb (84.4 kg)   SpO2 97% Comment: RA  BMI 28.28 kg/m  Physical Exam Constitutional:      Appearance: Normal appearance. He is normal weight.  HENT:     Head: Normocephalic and atraumatic.  Eyes:     Extraocular Movements: Extraocular movements intact.  Cardiovascular:     Rate and Rhythm: Normal rate and regular rhythm.     Heart sounds: Murmur heard.  Pulmonary:     Effort: Pulmonary effort is normal. No respiratory distress.  Abdominal:     General: Abdomen is flat. There is no distension.  Musculoskeletal:        General: Normal range of motion.     Cervical back: Normal range of motion.  Skin:    General: Skin is warm and dry.  Neurological:     General: No focal deficit present.     Mental Status: He is alert and oriented to person, place, and time.       Diagnostic Studies & Laboratory data:    Left Heart Catherization:  Intervention  Echo: IMPRESSIONS     1. The aortic valve is tricuspid. There is mild calcification of the  aortic valve. There is mild thickening of the aortic valve. Aortic valve  regurgitation is severe with diastolic flow reversal noted in the  descending aorta. Aortic valve  sclerosis/calcification is present, without any evidence of aortic  stenosis. Elevated gradients across the aortic valve likely driven by  severe AR.   2. Left ventricular ejection fraction, by estimation, is 60 to 65%. The  left ventricle has normal function. The left ventricle has no regional  wall motion abnormalities. Left ventricular diastolic parameters are  consistent with Grade I diastolic  dysfunction (impaired relaxation).   3. Right ventricular  systolic function is normal. The right ventricular  size is normal. Tricuspid regurgitation signal is inadequate for assessing  PA pressure.   4. Left atrial size was mildly dilated.   5. The mitral valve is grossly normal. Trivial mitral valve  regurgitation.   6. Pulmonic valve regurgitation is moderate.   7. Aortic dilatation noted. There is mild dilatation of the aortic root,  measuring 41 mm.  There is mild dilatation of the ascending aorta,  measuring 42 mm.   8. The inferior vena cava is normal in size with greater than 50%  respiratory variability, suggesting right atrial pressure of 3 mmHg.   Comparison(s): Compared to prior TTE in 12/2020, the AR now appears severe  with diastolic flow reversal noted in the descending aorta.   EKG: sinus I have independently reviewed the above radiologic studies and discussed with the patient   Recent Lab Findings: Lab Results  Component Value Date   WBC 3.7 02/07/2022   HGB 14.3 02/13/2022   HCT 42.0 02/13/2022   PLT 203 02/07/2022   GLUCOSE 91 02/07/2022   CHOL 168 09/17/2021   TRIG 80.0 09/17/2021   HDL 64.50 09/17/2021   LDLCALC 88 09/17/2021   ALT 10 09/17/2021   AST 17 09/17/2021   NA 141 02/13/2022   K 3.5 02/13/2022   CL 101 02/07/2022   CREATININE 0.90 02/07/2022   BUN 16 02/07/2022   CO2 26 02/07/2022   TSH 1.58 09/17/2021   HGBA1C 5.6 09/17/2021      Assessment / Plan:   73 yo male with severe AI.  Preserved biventricular function, and no coronary disease.  Will need dental clearance.  I reviewed the CT scan for his coronary calcium scoring, which did not show significant aortic calcification or aneurysmal disease.  We discussed the risks and benefits of a bioprosthetic aortic valve replacement.  He is agreeable to proceed.     I  spent 40 minutes counseling the patient face to face.   Lajuana Matte 02/28/2022 1:06 PM

## 2022-02-28 ENCOUNTER — Institutional Professional Consult (permissible substitution) (INDEPENDENT_AMBULATORY_CARE_PROVIDER_SITE_OTHER): Payer: Medicare HMO | Admitting: Thoracic Surgery (Cardiothoracic Vascular Surgery)

## 2022-02-28 ENCOUNTER — Encounter: Payer: Self-pay | Admitting: *Deleted

## 2022-02-28 ENCOUNTER — Other Ambulatory Visit: Payer: Self-pay | Admitting: *Deleted

## 2022-02-28 VITALS — BP 158/82 | HR 62 | Resp 18 | Ht 68.0 in | Wt 186.0 lb

## 2022-02-28 DIAGNOSIS — I351 Nonrheumatic aortic (valve) insufficiency: Secondary | ICD-10-CM

## 2022-03-03 ENCOUNTER — Encounter: Payer: Self-pay | Admitting: *Deleted

## 2022-03-06 ENCOUNTER — Other Ambulatory Visit: Payer: Self-pay

## 2022-03-06 ENCOUNTER — Encounter (HOSPITAL_COMMUNITY): Payer: Self-pay | Admitting: Thoracic Surgery (Cardiothoracic Vascular Surgery)

## 2022-03-06 NOTE — Pre-Procedure Instructions (Signed)
Surgical Instructions    Your procedure is scheduled on March 10, 2022.  Report to [facility] Main Entrance "A" at 5:30 A.M., then check in with the Admitting office.  Call this number if you have problems the morning of surgery:  [phone]  If you have any questions prior to your surgery date call [phone]: Open Monday-Friday 8am-4pm If you experience any cold or flu symptoms such as cough, fever, chills, shortness of breath, etc. between now and your scheduled surgery, please notify us at the above number.     Remember:  Do not eat or drink after midnight the night before your surgery      Take these medicines the morning of surgery with A SIP OF WATER:  NONE   Continue to take your Aspirin through the day before surgery. DO NOT take any morning of surgery.   As of today, STOP taking any Aleve, Naproxen, Ibuprofen, Motrin, Advil, Goody's, BC's, all herbal medications, fish oil, and all vitamins.                     Do NOT Smoke (Tobacco/Vaping) for 24 hours prior to your procedure.  If you use a CPAP at night, you may bring your mask/headgear for your overnight stay.   Contacts, glasses, piercing's, hearing aid's, dentures or partials may not be worn into surgery, please bring cases for these belongings.    For patients admitted to the hospital, discharge time will be determined by your treatment team.   Patients discharged the day of surgery will not be allowed to drive home, and someone needs to stay with them for 24 hours.  SURGICAL WAITING ROOM VISITATION Patients having surgery or a procedure may have no more than 2 support people in the waiting area - these visitors may rotate.   Children under the age of 39 must have an adult with them who is not the patient. If the patient needs to stay at the hospital during part of their recovery, the visitor guidelines for inpatient rooms apply. Pre-op nurse will coordinate an appropriate time for 1 support person to  accompany patient in pre-op.  This support person may not rotate.   Please refer to the [facility] website for the visitor guidelines for Inpatients (after your surgery is over and you are in a regular room).    Special instructions:   [place]- Preparing For Surgery  Before surgery, you can play an important role. Because skin is not sterile, your skin needs to be as free of germs as possible. You can reduce the number of germs on your skin by washing with CHG (chlorahexidine gluconate) Soap before surgery.  CHG is an antiseptic cleaner which kills germs and bonds with the skin to continue killing germs even after washing.    Oral Hygiene is also important to reduce your risk of infection.  Remember - BRUSH YOUR TEETH THE MORNING OF SURGERY WITH YOUR REGULAR TOOTHPASTE  Please do not use if you have an allergy to CHG or antibacterial soaps. If your skin becomes reddened/irritated stop using the CHG.  Do not shave (including legs and underarms) for at least 48 hours prior to first CHG shower. It is OK to shave your face.  Please follow these instructions carefully.   Shower the NIGHT BEFORE SURGERY and the MORNING OF SURGERY  If you chose to wash your hair, wash your hair first as usual with your normal shampoo.  After you shampoo, rinse your hair and body  thoroughly to remove the shampoo.  Use CHG Soap as you would any other liquid soap. You can apply CHG directly to the skin and wash gently with a scrungie or a clean washcloth.   Apply the CHG Soap to your body ONLY FROM THE NECK DOWN.  Do not use on open wounds or open sores. Avoid contact with your eyes, ears, mouth and genitals (private parts). Wash Face and genitals (private parts)  with your normal soap.   Wash thoroughly, paying special attention to the area where your surgery will be performed.  Thoroughly rinse your body with warm water from the neck down.  DO NOT shower/wash with your normal soap after using and rinsing  off the CHG Soap.  Pat yourself dry with a CLEAN TOWEL.  Wear CLEAN PAJAMAS to bed the night before surgery  Place CLEAN SHEETS on your bed the night before your surgery  DO NOT SLEEP WITH PETS.   Day of Surgery: Take a shower with CHG soap. Do not wear jewelry or makeup Do not wear lotions, powders, perfumes/colognes, or deodorant. Do not shave 48 hours prior to surgery.  Men may shave face and neck. Do not bring valuables to the hospital.  [facility] is not responsible for any belongings or valuables. Do not wear nail polish, gel polish, artificial nails, or any other type of covering on natural nails (fingers and toes) If you have artificial nails or gel coating that need to be removed by a nail salon, please have this removed prior to surgery. Artificial nails or gel coating may interfere with anesthesia's ability to adequately monitor your vital signs.  Wear Clean/Comfortable clothing the morning of surgery Remember to brush your teeth WITH YOUR REGULAR TOOTHPASTE.   Please read over the following fact sheets that you were given.    If you received a COVID test during your pre-op visit  it is requested that you wear a mask when out in public, stay away from anyone that may not be feeling well and notify your surgeon if you develop symptoms. If you have been in contact with anyone that has tested positive in the last 10 days please notify you surgeon.

## 2022-03-06 NOTE — Progress Notes (Addendum)
PCP - Dr. Alysia Penna Cardiologist - Dr. Lauree Chandler  PPM/ICD - Denies  Chest x-ray - DOS EKG - DOS Stress Test - Denies ECHO - 12/09/2021 Cardiac Cath - 02/13/2022  CPAP - Denies  Non-diabetic  Blood Thinner Instructions: Denies Aspirin Instructions: Follow surgeon instructions re: when to stop.   ERAS Protcol - No, NPO  COVID TEST- To obtain DOS  Anesthesia review: yes, scheduled for aortic valve replacement.  Will obtain covid, CXR and EKG DOS.   Patient verbally denies any shortness of breath, fever, cough and chest pain during phone call   -------------  SDW INSTRUCTIONS given:  Your procedure is scheduled on Monday, March 10, 2022.  Report to [facility] Main Entrance "A" at 5:30 A.M., and check in at the Admitting office.  Call this number if you have problems the morning of surgery:  [phone]   Remember:  Do not eat or drink after midnight the night before your surgery    Take these medicines the morning of surgery with A SIP OF WATER: None  Follow your surgeon's instructions on when to stop Aspirin.  If no instructions were given by your surgeon then you will need to call the office to get those instructions.    As of today, STOP taking any Aleve, Naproxen, Ibuprofen, Motrin, Advil, Goody's, BC's, all herbal medications, fish oil, and all vitamins.                      Do not wear jewelry            Do not wear lotions, powders, colognes, or deodorant. Men may shave face and neck.            Do not bring valuables to the hospital.             [facility] is not responsible for any belongings or valuables.  Do NOT Smoke (Tobacco/Vaping) 24 hours prior to your procedure If you use a CPAP at night, you may bring all equipment for your overnight stay.   Contacts, glasses, dentures or bridgework may not be worn into surgery.      For patients admitted to the hospital, discharge time will be determined by your treatment team.   Patients discharged  the day of surgery will not be allowed to drive home, and someone needs to stay with them for 24 hours.    Special instructions:   [place]- Preparing For Surgery  Before surgery, you can play an important role. Because skin is not sterile, your skin needs to be as free of germs as possible. You can reduce the number of germs on your skin by washing with Dial Soap before surgery.    Oral Hygiene is also important to reduce your risk of infection.  Remember - BRUSH YOUR TEETH THE MORNING OF SURGERY WITH YOUR REGULAR TOOTHPASTE   Please follow these instructions carefully.   Shower the NIGHT BEFORE SURGERY and the MORNING OF SURGERY with DIAL Soap.   Pat yourself dry with a CLEAN TOWEL.  Wear CLEAN PAJAMAS to bed the night before surgery  Place CLEAN SHEETS on your bed the night of your first shower and DO NOT SLEEP WITH PETS.   Day of Surgery: Please shower morning of surgery  Wear Clean/Comfortable clothing the morning of surgery Do not apply any deodorants/lotions.   Remember to brush your teeth WITH YOUR REGULAR TOOTHPASTE.   Questions were answered. Patient verbalized understanding of instructions.

## 2022-03-07 ENCOUNTER — Encounter (HOSPITAL_COMMUNITY): Payer: Self-pay

## 2022-03-07 ENCOUNTER — Encounter (HOSPITAL_COMMUNITY)
Admission: RE | Admit: 2022-03-07 | Discharge: 2022-03-07 | Disposition: A | Discharge status: Home / Self Care | Payer: Medicare HMO | Source: Ambulatory Visit | Attending: Thoracic Surgery (Cardiothoracic Vascular Surgery) | Admitting: Thoracic Surgery (Cardiothoracic Vascular Surgery)

## 2022-03-07 ENCOUNTER — Other Ambulatory Visit: Payer: Self-pay

## 2022-03-07 ENCOUNTER — Ambulatory Visit (HOSPITAL_COMMUNITY)
Admission: RE | Admit: 2022-03-07 | Discharge: 2022-03-07 | Disposition: A | Discharge status: Home / Self Care | Payer: Medicare HMO | Source: Ambulatory Visit | Attending: Thoracic Surgery (Cardiothoracic Vascular Surgery) | Admitting: Thoracic Surgery (Cardiothoracic Vascular Surgery)

## 2022-03-07 ENCOUNTER — Ambulatory Visit (HOSPITAL_BASED_OUTPATIENT_CLINIC_OR_DEPARTMENT_OTHER)
Admission: RE | Admit: 2022-03-07 | Discharge: 2022-03-07 | Disposition: A | Discharge status: Home / Self Care | Payer: Medicare HMO | Source: Ambulatory Visit | Attending: Thoracic Surgery (Cardiothoracic Vascular Surgery) | Admitting: Thoracic Surgery (Cardiothoracic Vascular Surgery)

## 2022-03-07 VITALS — BP 134/83 | HR 58 | Temp 97.7°F | Resp 17 | Ht 68.0 in | Wt 192.9 lb

## 2022-03-07 DIAGNOSIS — Z01818 Encounter for other preprocedural examination: Secondary | ICD-10-CM

## 2022-03-07 DIAGNOSIS — J45909 Unspecified asthma, uncomplicated: Secondary | ICD-10-CM | POA: Insufficient documentation

## 2022-03-07 DIAGNOSIS — Z1152 Encounter for screening for COVID-19: Secondary | ICD-10-CM | POA: Insufficient documentation

## 2022-03-07 DIAGNOSIS — E8770 Fluid overload, unspecified: Secondary | ICD-10-CM | POA: Diagnosis not present

## 2022-03-07 DIAGNOSIS — J939 Pneumothorax, unspecified: Secondary | ICD-10-CM | POA: Diagnosis not present

## 2022-03-07 DIAGNOSIS — J90 Pleural effusion, not elsewhere classified: Secondary | ICD-10-CM | POA: Diagnosis not present

## 2022-03-07 DIAGNOSIS — R578 Other shock: Secondary | ICD-10-CM | POA: Diagnosis not present

## 2022-03-07 DIAGNOSIS — R571 Hypovolemic shock: Secondary | ICD-10-CM | POA: Diagnosis not present

## 2022-03-07 DIAGNOSIS — I351 Nonrheumatic aortic (valve) insufficiency: Secondary | ICD-10-CM

## 2022-03-07 DIAGNOSIS — D696 Thrombocytopenia, unspecified: Secondary | ICD-10-CM | POA: Diagnosis not present

## 2022-03-07 DIAGNOSIS — I371 Nonrheumatic pulmonary valve insufficiency: Secondary | ICD-10-CM | POA: Insufficient documentation

## 2022-03-07 DIAGNOSIS — Z9079 Acquired absence of other genital organ(s): Secondary | ICD-10-CM | POA: Insufficient documentation

## 2022-03-07 DIAGNOSIS — D689 Coagulation defect, unspecified: Secondary | ICD-10-CM | POA: Diagnosis not present

## 2022-03-07 DIAGNOSIS — Z79899 Other long term (current) drug therapy: Secondary | ICD-10-CM | POA: Insufficient documentation

## 2022-03-07 DIAGNOSIS — D62 Acute posthemorrhagic anemia: Secondary | ICD-10-CM | POA: Diagnosis not present

## 2022-03-07 DIAGNOSIS — R001 Bradycardia, unspecified: Secondary | ICD-10-CM | POA: Insufficient documentation

## 2022-03-07 DIAGNOSIS — Z8546 Personal history of malignant neoplasm of prostate: Secondary | ICD-10-CM | POA: Insufficient documentation

## 2022-03-07 DIAGNOSIS — Z8719 Personal history of other diseases of the digestive system: Secondary | ICD-10-CM | POA: Insufficient documentation

## 2022-03-07 DIAGNOSIS — I10 Essential (primary) hypertension: Secondary | ICD-10-CM | POA: Diagnosis not present

## 2022-03-07 DIAGNOSIS — I959 Hypotension, unspecified: Secondary | ICD-10-CM | POA: Diagnosis not present

## 2022-03-07 HISTORY — DX: Headache, unspecified: R51.9

## 2022-03-07 HISTORY — DX: Pneumonia, unspecified organism: J18.9

## 2022-03-07 LAB — COMPREHENSIVE METABOLIC PANEL
ALT: 11 U/L (ref 0–44)
AST: 24 U/L (ref 15–41)
Albumin: 4 g/dL (ref 3.5–5.0)
Alkaline Phosphatase: 63 U/L (ref 38–126)
Anion gap: 13 (ref 5–15)
BUN: 10 mg/dL (ref 8–23)
CO2: 20 mmol/L — ABNORMAL LOW (ref 22–32)
Calcium: 8.8 mg/dL — ABNORMAL LOW (ref 8.9–10.3)
Chloride: 103 mmol/L (ref 98–111)
Creatinine, Ser: 0.85 mg/dL (ref 0.61–1.24)
GFR, Estimated: >60 mL/min (ref >60)
Glucose, Bld: 88 mg/dL (ref 70–99)
Potassium: 4.1 mmol/L (ref 3.5–5.1)
Sodium: 136 mmol/L (ref 135–145)
Total Bilirubin: 0.9 mg/dL (ref 0.3–1.2)
Total Protein: 6.7 g/dL (ref 6.5–8.1)

## 2022-03-07 LAB — BLOOD GAS, ARTERIAL
Acid-Base Excess: 2.8 mmol/L — ABNORMAL HIGH (ref 0.0–2.0)
Bicarbonate: 27.2 mmol/L (ref 20.0–28.0)
Drawn by: 58793
O2 Saturation: 99.6 %
Patient temperature: 37
pCO2 arterial: 40 mmHg (ref 32–48)
pH, Arterial: 7.44 (ref 7.35–7.45)
pO2, Arterial: 131 mmHg — ABNORMAL HIGH (ref 83–108)

## 2022-03-07 LAB — APTT: aPTT: 32 seconds (ref 24–36)

## 2022-03-07 LAB — URINALYSIS, ROUTINE W REFLEX MICROSCOPIC
Bilirubin Urine: NEGATIVE
Glucose, UA: NEGATIVE mg/dL
Hgb urine dipstick: NEGATIVE
Ketones, ur: NEGATIVE mg/dL
Leukocytes,Ua: NEGATIVE
Nitrite: NEGATIVE
Protein, ur: NEGATIVE mg/dL
Specific Gravity, Urine: 1.005 (ref 1.005–1.030)
pH: 8 (ref 5.0–8.0)

## 2022-03-07 LAB — CBC
HCT: 43.7 % (ref 39.0–52.0)
Hemoglobin: 14.5 g/dL (ref 13.0–17.0)
MCH: 30.1 pg (ref 26.0–34.0)
MCHC: 33.2 g/dL (ref 30.0–36.0)
MCV: 90.7 fL (ref 80.0–100.0)
Platelets: 195 10*3/uL (ref 150–400)
RBC: 4.82 MIL/uL (ref 4.22–5.81)
RDW: 12.8 % (ref 11.5–15.5)
WBC: 4.1 10*3/uL (ref 4.0–10.5)
nRBC: 0 % (ref 0.0–0.2)

## 2022-03-07 LAB — PROTIME-INR
INR: 1.1 (ref 0.8–1.2)
Prothrombin Time: 13.8 seconds (ref 11.4–15.2)

## 2022-03-07 LAB — SURGICAL PCR SCREEN
MRSA, PCR: NEGATIVE
Staphylococcus aureus: NEGATIVE

## 2022-03-07 MED ORDER — VANCOMYCIN HCL 1500 MG/300ML IV SOLN
1500.0000 mg | INTRAVENOUS | Status: AC
  Administered 2022-03-10: 1500 mg via INTRAVENOUS
  Filled 2022-03-07: qty 300

## 2022-03-07 MED ORDER — TRANEXAMIC ACID (OHS) BOLUS VIA INFUSION
15.0000 mg/kg | INTRAVENOUS | Status: AC
  Administered 2022-03-10: 1266 mg via INTRAVENOUS
  Filled 2022-03-07: qty 1266

## 2022-03-07 MED ORDER — CEFAZOLIN SODIUM-DEXTROSE 2-4 GM/100ML-% IV SOLN
2.0000 g | INTRAVENOUS | Status: AC
  Administered 2022-03-10: 2 g via INTRAVENOUS
  Filled 2022-03-07: qty 100

## 2022-03-07 MED ORDER — NOREPINEPHRINE 4 MG/250ML-% IV SOLN
0.0000 ug/min | INTRAVENOUS | Status: DC
  Filled 2022-03-07: qty 250

## 2022-03-07 MED ORDER — DEXMEDETOMIDINE HCL IN NACL 400 MCG/100ML IV SOLN
0.1000 ug/kg/h | INTRAVENOUS | Status: AC
  Administered 2022-03-10: .5 ug/kg/h via INTRAVENOUS
  Filled 2022-03-07: qty 100

## 2022-03-07 MED ORDER — MILRINONE LACTATE IN DEXTROSE 20-5 MG/100ML-% IV SOLN
0.3000 ug/kg/min | INTRAVENOUS | Status: DC
  Filled 2022-03-07: qty 100

## 2022-03-07 MED ORDER — TRANEXAMIC ACID (OHS) PUMP PRIME SOLUTION
2.0000 mg/kg | INTRAVENOUS | Status: DC
  Filled 2022-03-07: qty 1.69

## 2022-03-07 MED ORDER — TRANEXAMIC ACID 1000 MG/10ML IV SOLN
1.5000 mg/kg/h | INTRAVENOUS | Status: AC
  Administered 2022-03-10: 1.5 mg/kg/h via INTRAVENOUS
  Filled 2022-03-07: qty 25

## 2022-03-07 MED ORDER — HEPARIN 30,000 UNITS/1000 ML (OHS) CELLSAVER SOLUTION
Status: DC
  Filled 2022-03-07: qty 1000

## 2022-03-07 MED ORDER — MANNITOL 20 % IV SOLN
INTRAVENOUS | Status: DC
  Filled 2022-03-07: qty 13

## 2022-03-07 MED ORDER — EPINEPHRINE HCL 5 MG/250ML IV SOLN IN NS
0.0000 ug/min | INTRAVENOUS | Status: DC
  Filled 2022-03-07: qty 250

## 2022-03-07 MED ORDER — PLASMA-LYTE A IV SOLN
INTRAVENOUS | Status: DC
  Filled 2022-03-07: qty 2.5

## 2022-03-07 MED ORDER — PHENYLEPHRINE HCL-NACL 20-0.9 MG/250ML-% IV SOLN
30.0000 ug/min | INTRAVENOUS | Status: AC
  Administered 2022-03-10: 20 ug/min via INTRAVENOUS
  Filled 2022-03-07: qty 250

## 2022-03-07 MED ORDER — INSULIN REGULAR(HUMAN) IN NACL 100-0.9 UT/100ML-% IV SOLN
INTRAVENOUS | Status: AC
  Administered 2022-03-10: 1.7 [IU]/h via INTRAVENOUS
  Filled 2022-03-07: qty 100

## 2022-03-07 MED ORDER — POTASSIUM CHLORIDE 2 MEQ/ML IV SOLN
80.0000 meq | INTRAVENOUS | Status: DC
  Filled 2022-03-07: qty 40

## 2022-03-07 MED ORDER — CEFAZOLIN SODIUM-DEXTROSE 2-4 GM/100ML-% IV SOLN
2.0000 g | INTRAVENOUS | Status: AC
  Administered 2022-03-10: 2 g via INTRAVENOUS
  Filled 2022-03-07 (×2): qty 100

## 2022-03-07 MED ORDER — NITROGLYCERIN IN D5W 200-5 MCG/ML-% IV SOLN
2.0000 ug/min | INTRAVENOUS | Status: DC
  Filled 2022-03-07: qty 250

## 2022-03-07 NOTE — Progress Notes (Signed)
PCP - Dr. Alysia Penna Cardiologist - Dr. Lauree Chandler  PPM/ICD - Denies Device Orders - n/a Rep Notified - n/a  Chest x-ray - 03/07/2022 EKG - 03/07/2022 Stress Test - Denies ECHO - 12/09/2021 Cardiac Cath - 02/13/2022  Sleep Study - Denies CPAP - n/a  No DM  Last dose of GLP1 agonist- n/a GLP1 instructions: n/a  Blood Thinner Instructions: n/a Aspirin Instructions: Pt will continue to take ASA through the day before surgery. He will not take any the morning of surgery.  NPO after midnight  COVID TEST- Yes. Result Pending   Anesthesia review: No.  Patient denies shortness of breath, fever, cough and chest pain at PAT appointment. Pt also denies any respiratory illness/infection in the past two months.   All instructions explained to the patient, with a verbal understanding of the material. Patient agrees to go over the instructions while at home for a better understanding. Patient also instructed to self quarantine after being tested for COVID-19. The opportunity to ask questions was provided.

## 2022-03-07 NOTE — Progress Notes (Signed)
Anesthesia Chart Review:  Case: [id number] Date/Time: 03/10/22 0715   Procedures:      AORTIC VALVE REPLACEMENT (AVR) (Chest)     TRANSESOPHAGEAL ECHOCARDIOGRAM   Anesthesia type: General   Pre-op diagnosis: Severe AI   Location: [facility] OR ROOM 31 / [place] OR   Surgeons: Lajuana Matte, MD       DISCUSSION: Patient is a 72 year old male scheduled for the above procedure.  History includes never smoker, murmur/severe AI, exertional dyspnea, prostate cancer (s/p prostatectomy 2005), childhood asthma. No angiographic evidence of CAD by [facility] 02/13/22.  03/07/22 presurgical COVID-19, A1c, CXR reports are still in process.   Anesthesia team to evaluate on the day of surgery.  VS: BP 134/83   Pulse (!) 58   Temp 36.5 C   Resp 17   Ht '5\' 8"'$  (1.727 m)   Wt 87.5 kg   SpO2 100%   BMI 29.33 kg/m   PROVIDERS: Laurey Morale, MD is PCP  Lauree Chandler, MD is cardiologist   LABS: Labs reviewed: Acceptable for surgery. A1c and COVID-19 test are still in process. (all labs ordered are listed, but only abnormal results are displayed)  Labs Reviewed  COMPREHENSIVE METABOLIC PANEL - Abnormal; Notable for the following components:      Result Value   CO2 20 (*)    Calcium 8.8 (*)    All other components within normal limits  URINALYSIS, ROUTINE W REFLEX MICROSCOPIC - Abnormal; Notable for the following components:   Color, Urine STRAW (*)    All other components within normal limits  BLOOD GAS, ARTERIAL - Abnormal; Notable for the following components:   pO2, Arterial 131 (*)    Acid-Base Excess 2.8 (*)    All other components within normal limits  SURGICAL PCR SCREEN  SARS CORONAVIRUS 2 (TAT 6-24 HRS)  CBC  PROTIME-INR  APTT  HEMOGLOBIN A1C  TYPE AND SCREEN     IMAGES: CXR 03/07/22: In process.   EKG: Sinus bradycardia at 55 bpm Left axis deviation Left ventricular hypertrophy ( R in aVL , Romhilt-Estes ) Nonspecific T wave abnormality  CV: US Carotid  03/07/22: Summary:  - Right Carotid: The extracranial vessels were near-normal with only minimal wall thickening or plaque.  - Left Carotid: The extracranial vessels were near-normal with only minimal wall thickening or plaque.  - Vertebrals:  Bilateral vertebral arteries demonstrate antegrade flow.  - Subclavians: Normal flow hemodynamics were seen in bilateral subclavian arteries.    RHC/LHC 02/13/22: No angiographic evidence of CAD Normal right heart pressures (RA 4, RV 27/1/6, PA 28/8 mean 16, PCWP 8) LVEDP 15 mmHg.  Severe aortic valve insufficiency by echo   Echo 12/09/21: IMPRESSIONS   1. The aortic valve is tricuspid. There is mild calcification of the  aortic valve. There is mild thickening of the aortic valve. Aortic valve  regurgitation is severe with diastolic flow reversal noted in the  descending aorta. Aortic valve  sclerosis/calcification is present, without any evidence of aortic  stenosis. Elevated gradients across the aortic valve likely driven by  severe AR.   2. Left ventricular ejection fraction, by estimation, is 60 to 65%. The  left ventricle has normal function. The left ventricle has no regional  wall motion abnormalities. Left ventricular diastolic parameters are  consistent with Grade I diastolic  dysfunction (impaired relaxation).   3. Right ventricular systolic function is normal. The right ventricular  size is normal. Tricuspid regurgitation signal is inadequate for assessing  PA pressure.   4.  Left atrial size was mildly dilated.   5. The mitral valve is grossly normal. Trivial mitral valve  regurgitation.   6. Pulmonic valve regurgitation is moderate.   7. Aortic dilatation noted. There is mild dilatation of the aortic root,  measuring 41 mm. There is mild dilatation of the ascending aorta,  measuring 42 mm.   8. The inferior vena cava is normal in size with greater than 50%  respiratory variability, suggesting right atrial pressure of 3 mmHg.    Comparison(s): Compared to prior TTE in 12/2020, the AR now appears severe  with diastolic flow reversal noted in the descending aorta.    Past Medical History:  Diagnosis Date   Asthma    as child   Cancer [facility])    prostate    Cataract    Dyspnea    exertional dyspnea   ED (erectile dysfunction)    Fracture    rt ankle   Headache    Hx Migraines   Heart murmur    Hemorrhoids    History of colonic polyps    History of prostate cancer    see's Dr. Janice Norrie   Pneumonia    50+ years ago    Past Surgical History:  Procedure Laterality Date   ANKLE FRACTURE SURGERY Left 2001   CATARACT EXTRACTION W/ INTRAOCULAR LENS IMPLANT Bilateral    per Dr. Lucita Ferrara    COLONOSCOPY  11/18/2018   per Dr. Hilarie Fredrickson, adenomatous polyps, repeat in 7 yrs   [place] Right 11/07/2013   RETINAL TEAR REPAIR CRYOTHERAPY Bilateral    per Dr. Tempie Hoist   RIGHT/LEFT HEART CATH AND CORONARY ANGIOGRAPHY N/A 02/13/2022   Procedure: RIGHT/LEFT HEART CATH AND CORONARY ANGIOGRAPHY;  Surgeon: Burnell Blanks, MD;  Location: [place] [facility] LAB;  Service: Cardiovascular;  Laterality: N/A;   rt bunion removed Left 1965    MEDICATIONS:  aspirin 81 MG tablet   b complex vitamins tablet   Cholecalciferol (DIALYVITE VITAMIN D 5000) 125 MCG (5000 UT) capsule   diclofenac (VOLTAREN) 75 MG EC tablet   PRESCRIPTION MEDICATION   vitamin A 8000 UNIT capsule   vitamin C (ASCORBIC ACID) 500 MG tablet   vitamin E 180 MG (400 UNITS) capsule   No current facility-administered medications for this encounter.    [START ON 03/10/2022] ceFAZolin (ANCEF) IVPB 2g/100 mL premix   [START ON 03/10/2022] ceFAZolin (ANCEF) IVPB 2g/100 mL premix   [START ON 03/10/2022] dexmedetomidine (PRECEDEX) 400 MCG/100ML (4 mcg/mL) infusion   [START ON 03/10/2022] EPINEPHrine (ADRENALIN) 5 mg in NS 250 mL (0.02 mg/mL) premix infusion   [START ON 03/10/2022]  heparin 30,000 units/NS 1000 mL solution for CELLSAVER   [START ON 03/10/2022] heparin sodium (porcine) 2,500 Units, papaverine 30 mg in electrolyte-A (PLASMALYTE-A PH 7.4) 500 mL irrigation   [START ON 03/10/2022] insulin regular, human (MYXREDLIN) 100 units/ 100 mL infusion   [START ON 03/10/2022] [place] Blood Cardioplegia vial (lidocaine/magnesium/mannitol 0.26g-4g-6.4g)   [START ON 03/10/2022] milrinone (PRIMACOR) 20 MG/100 ML (0.2 mg/mL) infusion   [START ON 03/10/2022] nitroGLYCERIN 50 mg in dextrose 5 % 250 mL (0.2 mg/mL) infusion   [START ON 03/10/2022] norepinephrine (LEVOPHED) '4mg'$  in 259m (0.016 mg/mL) premix infusion   [START ON 03/10/2022] phenylephrine (NEO-SYNEPHRINE) '20mg'$ /NS 2564mpremix infusion   [START ON 03/10/2022] potassium chloride injection 80 mEq   [START ON 03/10/2022] tranexamic acid (CYKLOKAPRON) 2,500 mg in sodium chloride 0.9 % 250  mL (10 mg/mL) infusion   [START ON 03/10/2022] tranexamic acid (CYKLOKAPRON) bolus via infusion - over 30 minutes 1,266 mg   [START ON 03/10/2022] tranexamic acid (CYKLOKAPRON) pump prime solution 169 mg   [START ON 03/10/2022] vancomycin (VANCOREADY) IVPB 1500 mg/300 mL    Myra Gianotti, PA-C Surgical Short Stay/Anesthesiology [facility] Phone [phone] [facility] Phone [phone] 03/07/2022 2:13 PM

## 2022-03-08 LAB — SARS CORONAVIRUS 2 (TAT 6-24 HRS): SARS Coronavirus 2: NEGATIVE

## 2022-03-10 ENCOUNTER — Encounter (HOSPITAL_COMMUNITY): Payer: Self-pay | Admitting: Thoracic Surgery (Cardiothoracic Vascular Surgery)

## 2022-03-10 ENCOUNTER — Inpatient Hospital Stay (HOSPITAL_COMMUNITY): Payer: Medicare HMO

## 2022-03-10 ENCOUNTER — Other Ambulatory Visit: Payer: Self-pay

## 2022-03-10 ENCOUNTER — Encounter (HOSPITAL_COMMUNITY)
Admission: RE | Disposition: A | Discharge status: Home / Self Care | Payer: Self-pay | Source: Home / Self Care | Attending: Thoracic Surgery (Cardiothoracic Vascular Surgery)

## 2022-03-10 ENCOUNTER — Inpatient Hospital Stay (HOSPITAL_COMMUNITY)
Admission: RE | Admit: 2022-03-10 | Discharge: 2022-03-16 | DRG: 219 | Disposition: A | Discharge status: Home / Self Care | Payer: Medicare HMO | Attending: Thoracic Surgery (Cardiothoracic Vascular Surgery) | Admitting: Thoracic Surgery (Cardiothoracic Vascular Surgery)

## 2022-03-10 ENCOUNTER — Inpatient Hospital Stay (HOSPITAL_COMMUNITY): Payer: Medicare HMO | Admitting: Vascular Surgery

## 2022-03-10 ENCOUNTER — Inpatient Hospital Stay (HOSPITAL_COMMUNITY): Payer: Medicare HMO | Admitting: Registered Nurse

## 2022-03-10 DIAGNOSIS — J90 Pleural effusion, not elsewhere classified: Secondary | ICD-10-CM | POA: Diagnosis not present

## 2022-03-10 DIAGNOSIS — D62 Acute posthemorrhagic anemia: Secondary | ICD-10-CM | POA: Diagnosis not present

## 2022-03-10 DIAGNOSIS — R000 Tachycardia, unspecified: Secondary | ICD-10-CM | POA: Diagnosis not present

## 2022-03-10 DIAGNOSIS — R0600 Dyspnea, unspecified: Secondary | ICD-10-CM | POA: Diagnosis not present

## 2022-03-10 DIAGNOSIS — D696 Thrombocytopenia, unspecified: Secondary | ICD-10-CM | POA: Diagnosis not present

## 2022-03-10 DIAGNOSIS — Z820 Family history of epilepsy and other diseases of the nervous system: Secondary | ICD-10-CM

## 2022-03-10 DIAGNOSIS — Z86010 Personal history of colonic polyps: Secondary | ICD-10-CM | POA: Diagnosis not present

## 2022-03-10 DIAGNOSIS — Z9841 Cataract extraction status, right eye: Secondary | ICD-10-CM | POA: Diagnosis not present

## 2022-03-10 DIAGNOSIS — J9811 Atelectasis: Secondary | ICD-10-CM | POA: Diagnosis not present

## 2022-03-10 DIAGNOSIS — I959 Hypotension, unspecified: Secondary | ICD-10-CM | POA: Diagnosis not present

## 2022-03-10 DIAGNOSIS — I351 Nonrheumatic aortic (valve) insufficiency: Secondary | ICD-10-CM

## 2022-03-10 DIAGNOSIS — I951 Orthostatic hypotension: Secondary | ICD-10-CM | POA: Diagnosis present

## 2022-03-10 DIAGNOSIS — R918 Other nonspecific abnormal finding of lung field: Secondary | ICD-10-CM | POA: Diagnosis not present

## 2022-03-10 DIAGNOSIS — J45909 Unspecified asthma, uncomplicated: Secondary | ICD-10-CM

## 2022-03-10 DIAGNOSIS — Z9079 Acquired absence of other genital organ(s): Secondary | ICD-10-CM

## 2022-03-10 DIAGNOSIS — R578 Other shock: Secondary | ICD-10-CM | POA: Diagnosis not present

## 2022-03-10 DIAGNOSIS — Z7982 Long term (current) use of aspirin: Secondary | ICD-10-CM

## 2022-03-10 DIAGNOSIS — Z8042 Family history of malignant neoplasm of prostate: Secondary | ICD-10-CM

## 2022-03-10 DIAGNOSIS — Z8546 Personal history of malignant neoplasm of prostate: Secondary | ICD-10-CM | POA: Diagnosis not present

## 2022-03-10 DIAGNOSIS — J939 Pneumothorax, unspecified: Secondary | ICD-10-CM | POA: Diagnosis not present

## 2022-03-10 DIAGNOSIS — Z9842 Cataract extraction status, left eye: Secondary | ICD-10-CM | POA: Diagnosis not present

## 2022-03-10 DIAGNOSIS — R571 Hypovolemic shock: Secondary | ICD-10-CM | POA: Diagnosis not present

## 2022-03-10 DIAGNOSIS — Z79899 Other long term (current) drug therapy: Secondary | ICD-10-CM

## 2022-03-10 DIAGNOSIS — Z823 Family history of stroke: Secondary | ICD-10-CM

## 2022-03-10 DIAGNOSIS — Z1152 Encounter for screening for COVID-19: Secondary | ICD-10-CM | POA: Diagnosis not present

## 2022-03-10 DIAGNOSIS — Z952 Presence of prosthetic heart valve: Principal | ICD-10-CM

## 2022-03-10 DIAGNOSIS — I10 Essential (primary) hypertension: Secondary | ICD-10-CM | POA: Diagnosis not present

## 2022-03-10 DIAGNOSIS — I083 Combined rheumatic disorders of mitral, aortic and tricuspid valves: Secondary | ICD-10-CM | POA: Diagnosis not present

## 2022-03-10 DIAGNOSIS — D689 Coagulation defect, unspecified: Secondary | ICD-10-CM | POA: Diagnosis not present

## 2022-03-10 DIAGNOSIS — Z961 Presence of intraocular lens: Secondary | ICD-10-CM | POA: Diagnosis not present

## 2022-03-10 DIAGNOSIS — R339 Retention of urine, unspecified: Secondary | ICD-10-CM | POA: Diagnosis not present

## 2022-03-10 DIAGNOSIS — Z800 Family history of malignant neoplasm of digestive organs: Secondary | ICD-10-CM

## 2022-03-10 DIAGNOSIS — Z006 Encounter for examination for normal comparison and control in clinical research program: Secondary | ICD-10-CM

## 2022-03-10 DIAGNOSIS — Z881 Allergy status to other antibiotic agents status: Secondary | ICD-10-CM | POA: Diagnosis not present

## 2022-03-10 DIAGNOSIS — E8770 Fluid overload, unspecified: Secondary | ICD-10-CM | POA: Diagnosis not present

## 2022-03-10 DIAGNOSIS — Z803 Family history of malignant neoplasm of breast: Secondary | ICD-10-CM

## 2022-03-10 HISTORY — PX: TEE WITHOUT CARDIOVERSION: SHX5443

## 2022-03-10 HISTORY — PX: AORTIC VALVE REPLACEMENT: SHX41

## 2022-03-10 HISTORY — DX: Dyspnea, unspecified: R06.00

## 2022-03-10 LAB — POCT I-STAT, CHEM 8
BUN: 12 mg/dL (ref 8–23)
BUN: 11 mg/dL (ref 8–23)
BUN: 13 mg/dL (ref 8–23)
BUN: 12 mg/dL (ref 8–23)
BUN: 12 mg/dL (ref 8–23)
Calcium, Ion: 1.18 mmol/L (ref 1.15–1.40)
Calcium, Ion: 0.95 mmol/L — ABNORMAL LOW (ref 1.15–1.40)
Calcium, Ion: 1.13 mmol/L — ABNORMAL LOW (ref 1.15–1.40)
Calcium, Ion: 1.07 mmol/L — ABNORMAL LOW (ref 1.15–1.40)
Calcium, Ion: 1.22 mmol/L (ref 1.15–1.40)
Chloride: 104 mmol/L (ref 98–111)
Chloride: 102 mmol/L (ref 98–111)
Chloride: 98 mmol/L (ref 98–111)
Chloride: 101 mmol/L (ref 98–111)
Chloride: 102 mmol/L (ref 98–111)
Creatinine, Ser: 0.6 mg/dL — ABNORMAL LOW (ref 0.61–1.24)
Creatinine, Ser: 0.5 mg/dL — ABNORMAL LOW (ref 0.61–1.24)
Creatinine, Ser: 0.7 mg/dL (ref 0.61–1.24)
Creatinine, Ser: 0.6 mg/dL — ABNORMAL LOW (ref 0.61–1.24)
Creatinine, Ser: 0.6 mg/dL — ABNORMAL LOW (ref 0.61–1.24)
Glucose, Bld: 94 mg/dL (ref 70–99)
Glucose, Bld: 107 mg/dL — ABNORMAL HIGH (ref 70–99)
Glucose, Bld: 91 mg/dL (ref 70–99)
Glucose, Bld: 138 mg/dL — ABNORMAL HIGH (ref 70–99)
Glucose, Bld: 146 mg/dL — ABNORMAL HIGH (ref 70–99)
HCT: 38 % — ABNORMAL LOW (ref 39.0–52.0)
HCT: 31 % — ABNORMAL LOW (ref 39.0–52.0)
HCT: 36 % — ABNORMAL LOW (ref 39.0–52.0)
HCT: 33 % — ABNORMAL LOW (ref 39.0–52.0)
HCT: 29 % — ABNORMAL LOW (ref 39.0–52.0)
Hemoglobin: 12.9 g/dL — ABNORMAL LOW (ref 13.0–17.0)
Hemoglobin: 10.5 g/dL — ABNORMAL LOW (ref 13.0–17.0)
Hemoglobin: 12.2 g/dL — ABNORMAL LOW (ref 13.0–17.0)
Hemoglobin: 11.2 g/dL — ABNORMAL LOW (ref 13.0–17.0)
Hemoglobin: 9.9 g/dL — ABNORMAL LOW (ref 13.0–17.0)
Potassium: 3.6 mmol/L (ref 3.5–5.1)
Potassium: 3.6 mmol/L (ref 3.5–5.1)
Potassium: 3.9 mmol/L (ref 3.5–5.1)
Potassium: 4.3 mmol/L (ref 3.5–5.1)
Potassium: 3.7 mmol/L (ref 3.5–5.1)
Sodium: 141 mmol/L (ref 135–145)
Sodium: 139 mmol/L (ref 135–145)
Sodium: 140 mmol/L (ref 135–145)
Sodium: 139 mmol/L (ref 135–145)
Sodium: 138 mmol/L (ref 135–145)
TCO2: 28 mmol/L (ref 22–32)
TCO2: 22 mmol/L (ref 22–32)
TCO2: 26 mmol/L (ref 22–32)
TCO2: 26 mmol/L (ref 22–32)
TCO2: 28 mmol/L (ref 22–32)

## 2022-03-10 LAB — POCT I-STAT 7, (LYTES, BLD GAS, ICA,H+H)
Acid-Base Excess: 0 mmol/L (ref 0.0–2.0)
Acid-Base Excess: 0 mmol/L (ref 0.0–2.0)
Acid-base deficit: 1 mmol/L (ref 0.0–2.0)
Acid-base deficit: 3 mmol/L — ABNORMAL HIGH (ref 0.0–2.0)
Acid-base deficit: 3 mmol/L — ABNORMAL HIGH (ref 0.0–2.0)
Acid-base deficit: 4 mmol/L — ABNORMAL HIGH (ref 0.0–2.0)
Bicarbonate: 21 mmol/L (ref 20.0–28.0)
Bicarbonate: 23.4 mmol/L (ref 20.0–28.0)
Bicarbonate: 23.7 mmol/L (ref 20.0–28.0)
Bicarbonate: 24.1 mmol/L (ref 20.0–28.0)
Bicarbonate: 25 mmol/L (ref 20.0–28.0)
Bicarbonate: 25.8 mmol/L (ref 20.0–28.0)
Calcium, Ion: 0.93 mmol/L — ABNORMAL LOW (ref 1.15–1.40)
Calcium, Ion: 1.09 mmol/L — ABNORMAL LOW (ref 1.15–1.40)
Calcium, Ion: 1.14 mmol/L — ABNORMAL LOW (ref 1.15–1.40)
Calcium, Ion: 1.18 mmol/L (ref 1.15–1.40)
Calcium, Ion: 1.19 mmol/L (ref 1.15–1.40)
Calcium, Ion: 1.23 mmol/L (ref 1.15–1.40)
HCT: 20 % — ABNORMAL LOW (ref 39.0–52.0)
HCT: 24 % — ABNORMAL LOW (ref 39.0–52.0)
HCT: 30 % — ABNORMAL LOW (ref 39.0–52.0)
HCT: 31 % — ABNORMAL LOW (ref 39.0–52.0)
HCT: 32 % — ABNORMAL LOW (ref 39.0–52.0)
HCT: 39 % (ref 39.0–52.0)
Hemoglobin: 10.2 g/dL — ABNORMAL LOW (ref 13.0–17.0)
Hemoglobin: 10.5 g/dL — ABNORMAL LOW (ref 13.0–17.0)
Hemoglobin: 10.9 g/dL — ABNORMAL LOW (ref 13.0–17.0)
Hemoglobin: 13.3 g/dL (ref 13.0–17.0)
Hemoglobin: 6.8 g/dL — CL (ref 13.0–17.0)
Hemoglobin: 8.2 g/dL — ABNORMAL LOW (ref 13.0–17.0)
O2 Saturation: 100 %
O2 Saturation: 100 %
O2 Saturation: 100 %
O2 Saturation: 95 %
O2 Saturation: 98 %
O2 Saturation: 99 %
Patient temperature: 35.8
Patient temperature: 35.9
Patient temperature: 37.7
Potassium: 3.6 mmol/L (ref 3.5–5.1)
Potassium: 3.7 mmol/L (ref 3.5–5.1)
Potassium: 3.7 mmol/L (ref 3.5–5.1)
Potassium: 3.8 mmol/L (ref 3.5–5.1)
Potassium: 3.9 mmol/L (ref 3.5–5.1)
Potassium: 4 mmol/L (ref 3.5–5.1)
Sodium: 137 mmol/L (ref 135–145)
Sodium: 138 mmol/L (ref 135–145)
Sodium: 139 mmol/L (ref 135–145)
Sodium: 141 mmol/L (ref 135–145)
Sodium: 142 mmol/L (ref 135–145)
Sodium: 142 mmol/L (ref 135–145)
TCO2: 22 mmol/L (ref 22–32)
TCO2: 25 mmol/L (ref 22–32)
TCO2: 25 mmol/L (ref 22–32)
TCO2: 25 mmol/L (ref 22–32)
TCO2: 26 mmol/L (ref 22–32)
TCO2: 27 mmol/L (ref 22–32)
pCO2 arterial: 35.3 mmHg (ref 32–48)
pCO2 arterial: 39.3 mmHg (ref 32–48)
pCO2 arterial: 40 mmHg (ref 32–48)
pCO2 arterial: 43.6 mmHg (ref 32–48)
pCO2 arterial: 47.7 mmHg (ref 32–48)
pCO2 arterial: 47.9 mmHg (ref 32–48)
pH, Arterial: 7.296 — ABNORMAL LOW (ref 7.35–7.45)
pH, Arterial: 7.332 — ABNORMAL LOW (ref 7.35–7.45)
pH, Arterial: 7.34 — ABNORMAL LOW (ref 7.35–7.45)
pH, Arterial: 7.382 (ref 7.35–7.45)
pH, Arterial: 7.391 (ref 7.35–7.45)
pH, Arterial: 7.413 (ref 7.35–7.45)
pO2, Arterial: 109 mmHg — ABNORMAL HIGH (ref 83–108)
pO2, Arterial: 157 mmHg — ABNORMAL HIGH (ref 83–108)
pO2, Arterial: 376 mmHg — ABNORMAL HIGH (ref 83–108)
pO2, Arterial: 384 mmHg — ABNORMAL HIGH (ref 83–108)
pO2, Arterial: 543 mmHg — ABNORMAL HIGH (ref 83–108)
pO2, Arterial: 82 mmHg — ABNORMAL LOW (ref 83–108)

## 2022-03-10 LAB — GLUCOSE, CAPILLARY
Glucose-Capillary: 106 mg/dL — ABNORMAL HIGH (ref 70–99)
Glucose-Capillary: 113 mg/dL — ABNORMAL HIGH (ref 70–99)
Glucose-Capillary: 121 mg/dL — ABNORMAL HIGH (ref 70–99)
Glucose-Capillary: 126 mg/dL — ABNORMAL HIGH (ref 70–99)
Glucose-Capillary: 128 mg/dL — ABNORMAL HIGH (ref 70–99)
Glucose-Capillary: 129 mg/dL — ABNORMAL HIGH (ref 70–99)
Glucose-Capillary: 134 mg/dL — ABNORMAL HIGH (ref 70–99)
Glucose-Capillary: 138 mg/dL — ABNORMAL HIGH (ref 70–99)
Glucose-Capillary: 143 mg/dL — ABNORMAL HIGH (ref 70–99)
Glucose-Capillary: 90 mg/dL (ref 70–99)

## 2022-03-10 LAB — CBC
HCT: 31.1 % — ABNORMAL LOW (ref 39.0–52.0)
HCT: 28.2 % — ABNORMAL LOW (ref 39.0–52.0)
HCT: 23.2 % — ABNORMAL LOW (ref 39.0–52.0)
Hemoglobin: 10.4 g/dL — ABNORMAL LOW (ref 13.0–17.0)
Hemoglobin: 9.4 g/dL — ABNORMAL LOW (ref 13.0–17.0)
Hemoglobin: 7.8 g/dL — ABNORMAL LOW (ref 13.0–17.0)
MCH: 30.5 pg (ref 26.0–34.0)
MCH: 30.6 pg (ref 26.0–34.0)
MCH: 30.6 pg (ref 26.0–34.0)
MCHC: 33.4 g/dL (ref 30.0–36.0)
MCHC: 33.3 g/dL (ref 30.0–36.0)
MCHC: 33.6 g/dL (ref 30.0–36.0)
MCV: 91.2 fL (ref 80.0–100.0)
MCV: 91.9 fL (ref 80.0–100.0)
MCV: 91 fL (ref 80.0–100.0)
Platelets: 95 10*3/uL — ABNORMAL LOW (ref 150–400)
Platelets: 114 10*3/uL — ABNORMAL LOW (ref 150–400)
Platelets: 124 10*3/uL — ABNORMAL LOW (ref 150–400)
RBC: 3.41 MIL/uL — ABNORMAL LOW (ref 4.22–5.81)
RBC: 3.07 MIL/uL — ABNORMAL LOW (ref 4.22–5.81)
RBC: 2.55 MIL/uL — ABNORMAL LOW (ref 4.22–5.81)
RDW: 12.6 % (ref 11.5–15.5)
RDW: 12.8 % (ref 11.5–15.5)
RDW: 12.7 % (ref 11.5–15.5)
WBC: 10.4 10*3/uL (ref 4.0–10.5)
WBC: 12.6 10*3/uL — ABNORMAL HIGH (ref 4.0–10.5)
WBC: 13.5 10*3/uL — ABNORMAL HIGH (ref 4.0–10.5)
nRBC: 0 % (ref 0.0–0.2)
nRBC: 0.2 % (ref 0.0–0.2)
nRBC: 0 % (ref 0.0–0.2)

## 2022-03-10 LAB — BASIC METABOLIC PANEL
Anion gap: 4 — ABNORMAL LOW (ref 5–15)
BUN: 9 mg/dL (ref 8–23)
CO2: 27 mmol/L (ref 22–32)
Calcium: 7.5 mg/dL — ABNORMAL LOW (ref 8.9–10.3)
Chloride: 107 mmol/L (ref 98–111)
Creatinine, Ser: 0.87 mg/dL (ref 0.61–1.24)
GFR, Estimated: >60 mL/min (ref >60)
Glucose, Bld: 129 mg/dL — ABNORMAL HIGH (ref 70–99)
Potassium: 4.1 mmol/L (ref 3.5–5.1)
Sodium: 138 mmol/L (ref 135–145)

## 2022-03-10 LAB — PROTIME-INR
INR: 1.7 — ABNORMAL HIGH (ref 0.8–1.2)
INR: 1.5 — ABNORMAL HIGH (ref 0.8–1.2)
Prothrombin Time: 19.7 seconds — ABNORMAL HIGH (ref 11.4–15.2)
Prothrombin Time: 18 seconds — ABNORMAL HIGH (ref 11.4–15.2)

## 2022-03-10 LAB — ABO/RH: ABO/RH(D): O POS

## 2022-03-10 LAB — FIBRINOGEN
Fibrinogen: 141 mg/dL — ABNORMAL LOW (ref 210–475)
Fibrinogen: 170 mg/dL — ABNORMAL LOW (ref 210–475)

## 2022-03-10 LAB — HEMOGLOBIN AND HEMATOCRIT, BLOOD
HCT: 32.4 % — ABNORMAL LOW (ref 39.0–52.0)
HCT: 27.2 % — ABNORMAL LOW (ref 39.0–52.0)
Hemoglobin: 11.3 g/dL — ABNORMAL LOW (ref 13.0–17.0)
Hemoglobin: 9.1 g/dL — ABNORMAL LOW (ref 13.0–17.0)

## 2022-03-10 LAB — ECHO INTRAOPERATIVE TEE
Height: 68 in
Weight: 3072 oz

## 2022-03-10 LAB — PLATELET COUNT: Platelets: 147 10*3/uL — ABNORMAL LOW (ref 150–400)

## 2022-03-10 LAB — APTT
aPTT: 48 seconds — ABNORMAL HIGH (ref 24–36)
aPTT: 32 seconds (ref 24–36)

## 2022-03-10 LAB — MAGNESIUM: Magnesium: 2.6 mg/dL — ABNORMAL HIGH (ref 1.7–2.4)

## 2022-03-10 SURGERY — REPLACEMENT, AORTIC VALVE, OPEN
Anesthesia: General | Site: Chest

## 2022-03-10 MED ORDER — CHLORHEXIDINE GLUCONATE CLOTH 2 % EX PADS
6.0000 | MEDICATED_PAD | Freq: Every day | CUTANEOUS | Status: DC
  Administered 2022-03-10 – 2022-03-16 (×6): 6 via TOPICAL

## 2022-03-10 MED ORDER — FENTANYL CITRATE (PF) 250 MCG/5ML IJ SOLN
INTRAMUSCULAR | Status: AC
  Filled 2022-03-10: qty 5

## 2022-03-10 MED ORDER — ROCURONIUM BROMIDE 10 MG/ML (PF) SYRINGE
PREFILLED_SYRINGE | INTRAVENOUS | Status: AC
  Filled 2022-03-10: qty 10

## 2022-03-10 MED ORDER — ALBUMIN HUMAN 5 % IV SOLN: INTRAVENOUS | Status: DC | PRN

## 2022-03-10 MED ORDER — CEFAZOLIN SODIUM-DEXTROSE 2-4 GM/100ML-% IV SOLN
2.0000 g | Freq: Three times a day (TID) | INTRAVENOUS | Status: AC
  Administered 2022-03-10 – 2022-03-12 (×6): 2 g via INTRAVENOUS
  Filled 2022-03-10 (×6): qty 100

## 2022-03-10 MED ORDER — SODIUM CHLORIDE (PF) 0.9 % IJ SOLN
OROMUCOSAL | Status: DC | PRN
  Administered 2022-03-10: 8 mL via TOPICAL

## 2022-03-10 MED ORDER — LACTATED RINGERS IV SOLN: INTRAVENOUS | Status: DC

## 2022-03-10 MED ORDER — VANCOMYCIN HCL IN DEXTROSE 1-5 GM/200ML-% IV SOLN
1000.0000 mg | Freq: Once | INTRAVENOUS | Status: AC
  Administered 2022-03-10: 1000 mg via INTRAVENOUS
  Filled 2022-03-10: qty 200

## 2022-03-10 MED ORDER — CHLORHEXIDINE GLUCONATE 4 % EX LIQD: 30.0000 mL | CUTANEOUS | Status: DC

## 2022-03-10 MED ORDER — DEXTROSE 50 % IV SOLN: 0.0000 mL | INTRAVENOUS | Status: DC | PRN

## 2022-03-10 MED ORDER — DOCUSATE SODIUM 100 MG PO CAPS
200.0000 mg | ORAL_CAPSULE | Freq: Every day | ORAL | Status: DC
  Administered 2022-03-11 – 2022-03-14 (×4): 200 mg via ORAL
  Filled 2022-03-10 (×4): qty 2

## 2022-03-10 MED ORDER — CHLORHEXIDINE GLUCONATE 0.12 % MT SOLN
15.0000 mL | Freq: Once | OROMUCOSAL | Status: AC
  Administered 2022-03-10: 15 mL via OROMUCOSAL

## 2022-03-10 MED ORDER — SODIUM CHLORIDE 0.9% FLUSH
3.0000 mL | Freq: Two times a day (BID) | INTRAVENOUS | Status: DC
  Administered 2022-03-11 – 2022-03-14 (×5): 3 mL via INTRAVENOUS

## 2022-03-10 MED ORDER — ONDANSETRON HCL 4 MG/2ML IJ SOLN
4.0000 mg | Freq: Four times a day (QID) | INTRAMUSCULAR | Status: DC | PRN
  Administered 2022-03-11 – 2022-03-12 (×2): 4 mg via INTRAVENOUS
  Filled 2022-03-10 (×2): qty 2

## 2022-03-10 MED ORDER — TRAMADOL HCL 50 MG PO TABS
50.0000 mg | ORAL_TABLET | ORAL | Status: DC | PRN
  Administered 2022-03-11 – 2022-03-14 (×10): 100 mg via ORAL
  Filled 2022-03-10 (×10): qty 2

## 2022-03-10 MED ORDER — METOPROLOL TARTRATE 12.5 MG HALF TABLET
12.5000 mg | ORAL_TABLET | Freq: Two times a day (BID) | ORAL | Status: DC
  Administered 2022-03-11 (×2): 12.5 mg via ORAL
  Filled 2022-03-10 (×2): qty 1

## 2022-03-10 MED ORDER — FAMOTIDINE IN NACL 20-0.9 MG/50ML-% IV SOLN
20.0000 mg | Freq: Two times a day (BID) | INTRAVENOUS | Status: AC
  Administered 2022-03-10 (×2): 20 mg via INTRAVENOUS
  Filled 2022-03-10 (×2): qty 50

## 2022-03-10 MED ORDER — ASPIRIN 81 MG PO CHEW: 324.0000 mg | CHEWABLE_TABLET | Freq: Every day | ORAL | Status: DC

## 2022-03-10 MED ORDER — POTASSIUM CHLORIDE 10 MEQ/50ML IV SOLN
10.0000 meq | INTRAVENOUS | Status: AC
  Administered 2022-03-10 (×3): 10 meq via INTRAVENOUS

## 2022-03-10 MED ORDER — PANTOPRAZOLE SODIUM 40 MG PO TBEC
40.0000 mg | DELAYED_RELEASE_TABLET | Freq: Every day | ORAL | Status: DC
  Administered 2022-03-12 – 2022-03-16 (×5): 40 mg via ORAL
  Filled 2022-03-10 (×5): qty 1

## 2022-03-10 MED ORDER — PROPOFOL 10 MG/ML IV BOLUS
INTRAVENOUS | Status: DC | PRN
  Administered 2022-03-10: 40 mg via INTRAVENOUS
  Administered 2022-03-10: 100 mg via INTRAVENOUS
  Administered 2022-03-10: 20 mg via INTRAVENOUS
  Administered 2022-03-10: 40 mg via INTRAVENOUS
  Administered 2022-03-10 (×2): 20 mg via INTRAVENOUS

## 2022-03-10 MED ORDER — SODIUM CHLORIDE 0.9 % IV SOLN: INTRAVENOUS | Status: DC

## 2022-03-10 MED ORDER — ORAL CARE MOUTH RINSE: 15.0000 mL | Freq: Once | OROMUCOSAL | Status: AC

## 2022-03-10 MED ORDER — PROPOFOL 10 MG/ML IV BOLUS
INTRAVENOUS | Status: AC
  Filled 2022-03-10: qty 20

## 2022-03-10 MED ORDER — MIDAZOLAM HCL 5 MG/5ML IJ SOLN
INTRAMUSCULAR | Status: DC | PRN
  Administered 2022-03-10 (×2): 1 mg via INTRAVENOUS
  Administered 2022-03-10: 3 mg via INTRAVENOUS
  Administered 2022-03-10 (×3): 1 mg via INTRAVENOUS
  Administered 2022-03-10: 2 mg via INTRAVENOUS

## 2022-03-10 MED ORDER — FENTANYL CITRATE (PF) 250 MCG/5ML IJ SOLN
INTRAMUSCULAR | Status: DC | PRN
  Administered 2022-03-10 (×2): 50 ug via INTRAVENOUS
  Administered 2022-03-10 (×2): 100 ug via INTRAVENOUS
  Administered 2022-03-10: 50 ug via INTRAVENOUS
  Administered 2022-03-10: 100 ug via INTRAVENOUS
  Administered 2022-03-10: 250 ug via INTRAVENOUS
  Administered 2022-03-10: 100 ug via INTRAVENOUS
  Administered 2022-03-10: 50 ug via INTRAVENOUS
  Administered 2022-03-10 (×2): 100 ug via INTRAVENOUS
  Administered 2022-03-10 (×2): 50 ug via INTRAVENOUS
  Administered 2022-03-10: 100 ug via INTRAVENOUS
  Administered 2022-03-10: 150 ug via INTRAVENOUS
  Administered 2022-03-10: 50 ug via INTRAVENOUS

## 2022-03-10 MED ORDER — DOBUTAMINE IN D5W 4-5 MG/ML-% IV SOLN
0.0000 ug/kg/min | INTRAVENOUS | Status: DC
  Administered 2022-03-10: 2.5 ug/kg/min via INTRAVENOUS
  Filled 2022-03-10: qty 250

## 2022-03-10 MED ORDER — PROTAMINE SULFATE 10 MG/ML IV SOLN
INTRAVENOUS | Status: AC
  Filled 2022-03-10: qty 5

## 2022-03-10 MED ORDER — DEXMEDETOMIDINE HCL IN NACL 400 MCG/100ML IV SOLN
0.0000 ug/kg/h | INTRAVENOUS | Status: DC
  Administered 2022-03-10: 0.7 ug/kg/h via INTRAVENOUS
  Administered 2022-03-10 (×2): 1 ug/kg/h via INTRAVENOUS
  Filled 2022-03-10 (×2): qty 100

## 2022-03-10 MED ORDER — NOREPINEPHRINE 4 MG/250ML-% IV SOLN
0.0000 ug/min | INTRAVENOUS | Status: DC
  Administered 2022-03-10: 2 ug/min via INTRAVENOUS

## 2022-03-10 MED ORDER — 0.9 % SODIUM CHLORIDE (POUR BTL) OPTIME
TOPICAL | Status: DC | PRN
  Administered 2022-03-10: 5000 mL

## 2022-03-10 MED ORDER — CHLORHEXIDINE GLUCONATE 0.12 % MT SOLN
15.0000 mL | Freq: Once | OROMUCOSAL | Status: AC
  Administered 2022-03-10: 15 mL via OROMUCOSAL
  Filled 2022-03-10: qty 15

## 2022-03-10 MED ORDER — PHENYLEPHRINE 80 MCG/ML (10ML) SYRINGE FOR IV PUSH (FOR BLOOD PRESSURE SUPPORT)
PREFILLED_SYRINGE | INTRAVENOUS | Status: DC | PRN
  Administered 2022-03-10: 80 ug via INTRAVENOUS

## 2022-03-10 MED ORDER — ALBUMIN HUMAN 5 % IV SOLN
250.0000 mL | INTRAVENOUS | Status: AC | PRN
  Administered 2022-03-10 (×4): 12.5 g via INTRAVENOUS
  Filled 2022-03-10 (×2): qty 250

## 2022-03-10 MED ORDER — BISACODYL 10 MG RE SUPP: 10.0000 mg | Freq: Every day | RECTAL | Status: DC

## 2022-03-10 MED ORDER — ACETAMINOPHEN 160 MG/5ML PO SOLN: 650.0000 mg | Freq: Once | ORAL | Status: AC

## 2022-03-10 MED ORDER — OXYCODONE HCL 5 MG PO TABS
5.0000 mg | ORAL_TABLET | ORAL | Status: DC | PRN
  Administered 2022-03-10: 10 mg via ORAL
  Administered 2022-03-11: 5 mg via ORAL
  Administered 2022-03-11 (×3): 10 mg via ORAL
  Administered 2022-03-11: 5 mg via ORAL
  Administered 2022-03-11 – 2022-03-16 (×8): 10 mg via ORAL
  Filled 2022-03-10 (×2): qty 2
  Filled 2022-03-10 (×2): qty 1
  Filled 2022-03-10 (×10): qty 2

## 2022-03-10 MED ORDER — METOPROLOL TARTRATE 25 MG/10 ML ORAL SUSPENSION: 12.5000 mg | Freq: Two times a day (BID) | ORAL | Status: DC

## 2022-03-10 MED ORDER — NICARDIPINE HCL IN NACL 20-0.86 MG/200ML-% IV SOLN: 0.0000 mg/h | INTRAVENOUS | Status: DC

## 2022-03-10 MED ORDER — PROTAMINE SULFATE 10 MG/ML IV SOLN
INTRAVENOUS | Status: DC | PRN
  Administered 2022-03-10: 10 mg via INTRAVENOUS
  Administered 2022-03-10: 300 mg via INTRAVENOUS

## 2022-03-10 MED ORDER — MIDAZOLAM HCL 2 MG/2ML IJ SOLN
2.0000 mg | INTRAMUSCULAR | Status: DC | PRN
  Administered 2022-03-10 (×4): 2 mg via INTRAVENOUS
  Filled 2022-03-10 (×4): qty 2

## 2022-03-10 MED ORDER — NICARDIPINE HCL IN NACL 20-0.86 MG/200ML-% IV SOLN
INTRAVENOUS | Status: DC | PRN
  Administered 2022-03-10: 2 mg/h via INTRAVENOUS

## 2022-03-10 MED ORDER — PHENYLEPHRINE HCL-NACL 20-0.9 MG/250ML-% IV SOLN: 0.0000 ug/min | INTRAVENOUS | Status: DC

## 2022-03-10 MED ORDER — ROCURONIUM BROMIDE 10 MG/ML (PF) SYRINGE
PREFILLED_SYRINGE | INTRAVENOUS | Status: DC | PRN
  Administered 2022-03-10: 50 mg via INTRAVENOUS
  Administered 2022-03-10: 70 mg via INTRAVENOUS
  Administered 2022-03-10: 20 mg via INTRAVENOUS
  Administered 2022-03-10: 30 mg via INTRAVENOUS

## 2022-03-10 MED ORDER — BISACODYL 5 MG PO TBEC
10.0000 mg | DELAYED_RELEASE_TABLET | Freq: Every day | ORAL | Status: DC
  Administered 2022-03-11 – 2022-03-14 (×4): 10 mg via ORAL
  Filled 2022-03-10 (×4): qty 2

## 2022-03-10 MED ORDER — SODIUM CHLORIDE 0.9 % IV SOLN: 250.0000 mL | INTRAVENOUS | Status: DC

## 2022-03-10 MED ORDER — SODIUM CHLORIDE 0.9 % IV SOLN
20.0000 ug | Freq: Once | INTRAVENOUS | Status: AC
  Administered 2022-03-10: 20 ug via INTRAVENOUS
  Filled 2022-03-10: qty 5

## 2022-03-10 MED ORDER — MIDAZOLAM HCL (PF) 10 MG/2ML IJ SOLN
INTRAMUSCULAR | Status: AC
  Filled 2022-03-10: qty 2

## 2022-03-10 MED ORDER — MAGNESIUM SULFATE 4 GM/100ML IV SOLN
4.0000 g | Freq: Once | INTRAVENOUS | Status: AC
  Administered 2022-03-10: 4 g via INTRAVENOUS
  Filled 2022-03-10: qty 100

## 2022-03-10 MED ORDER — CHLORHEXIDINE GLUCONATE 0.12 % MT SOLN
15.0000 mL | OROMUCOSAL | Status: AC
  Administered 2022-03-10: 15 mL via OROMUCOSAL
  Filled 2022-03-10: qty 15

## 2022-03-10 MED ORDER — PROTAMINE SULFATE 10 MG/ML IV SOLN
INTRAVENOUS | Status: AC
  Filled 2022-03-10: qty 25

## 2022-03-10 MED ORDER — SODIUM CHLORIDE 0.9% IV SOLUTION: Freq: Once | INTRAVENOUS | Status: AC

## 2022-03-10 MED ORDER — ACETAMINOPHEN 160 MG/5ML PO SOLN
1000.0000 mg | Freq: Four times a day (QID) | ORAL | Status: AC
  Administered 2022-03-10: 1000 mg
  Filled 2022-03-10: qty 40.6

## 2022-03-10 MED ORDER — PLASMA-LYTE A IV SOLN: INTRAVENOUS | Status: DC | PRN

## 2022-03-10 MED ORDER — PHENYLEPHRINE 80 MCG/ML (10ML) SYRINGE FOR IV PUSH (FOR BLOOD PRESSURE SUPPORT)
PREFILLED_SYRINGE | INTRAVENOUS | Status: AC
  Filled 2022-03-10: qty 10

## 2022-03-10 MED ORDER — LACTATED RINGERS IV SOLN: INTRAVENOUS | Status: DC | PRN

## 2022-03-10 MED ORDER — HEPARIN SODIUM (PORCINE) 1000 UNIT/ML IJ SOLN
INTRAMUSCULAR | Status: AC
  Filled 2022-03-10: qty 1

## 2022-03-10 MED ORDER — DOBUTAMINE IN D5W 4-5 MG/ML-% IV SOLN: 2.5000 ug/kg/min | INTRAVENOUS | Status: DC

## 2022-03-10 MED ORDER — SODIUM CHLORIDE 0.9% FLUSH: 3.0000 mL | INTRAVENOUS | Status: DC | PRN

## 2022-03-10 MED ORDER — ORAL CARE MOUTH RINSE
15.0000 mL | OROMUCOSAL | Status: DC
  Administered 2022-03-10 (×3): 15 mL via OROMUCOSAL

## 2022-03-10 MED ORDER — LACTATED RINGERS IV SOLN: 500.0000 mL | Freq: Once | INTRAVENOUS | Status: DC | PRN

## 2022-03-10 MED ORDER — SODIUM CHLORIDE 0.45 % IV SOLN: INTRAVENOUS | Status: DC | PRN

## 2022-03-10 MED ORDER — ACETAMINOPHEN 500 MG PO TABS
1000.0000 mg | ORAL_TABLET | Freq: Four times a day (QID) | ORAL | Status: AC
  Administered 2022-03-11 – 2022-03-15 (×16): 1000 mg via ORAL
  Filled 2022-03-10 (×15): qty 2

## 2022-03-10 MED ORDER — SODIUM BICARBONATE 8.4 % IV SOLN
50.0000 meq | Freq: Once | INTRAVENOUS | Status: AC
  Administered 2022-03-10: 50 meq via INTRAVENOUS

## 2022-03-10 MED ORDER — METOPROLOL TARTRATE 12.5 MG HALF TABLET
12.5000 mg | ORAL_TABLET | Freq: Once | ORAL | Status: DC
  Filled 2022-03-10: qty 1

## 2022-03-10 MED ORDER — CHLORHEXIDINE GLUCONATE 0.12 % MT SOLN: 15.0000 mL | Freq: Once | OROMUCOSAL | Status: DC

## 2022-03-10 MED ORDER — ORAL CARE MOUTH RINSE: 15.0000 mL | OROMUCOSAL | Status: DC | PRN

## 2022-03-10 MED ORDER — METOPROLOL TARTRATE 5 MG/5ML IV SOLN
2.5000 mg | INTRAVENOUS | Status: DC | PRN
  Administered 2022-03-11: 2.5 mg via INTRAVENOUS
  Administered 2022-03-16: 5 mg via INTRAVENOUS
  Filled 2022-03-10 (×2): qty 5

## 2022-03-10 MED ORDER — HEPARIN SODIUM (PORCINE) 1000 UNIT/ML IJ SOLN
INTRAMUSCULAR | Status: DC | PRN
  Administered 2022-03-10: 31000 [IU] via INTRAVENOUS

## 2022-03-10 MED ORDER — ACETAMINOPHEN 650 MG RE SUPP
650.0000 mg | Freq: Once | RECTAL | Status: AC
  Administered 2022-03-10: 650 mg via RECTAL

## 2022-03-10 MED ORDER — INSULIN REGULAR(HUMAN) IN NACL 100-0.9 UT/100ML-% IV SOLN: INTRAVENOUS | Status: DC

## 2022-03-10 MED ORDER — MORPHINE SULFATE (PF) 2 MG/ML IV SOLN
1.0000 mg | INTRAVENOUS | Status: DC | PRN
  Filled 2022-03-10 (×2): qty 1

## 2022-03-10 MED ORDER — ASPIRIN 325 MG PO TBEC
325.0000 mg | DELAYED_RELEASE_TABLET | Freq: Every day | ORAL | Status: DC
  Administered 2022-03-11 – 2022-03-16 (×6): 325 mg via ORAL
  Filled 2022-03-10 (×6): qty 1

## 2022-03-10 MED ORDER — ~~LOC~~ CARDIAC SURGERY, PATIENT & FAMILY EDUCATION
Freq: Once | Status: DC
  Filled 2022-03-10: qty 1

## 2022-03-10 SURGICAL SUPPLY — 90 items
ADAPTER CARDIO PERF ANTE/RETRO (ADAPTER) ×2 IMPLANT
ADPR PRFSN 84XANTGRD RTRGD (ADAPTER) ×2
BAG DECANTER FOR FLEXI CONT (MISCELLANEOUS) ×2 IMPLANT
BLADE CLIPPER SURG (BLADE) ×2 IMPLANT
BLADE STERNUM SYSTEM 6 (BLADE) ×2 IMPLANT
BLADE SURG 15 STRL LF DISP TIS (BLADE) ×2 IMPLANT
BLADE SURG 15 STRL SS (BLADE) ×2
CANISTER SUCT 3000ML PPV (MISCELLANEOUS) ×2 IMPLANT
CANNULA GUNDRY RCSP 15FR (MISCELLANEOUS) ×2 IMPLANT
CANNULA MC2 2 STG 29/37 NON-V (CANNULA) IMPLANT
CANNULA MC2 TWO STAGE (CANNULA) ×2
CANNULA NON VENT 20FR 12 (CANNULA) ×2 IMPLANT
CATH CPB KIT HENDRICKSON (MISCELLANEOUS) ×2 IMPLANT
CATH HEART VENT LEFT (CATHETERS) ×2 IMPLANT
CATH ROBINSON RED A/P 18FR (CATHETERS) ×6 IMPLANT
CNTNR URN SCR LID CUP LEK RST (MISCELLANEOUS) ×2 IMPLANT
CONN ST 1/2X1/2  BEN (MISCELLANEOUS)
CONN ST 1/2X1/2 BEN (MISCELLANEOUS) ×2 IMPLANT
CONN ST 1/4X3/8  BEN (MISCELLANEOUS)
CONN ST 1/4X3/8 BEN (MISCELLANEOUS) ×4 IMPLANT
CONN ST 3/8 X 1/2 (MISCELLANEOUS) IMPLANT
CONNECTOR BLAKE 2:1 CARIO BLK (MISCELLANEOUS) IMPLANT
CONT SPEC 4OZ STRL OR WHT (MISCELLANEOUS) ×2
CONTAINER PROTECT SURGISLUSH (MISCELLANEOUS) ×4 IMPLANT
COVER SURGICAL LIGHT HANDLE (MISCELLANEOUS) ×2 IMPLANT
DEVICE SUT CK QUICK LOAD INDV (Prosthesis & Implant Heart) IMPLANT
DEVICE SUT CK QUICK LOAD MINI (Prosthesis & Implant Heart) IMPLANT
DRAIN CHANNEL 19F RND (DRAIN) IMPLANT
DRAIN CHANNEL 28F RND 3/8 FF (WOUND CARE) ×4 IMPLANT
DRAPE CARDIOVASCULAR INCISE (DRAPES) ×2
DRAPE SRG 135X102X78XABS (DRAPES) ×2 IMPLANT
DRAPE WARM FLUID 44X44 (DRAPES) ×2 IMPLANT
DRSG AQUACEL AG ADV 3.5X10 (GAUZE/BANDAGES/DRESSINGS) IMPLANT
DRSG COVADERM 4X14 (GAUZE/BANDAGES/DRESSINGS) ×2 IMPLANT
ELECT CAUTERY BLADE 6.4 (BLADE) ×2 IMPLANT
ELECT REM PT RETURN 9FT ADLT (ELECTROSURGICAL) ×4
ELECTRODE REM PT RTRN 9FT ADLT (ELECTROSURGICAL) ×4 IMPLANT
FELT TEFLON 1X6 (MISCELLANEOUS) ×4 IMPLANT
GAUZE 4X4 16PLY ~~LOC~~+RFID DBL (SPONGE) ×2 IMPLANT
GAUZE SPONGE 4X4 12PLY STRL (GAUZE/BANDAGES/DRESSINGS) ×2 IMPLANT
GLOVE BIO SURGEON STRL SZ 6 (GLOVE) IMPLANT
GLOVE BIO SURGEON STRL SZ 6.5 (GLOVE) IMPLANT
GLOVE BIO SURGEON STRL SZ7 (GLOVE) ×6 IMPLANT
GLOVE BIO SURGEON STRL SZ7.5 (GLOVE) IMPLANT
GLOVE BIOGEL PI IND STRL 6.5 (GLOVE) IMPLANT
GLOVE EUDERMIC 7 POWDERFREE (GLOVE) ×4 IMPLANT
GLOVE SS BIOGEL STRL SZ 7.5 (GLOVE) IMPLANT
GOWN STRL REUS W/ TWL LRG LVL3 (GOWN DISPOSABLE) ×8 IMPLANT
GOWN STRL REUS W/ TWL XL LVL3 (GOWN DISPOSABLE) ×4 IMPLANT
GOWN STRL REUS W/TWL LRG LVL3 (GOWN DISPOSABLE) ×12
GOWN STRL REUS W/TWL XL LVL3 (GOWN DISPOSABLE) ×4
HEMOSTAT POWDER SURGIFOAM 1G (HEMOSTASIS) ×6 IMPLANT
HEMOSTAT SURGICEL 2X14 (HEMOSTASIS) ×2 IMPLANT
INSERT SUTURE HOLDER (MISCELLANEOUS) ×2 IMPLANT
KIT BASIN OR (CUSTOM PROCEDURE TRAY) ×2 IMPLANT
KIT SUCTION CATH 14FR (SUCTIONS) ×2 IMPLANT
KIT SUT CK MINI COMBO 4X17 (Prosthesis & Implant Heart) IMPLANT
KIT TURNOVER KIT B (KITS) ×2 IMPLANT
LEAD PACING MYOCARDI (MISCELLANEOUS) IMPLANT
LINE VENT (MISCELLANEOUS) IMPLANT
NS IRRIG 1000ML POUR BTL (IV SOLUTION) ×12 IMPLANT
ORGANIZER SUTURE GABBAY-FRATER (MISCELLANEOUS) IMPLANT
PACK E OPEN HEART (SUTURE) ×2 IMPLANT
PACK OPEN HEART (CUSTOM PROCEDURE TRAY) ×2 IMPLANT
PAD ARMBOARD 7.5X6 YLW CONV (MISCELLANEOUS) ×4 IMPLANT
POSITIONER HEAD DONUT 9IN (MISCELLANEOUS) ×2 IMPLANT
SET MPS 3-ND DEL (MISCELLANEOUS) IMPLANT
SPONGE T-LAP 18X18 ~~LOC~~+RFID (SPONGE) ×8 IMPLANT
SUT BONE WAX W31G (SUTURE) ×2 IMPLANT
SUT EB EXC GRN/WHT 2-0 V-5 (SUTURE) ×4 IMPLANT
SUT ETHIBOND 2-0 30 1/2 V-5 (SUTURE) IMPLANT
SUT PROLENE 3 0 SH DA (SUTURE) IMPLANT
SUT PROLENE 3 0 SH1 36 (SUTURE) ×2 IMPLANT
SUT PROLENE 4 0 RB 1 (SUTURE) ×6
SUT PROLENE 4 0 SH DA (SUTURE) IMPLANT
SUT PROLENE 4-0 RB1 .5 CRCL 36 (SUTURE) ×6 IMPLANT
SUT STEEL 6MS V (SUTURE) IMPLANT
SUT STEEL STERNAL CCS#1 18IN (SUTURE) IMPLANT
SYSTEM SAHARA CHEST DRAIN ATS (WOUND CARE) ×2 IMPLANT
TAPE CLOTH SURG 4X10 WHT LF (GAUZE/BANDAGES/DRESSINGS) IMPLANT
TAPE PAPER 2X10 WHT MICROPORE (GAUZE/BANDAGES/DRESSINGS) IMPLANT
TOWEL GREEN STERILE (TOWEL DISPOSABLE) ×2 IMPLANT
TOWEL GREEN STERILE FF (TOWEL DISPOSABLE) ×2 IMPLANT
TRAY FOLEY SLVR 14FR TEMP STAT (SET/KITS/TRAYS/PACK) IMPLANT
TRAY FOLEY SLVR 16FR TEMP STAT (SET/KITS/TRAYS/PACK) ×2 IMPLANT
TRAY WAYNE PNEUMOTHORAX 14X18 (TRAY / TRAY PROCEDURE) IMPLANT
UNDERPAD 30X36 HEAVY ABSORB (UNDERPADS AND DIAPERS) ×2 IMPLANT
VALVE AORTIC SZ25 INSP/RESIL (Prosthesis & Implant Heart) IMPLANT
VENT LEFT HEART 12002 (CATHETERS) ×2
WATER STERILE IRR 1000ML POUR (IV SOLUTION) ×4 IMPLANT

## 2022-03-10 NOTE — Interval H&P Note (Signed)
History and Physical Interval Note:  03/10/2022 7:33 AM  Matthew [name]  has presented today for surgery, with the diagnosis of Severe AI.  The various methods of treatment have been discussed with the patient and family. After consideration of risks, benefits and other options for treatment, the patient has consented to  Procedure(s): AORTIC VALVE REPLACEMENT (AVR) (N/A) TRANSESOPHAGEAL ECHOCARDIOGRAM (N/A) as a surgical intervention.  The patient's history has been reviewed, patient examined, no change in status, stable for surgery.  I have reviewed the patient's chart and labs.  Questions were answered to the patient's satisfaction.     Kabrea Seeney Bary Leriche

## 2022-03-10 NOTE — Anesthesia Procedure Notes (Signed)
Central Venous Catheter Insertion Performed by: Effie Berkshire, MD, anesthesiologist Start/End3/04/2022 7:00 AM, 03/10/2022 7:10 AM Patient location: Pre-op. Preanesthetic checklist: patient identified, IV checked, site marked, risks and benefits discussed, surgical consent, monitors and equipment checked, pre-op evaluation, timeout performed and anesthesia consent Position: Trendelenburg Lidocaine 1% used for infiltration and patient sedated Hand hygiene performed , maximum sterile barriers used  and Seldinger technique used Catheter size: 8.5 Fr Total catheter length 10. Central line was placed.Sheath introducer Swan type:thermodilution PA Cath depth:50 Procedure performed using ultrasound guided technique. Ultrasound Notes:anatomy identified, needle tip was noted to be adjacent to the nerve/plexus identified, no ultrasound evidence of intravascular and/or intraneural injection and image(s) printed for medical record Attempts: 1 Following insertion, line sutured, dressing applied and Biopatch. Post procedure assessment: blood return through all ports, free fluid flow and no air  Patient tolerated the procedure well with no immediate complications.

## 2022-03-10 NOTE — Anesthesia Procedure Notes (Addendum)
Arterial Line Insertion Start/End3/04/2022 7:02 AM, 03/10/2022 7:08 AM Performed by: Betha Loa, CRNA, CRNA  Patient location: Pre-op. Preanesthetic checklist: patient identified, IV checked, site marked, risks and benefits discussed, surgical consent, monitors and equipment checked, pre-op evaluation, timeout performed and anesthesia consent Left, radial was placed Catheter size: 20 G Hand hygiene performed  and maximum sterile barriers used   Attempts: 1 Procedure performed without using ultrasound guided technique. Following insertion, dressing applied and Biopatch. Post procedure assessment: normal  Patient tolerated the procedure well with no immediate complications.

## 2022-03-10 NOTE — Brief Op Note (Signed)
03/10/2022  9:24 AM  PATIENT:  Matthew [name]  72 y.o. male  PRE-OPERATIVE DIAGNOSIS:  Severe Aortic Insufficiency  POST-OPERATIVE DIAGNOSIS:  Severe Aortic Insufficiency  PROCEDURE:  Procedure(s): AORTIC VALVE REPLACEMENT WITH 25MM EDWARDS INSPIRIS VALVE (N/A) TRANSESOPHAGEAL ECHOCARDIOGRAM (N/A)  SURGEON:  Surgeon(s) and Role:    * Lightfoot, Lucile Crater, MD - Primary  PHYSICIAN ASSISTANT: Sharday Michl PA-C   ASSISTANTS: MALLORY HARDY RNFA   ANESTHESIA:   general  EBL:  800 mL   BLOOD ADMINISTERED:none  DRAINS:  MEDIASTINAL CHEST TUBES    LOCAL MEDICATIONS USED:  NONE  SPECIMEN:  Source of Specimen:  AORTIC VALVE LEAFLETS  DISPOSITION OF SPECIMEN:  PATHOLOGY  COUNTS:  YES  TOURNIQUET:  * No tourniquets in log *  DICTATION: .Dragon Dictation  PLAN OF CARE: Admit to inpatient   PATIENT DISPOSITION:  ICU - intubated and hemodynamically stable.   Delay start of Pharmacological VTE agent (>24hrs) due to surgical blood loss or risk of bleeding: yes  COMPLICATIONS: NO KNOWN

## 2022-03-10 NOTE — Anesthesia Postprocedure Evaluation (Signed)
Anesthesia Post Note  Patient: Matthew [name]  Procedure(s) Performed: AORTIC VALVE REPLACEMENT WITH 25MM EDWARDS INSPIRIS VALVE (Chest) TRANSESOPHAGEAL ECHOCARDIOGRAM     Patient location during evaluation: SICU Anesthesia Type: General Level of consciousness: sedated Pain management: pain level controlled Vital Signs Assessment: post-procedure vital signs reviewed and stable Respiratory status: patient remains intubated per anesthesia plan Cardiovascular status: stable Postop Assessment: no apparent nausea or vomiting Anesthetic complications: no  No notable events documented.  Last Vitals:  Vitals:   03/10/22 1715 03/10/22 1730  BP: 112/76 115/72  Pulse: 87 84  Resp: 18 18  Temp: 37.6 C 37.7 C  SpO2: 100% 100%    Last Pain:  Vitals:   03/10/22 1714  TempSrc: Core  PainSc:                  Effie Berkshire

## 2022-03-10 NOTE — Anesthesia Preprocedure Evaluation (Addendum)
Anesthesia Evaluation  Patient identified by MRN, date of birth, ID band Patient awake    Reviewed: Allergy & Precautions, NPO status , Patient's Chart, lab work & pertinent test results  Airway Mallampati: I  TM Distance: >3 FB Neck ROM: Full    Dental  (+) Teeth Intact, Dental Advisory Given   Pulmonary asthma    breath sounds clear to auscultation       Cardiovascular + Valvular Problems/Murmurs AI  Rhythm:Regular Rate:Normal + Diastolic murmurs Echo:  1. The aortic valve is tricuspid. There is mild calcification of the  aortic valve. There is mild thickening of the aortic valve. Aortic valve  regurgitation is severe with diastolic flow reversal noted in the  descending aorta. Aortic valve  sclerosis/calcification is present, without any evidence of aortic  stenosis. Elevated gradients across the aortic valve likely driven by  severe AR.   2. Left ventricular ejection fraction, by estimation, is 60 to 65%. The  left ventricle has normal function. The left ventricle has no regional  wall motion abnormalities. Left ventricular diastolic parameters are  consistent with Grade I diastolic  dysfunction (impaired relaxation).   3. Right ventricular systolic function is normal. The right ventricular  size is normal. Tricuspid regurgitation signal is inadequate for assessing  PA pressure.   4. Left atrial size was mildly dilated.   5. The mitral valve is grossly normal. Trivial mitral valve  regurgitation.   6. Pulmonic valve regurgitation is moderate.   7. Aortic dilatation noted. There is mild dilatation of the aortic root,  measuring 41 mm. There is mild dilatation of the ascending aorta,  measuring 42 mm.   8. The inferior vena cava is normal in size with greater than 50%  respiratory variability, suggesting right atrial pressure of 3 mmHg.     Neuro/Psych  Headaches  negative psych ROS   GI/Hepatic negative GI ROS, Neg  liver ROS,,,  Endo/Other    Renal/GU negative Renal ROS     Musculoskeletal   Abdominal   Peds  Hematology   Anesthesia Other Findings   Reproductive/Obstetrics                             Anesthesia Physical Anesthesia Plan  ASA: 4  Anesthesia Plan: General   Post-op Pain Management:    Induction: Intravenous  PONV Risk Score and Plan: 2 and Ondansetron, Midazolam and Dexamethasone  Airway Management Planned:   Additional Equipment: Arterial line, TEE and Ultrasound Guidance Line Placement  Intra-op Plan:   Post-operative Plan: Post-operative intubation/ventilation  Informed Consent: I have reviewed the patients History and Physical, chart, labs and discussed the procedure including the risks, benefits and alternatives for the proposed anesthesia with the patient or authorized representative who has indicated his/her understanding and acceptance.       Plan Discussed with: CRNA  Anesthesia Plan Comments:        Anesthesia Quick Evaluation

## 2022-03-10 NOTE — Progress Notes (Signed)
NAME:  Matthew [name], MRN:  [id number], DOB:  [birth year], LOS: 0 ADMISSION DATE:  03/10/2022, CONSULTATION DATE:  03/10/2022 REFERRING MD:  Kipp Brood, CHIEF COMPLAINT:  post AVR   History of Present Illness:  72 year old man who underwent BPAVR for severe AI. He presented with SOBOE (NYHA class 2).  Otherwise healthy.   Normal RV/LV post procedure. High SVR and low SV - given fluid. Intermittently needed Cardene for hypertension.  Dobutamine started for inotropic support.    Pertinent  Medical History   Past Medical History:  Diagnosis Date   Asthma    as child   Cancer [facility])    prostate    Cataract    Dyspnea    exertional dyspnea   ED (erectile dysfunction)    Fracture    rt ankle   Headache    Hx Migraines   Heart murmur    Hemorrhoids    History of colonic polyps    History of prostate cancer    see's Dr. Janice Norrie   Pneumonia    50+ years ago     Significant Hospital Events: Including procedures, antibiotic start and stop dates in addition to other pertinent events   3/4 - AVR   Interim History / Subjective:  Low BP on arrival. Cardene stopped and fluid given.   Objective   Blood pressure (!) 159/85, pulse 60, temperature 98.2 F (36.8 C), temperature source Oral, resp. rate 18, height '5\' 8"'$  (1.727 m), weight 87.1 kg, SpO2 97 %.        Intake/Output Summary (Last 24 hours) at 03/10/2022 1246 Last data filed at 03/10/2022 1209 Gross per 24 hour  Intake 2582 ml  Output 2050 ml  Net 532 ml   Filed Weights   03/06/22 1351 03/10/22 0601 03/10/22 0619  Weight: 84.4 kg 87.1 kg 87.1 kg    Examination: General: average build.  HENT: OGT, ETT in place Lungs: clear Cardiovascular: midline incision, normal HS with no rub or murmur.  Acceptable chest tube output with no air leak.  Extremities warm Abdomen: soft. Extremities: no edema Neuro: sedated  GU: clear urine  Ancillary tests personally reviewed:  ABG 7.96/47/82, HB 10.9 CXR shows all support devices  in place with no residual pneumothorax Assessment & Plan:  Severe AI now status post AVR Post operative mechanical ventilation Distributive and hypovolemic shock Small pneumothorax, left - S/P chest tube.   Plan:   - Fluid resuscitation - Wean dobutamine to keep CI >2.2 - Titrate phenylephrine to keep MAP > 65 - Multimodal pain control - Wean ventilator per fast track protocol.  - chest tube to suction until extubated.    Best Practice (right click and "Reselect all SmartList Selections" daily)   Diet/type: NPO w/ oral meds DVT prophylaxis: SCD GI prophylaxis: H2B Lines: Central line and Arterial Line Foley:  Yes, and it is still needed Code Status:  full code Last date of multidisciplinary goals of care discussion [per TCTS]   CRITICAL CARE Performed by: Kipp Brood   Total critical care time: 35 minutes  Critical care time was exclusive of separately billable procedures and treating other patients.  Critical care was necessary to treat or prevent imminent or life-threatening deterioration.  Critical care was time spent personally by me on the following activities: development of treatment plan with patient and/or surrogate as well as nursing, discussions with consultants, evaluation of patient's response to treatment, examination of patient, obtaining history from patient or surrogate, ordering and performing treatments and  interventions, ordering and review of laboratory studies, ordering and review of radiographic studies, pulse oximetry, re-evaluation of patient's condition and participation in multidisciplinary rounds.  Kipp Brood, MD [facility] ICU Physician [place]  Pager: [phone] Mobile: [phone] After hours: [phone].

## 2022-03-10 NOTE — Progress Notes (Signed)
Rapid wean initiated per protocol  

## 2022-03-10 NOTE — Transfer of Care (Signed)
Immediate Anesthesia Transfer of Care Note  Patient: SANKALP GUHL  Procedure(s) Performed: AORTIC VALVE REPLACEMENT WITH 25MM EDWARDS INSPIRIS VALVE (Chest) TRANSESOPHAGEAL ECHOCARDIOGRAM  Patient Location: SICU  Anesthesia Type:General  Level of Consciousness: sedated and Patient remains intubated per anesthesia plan  Airway & Oxygen Therapy: Patient remains intubated per anesthesia plan and Patient placed on Ventilator (see vital sign flow sheet for setting)  Post-op Assessment: Report given to RN and Post -op Vital signs reviewed and stable  Post vital signs: Reviewed and stable  Last Vitals:  Vitals Value Taken Time  BP 96/62 03/10/22 1238  Temp 35.9 C 03/10/22 1256  Pulse 96 03/10/22 1256  Resp 16 03/10/22 1256  SpO2 97 % 03/10/22 1256  Vitals shown include unvalidated device data.  Last Pain:  Vitals:   03/10/22 0619  TempSrc: Oral  PainSc:          Complications: No notable events documented.

## 2022-03-10 NOTE — Op Note (Signed)
[address]       [place] [address]             [phone]                                           03/10/2022 Patient:  Matthew [name] Pre-Op Dx: Severe AI   Post-op Dx:  same Procedure: Aortic valve replacement with a 32m Inspiris Resilia valve     Surgeon and Role:      * Ember Gottwald, HLucile Crater MD - Primary    *Evonnie Pat  - PA-C  An experienced assistant was required given the complexity of this surgery and the standard of surgical care. The assistant was needed for exposure, dissection, suctioning, retraction of delicate tissues and sutures, instrument exchange and for overall help during this procedure.    Anesthesia  general EBL:  10071mBlood Administration: none Xclamp Time:  82 min Pump Time:  105 min  Drains: 19 F blake drain: R, L, mediastinal  Wires: Ventricular Counts: correct   Indications: [age]o male with severe AI.  Preserved biventricular function, and no coronary disease.  We discussed the risks and benefits of a bioprosthetic aortic valve replacement.  He is agreeable to proceed.   Findings: Poor coaptation of the right and non coronary leaflets.    Operative Technique: All invasive lines were placed in pre-op holding.  After the risks, benefits and alternatives were thoroughly discussed, the patient was brought to the operative theatre.  Anesthesia was induced, and the patient was prepped and draped in normal sterile fashion.  An appropriate surgical pause was performed, and pre-operative antibiotics were dosed accordingly.  We began with an incision over the chest for the sternotomy.  This was carried down with bovie cautery, and the sternum was divided with a reciprocating saw.  Meticulous hemostasis was obtained.  The patient was systemically heparinized.   The sternal retractor was placed.  The pericardium was divided in the midline and fashioned into a cradle with pericardial stitches.   After we confirmed an appropriate ACT,  the ascending aorta was cannulated in standard fashion.  The right atrial appendage was used for venous cannulation site.  Cardiopulmonary bypass was initiated and we began to cool the patient to 32 degrees. The cross clamp was applied, and a dose of anterograde cardioplegia was given with good arrest of the heart.  Our aortotomy was made and directed toward the non coronary cusp.  The valve was inspected.  All leaflets were excised.  The annulus was sized to a 2552malve.  The left ventricle was then copiously irrigated.  Pledgeted mattress sutures were placed circumferentially through the annulus.  These sutures were then passed through the sewing ring of the valve.  Once the valve was seated in the annulus, it was secured with Core-knot sutures.  We began to rewarm, and close our aortotomy in 2 layers.  A re-animation dose of cardioplegia was given.  After de-airing the heart, the aortic cross clamp was removed.  We checked our valve function, and for air using the TEE.  Once we were satisfying, we separated from cardiopulmonary bypass without event.    The heparin was reversed with protamine, and hemostasis was obtained.  Chest tubes and wires were placed, and the sternum was re-approximated with with sternal wires.  The soft tissue and  skin were re-approximated wth absorbable suture.    The patient tolerated the procedure without any immediate complications, and was transferred to the ICU in guarded condition.  Tenna Lacko Bary Leriche

## 2022-03-10 NOTE — Hospital Course (Addendum)
  Referring: Burnell Blanks* Primary Care: Laurey Morale, MD Primary Cardiologist:Christopher Angelena Form, MD  History of the present illness:  Patient is a 72 year old male referred to Dr. Kipp Brood for surgical evaluation of severe aortic insufficiency.  The patient describes dyspnea on exertion which developed in the fall of this year.  He notes that he tires more quickly than he has normally.  He does admit to occasional palpitations but denies any lower extremity swelling.  Echocardiogram revealed severe aortic insufficiency.  Dr. Kipp Brood evaluated the patient and all relevant studies and recommended proceeding with elective aortic valve replacement.  Hospital course: The patient was admitted electively on 03/10/2018 for taken to the operating room at which time he underwent aortic valve replacement with a #25 Inspiris Resilia bioprosthetic.  He tolerated the procedure well and was taken to the surgical intensive care unit in stable condition.  Postoperative hospital course: The patient was extubated using standard post cardiac surgical protocols without difficulty.  He has remained neurologically intact.  Chest tubes were showing moderate drainage and kept in place on postop day 1.  He does have an expected acute blood loss anemia which is being monitored closely.  On postop day 1 hemoglobin was 7.3.  He does show some expected postoperative volume overload.  Creatinine on postop day #1 was normal and he is started on a course of diuretics.  She developed orthostasis with ambulation.  She was transfused 1 unit of packed cells for anemia.  Her pacing wires were removed without difficulty.  His chest tubes were removed without difficulty.   Follow up CXR showed no evidence of pneumothorax.  He was treated with colchicine for possible inflammatory component to effusion.  He remains in NSR and was stable for transfer to the progressive care unit on 03/14/2022.  The patient was tachycardic and his  Lopressor dose was increased.  He was mildly volume overloaded and started on Lasix and potassium.  The patient is ambulating without difficulty.  His surgical incisions are healing without evidence on infection.  He is medically stable for discharge home today.

## 2022-03-10 NOTE — TOC Initial Note (Signed)
Transition of Care [facility]) - Initial/Assessment Note    Patient Details  Name: Matthew [name] MRN: [id number] Date of Birth: [birth year]  Transition [facility]) CM/SW Contact:    Erenest Rasher, RN Phone Number: [phone] 03/10/2022, 4:32 PM  Clinical Narrative:                 Spoke to wife at bedside. States pt was independent prior to admission. Will continue to follow for dc needs.   Expected Discharge Plan: IP Rehab Facility Barriers to Discharge: Continued Medical Work up   Patient Goals and CMS Choice Patient states their goals for this hospitalization and ongoing recovery are:: wants patient to recover          Expected Discharge Plan and Services   Discharge Planning Services: CM Consult Post Acute Care Choice: IP Rehab Living arrangements for the past 2 months: Single Family Home                                      Prior Living Arrangements/Services Living arrangements for the past 2 months: Single Family Home Lives with:: Spouse Patient language and need for interpreter reviewed:: Yes              Criminal Activity/Legal Involvement Pertinent to Current Situation/Hospitalization: No - Comment as needed  Activities of Daily Living Home Assistive Devices/Equipment: None    Permission Sought/Granted Permission sought to share information with : Case Manager, Family Supports    Share Information with NAME: Matthew [name]     Permission granted to share info w Relationship: wife  Permission granted to share info w Contact Information: [phone]  Emotional Assessment   Attitude/Demeanor/Rapport: Intubated (Following Commands or Not Following Commands)          Admission diagnosis:  S/P AVR (aortic valve replacement) [Z95.2] Patient Active Problem List   Diagnosis Date Noted   S/P AVR (aortic valve replacement) 03/10/2022   Nonrheumatic aortic valve insufficiency 02/13/2022   Erectile dysfunction 11/06/2017   GOUT, UNSPECIFIED  07/03/2008   Psychosexual dysfunction with inhibited sexual excitement 04/10/2008   Asthma 04/10/2008   SKIN TAG 04/10/2008   PROSTATE CANCER, HX OF 04/10/2008   COLONIC POLYPS, HX OF 04/10/2008   PCP:  Laurey Morale, MD Pharmacy:   [place] ([place]), [place] - [address] [place] ([place]) [place] [address] Phone: [phone] Fax: [phone]     Social Determinants of Health (SDOH) Social History: SDOH Screenings   Food Insecurity: No Food Insecurity (10/14/2021)  Housing: Low Risk  (10/14/2021)  Transportation Needs: No Transportation Needs (10/14/2021)  Alcohol Screen: Low Risk  (10/14/2021)  Depression (PHQ2-9): Low Risk  (10/15/2021)  Financial Resource Strain: Low Risk  (10/14/2021)  Physical Activity: Sufficiently Active (10/14/2021)  Social Connections: Socially Integrated (10/14/2021)  Stress: No Stress Concern Present (10/14/2021)  Tobacco Use: Low Risk  (03/10/2022)   SDOH Interventions:     Readmission Risk Interventions     No data to display

## 2022-03-11 ENCOUNTER — Other Ambulatory Visit: Payer: Self-pay | Admitting: Cardiology

## 2022-03-11 ENCOUNTER — Encounter (HOSPITAL_COMMUNITY): Payer: Self-pay | Admitting: Thoracic Surgery (Cardiothoracic Vascular Surgery)

## 2022-03-11 ENCOUNTER — Inpatient Hospital Stay (HOSPITAL_COMMUNITY): Payer: Medicare HMO

## 2022-03-11 DIAGNOSIS — Z952 Presence of prosthetic heart valve: Secondary | ICD-10-CM

## 2022-03-11 DIAGNOSIS — R0600 Dyspnea, unspecified: Secondary | ICD-10-CM | POA: Diagnosis not present

## 2022-03-11 LAB — BASIC METABOLIC PANEL
Anion gap: 7 (ref 5–15)
Anion gap: 9 (ref 5–15)
BUN: 12 mg/dL (ref 8–23)
BUN: 11 mg/dL (ref 8–23)
CO2: 25 mmol/L (ref 22–32)
CO2: 27 mmol/L (ref 22–32)
Calcium: 7.7 mg/dL — ABNORMAL LOW (ref 8.9–10.3)
Calcium: 8.2 mg/dL — ABNORMAL LOW (ref 8.9–10.3)
Chloride: 104 mmol/L (ref 98–111)
Chloride: 96 mmol/L — ABNORMAL LOW (ref 98–111)
Creatinine, Ser: 0.86 mg/dL (ref 0.61–1.24)
Creatinine, Ser: 0.94 mg/dL (ref 0.61–1.24)
GFR, Estimated: >60 mL/min (ref >60)
GFR, Estimated: >60 mL/min (ref >60)
Glucose, Bld: 134 mg/dL — ABNORMAL HIGH (ref 70–99)
Glucose, Bld: 170 mg/dL — ABNORMAL HIGH (ref 70–99)
Potassium: 3.8 mmol/L (ref 3.5–5.1)
Potassium: 3.4 mmol/L — ABNORMAL LOW (ref 3.5–5.1)
Sodium: 136 mmol/L (ref 135–145)
Sodium: 132 mmol/L — ABNORMAL LOW (ref 135–145)

## 2022-03-11 LAB — PREPARE PLATELET PHERESIS
Unit division: 0
Unit division: 0

## 2022-03-11 LAB — BPAM FFP
Blood Product Expiration Date: 202403072359
ISSUE DATE / TIME: 202403041527
Unit Type and Rh: 6200

## 2022-03-11 LAB — GLUCOSE, CAPILLARY
Glucose-Capillary: 123 mg/dL — ABNORMAL HIGH (ref 70–99)
Glucose-Capillary: 129 mg/dL — ABNORMAL HIGH (ref 70–99)
Glucose-Capillary: 131 mg/dL — ABNORMAL HIGH (ref 70–99)
Glucose-Capillary: 133 mg/dL — ABNORMAL HIGH (ref 70–99)
Glucose-Capillary: 135 mg/dL — ABNORMAL HIGH (ref 70–99)
Glucose-Capillary: 137 mg/dL — ABNORMAL HIGH (ref 70–99)
Glucose-Capillary: 141 mg/dL — ABNORMAL HIGH (ref 70–99)
Glucose-Capillary: 142 mg/dL — ABNORMAL HIGH (ref 70–99)
Glucose-Capillary: 143 mg/dL — ABNORMAL HIGH (ref 70–99)
Glucose-Capillary: 149 mg/dL — ABNORMAL HIGH (ref 70–99)
Glucose-Capillary: 149 mg/dL — ABNORMAL HIGH (ref 70–99)

## 2022-03-11 LAB — BPAM PLATELET PHERESIS
Blood Product Expiration Date: 202403042359
Blood Product Expiration Date: 202403072359
ISSUE DATE / TIME: 202403041631
Unit Type and Rh: 2800
Unit Type and Rh: 8400

## 2022-03-11 LAB — CBC
HCT: 21.6 % — ABNORMAL LOW (ref 39.0–52.0)
HCT: 22.9 % — ABNORMAL LOW (ref 39.0–52.0)
Hemoglobin: 7.3 g/dL — ABNORMAL LOW (ref 13.0–17.0)
Hemoglobin: 7.9 g/dL — ABNORMAL LOW (ref 13.0–17.0)
MCH: 30.5 pg (ref 26.0–34.0)
MCH: 31.3 pg (ref 26.0–34.0)
MCHC: 33.8 g/dL (ref 30.0–36.0)
MCHC: 34.5 g/dL (ref 30.0–36.0)
MCV: 90.4 fL (ref 80.0–100.0)
MCV: 90.9 fL (ref 80.0–100.0)
Platelets: 97 10*3/uL — ABNORMAL LOW (ref 150–400)
Platelets: 103 10*3/uL — ABNORMAL LOW (ref 150–400)
RBC: 2.39 MIL/uL — ABNORMAL LOW (ref 4.22–5.81)
RBC: 2.52 MIL/uL — ABNORMAL LOW (ref 4.22–5.81)
RDW: 12.8 % (ref 11.5–15.5)
RDW: 13 % (ref 11.5–15.5)
WBC: 10.7 10*3/uL — ABNORMAL HIGH (ref 4.0–10.5)
WBC: 14.1 10*3/uL — ABNORMAL HIGH (ref 4.0–10.5)
nRBC: 0 % (ref 0.0–0.2)
nRBC: 0 % (ref 0.0–0.2)

## 2022-03-11 LAB — PREPARE CRYOPRECIPITATE: Unit division: 0

## 2022-03-11 LAB — POCT I-STAT 7, (LYTES, BLD GAS, ICA,H+H)
Acid-Base Excess: 0 mmol/L (ref 0.0–2.0)
Bicarbonate: 25.1 mmol/L (ref 20.0–28.0)
Calcium, Ion: 1.16 mmol/L (ref 1.15–1.40)
HCT: 20 % — ABNORMAL LOW (ref 39.0–52.0)
Hemoglobin: 6.8 g/dL — CL (ref 13.0–17.0)
O2 Saturation: 98 %
Patient temperature: 38.1
Potassium: 3.9 mmol/L (ref 3.5–5.1)
Sodium: 139 mmol/L (ref 135–145)
TCO2: 26 mmol/L (ref 22–32)
pCO2 arterial: 43 mmHg (ref 32–48)
pH, Arterial: 7.38 (ref 7.35–7.45)
pO2, Arterial: 120 mmHg — ABNORMAL HIGH (ref 83–108)

## 2022-03-11 LAB — MAGNESIUM
Magnesium: 2.2 mg/dL (ref 1.7–2.4)
Magnesium: 2 mg/dL (ref 1.7–2.4)

## 2022-03-11 LAB — SURGICAL PATHOLOGY

## 2022-03-11 LAB — PREPARE FRESH FROZEN PLASMA: Unit division: 0

## 2022-03-11 LAB — BPAM CRYOPRECIPITATE
Blood Product Expiration Date: 202403082359
ISSUE DATE / TIME: 202403041633
Unit Type and Rh: 6200

## 2022-03-11 MED ORDER — SODIUM CHLORIDE 0.9 % IV SOLN
200.0000 mg | Freq: Once | INTRAVENOUS | Status: AC
  Administered 2022-03-11: 200 mg via INTRAVENOUS
  Filled 2022-03-11: qty 10

## 2022-03-11 MED ORDER — INFLUENZA VAC A&B SA ADJ QUAD 0.5 ML IM PRSY: 0.5000 mL | PREFILLED_SYRINGE | INTRAMUSCULAR | Status: DC | PRN

## 2022-03-11 MED ORDER — ORAL CARE MOUTH RINSE: 15.0000 mL | OROMUCOSAL | Status: DC | PRN

## 2022-03-11 MED ORDER — INSULIN DETEMIR 100 UNIT/ML ~~LOC~~ SOLN
5.0000 [IU] | Freq: Two times a day (BID) | SUBCUTANEOUS | Status: DC
  Filled 2022-03-11 (×2): qty 0.05

## 2022-03-11 MED ORDER — INSULIN ASPART 100 UNIT/ML IJ SOLN: 2.0000 [IU] | INTRAMUSCULAR | Status: DC

## 2022-03-11 MED ORDER — POTASSIUM CHLORIDE CRYS ER 20 MEQ PO TBCR
20.0000 meq | EXTENDED_RELEASE_TABLET | ORAL | Status: AC
  Administered 2022-03-11 – 2022-03-12 (×3): 20 meq via ORAL
  Filled 2022-03-11 (×3): qty 1

## 2022-03-11 MED ORDER — FUROSEMIDE 10 MG/ML IJ SOLN
40.0000 mg | Freq: Once | INTRAMUSCULAR | Status: AC
  Administered 2022-03-11: 40 mg via INTRAVENOUS
  Filled 2022-03-11: qty 4

## 2022-03-11 MED ORDER — INSULIN ASPART 100 UNIT/ML IJ SOLN
0.0000 [IU] | INTRAMUSCULAR | Status: DC
  Administered 2022-03-11 – 2022-03-12 (×9): 2 [IU] via SUBCUTANEOUS
  Administered 2022-03-13: 4 [IU] via SUBCUTANEOUS

## 2022-03-11 NOTE — Discharge Summary (Signed)
[address]       [place] [address]             [phone]    Physician Discharge Summary  Patient ID: Matthew [name] MRN: [id number] DOB/AGE: 03/28/1950 72 y.o.  Admit date: 03/10/2022 Discharge date: 03/16/2022  Admission Diagnoses:  Patient Active Problem List   Diagnosis Date Noted   Nonrheumatic aortic valve insufficiency 02/13/2022   Erectile dysfunction 11/06/2017   GOUT, UNSPECIFIED 07/03/2008   Psychosexual dysfunction with inhibited sexual excitement 04/10/2008   Asthma 04/10/2008   SKIN TAG 04/10/2008   PROSTATE CANCER, HX OF 04/10/2008   COLONIC POLYPS, HX OF 04/10/2008   Discharge Diagnoses:  Patient Active Problem List   Diagnosis Date Noted   S/P AVR (aortic valve replacement) 03/10/2022   Nonrheumatic aortic valve insufficiency 02/13/2022   Erectile dysfunction 11/06/2017   GOUT, UNSPECIFIED 07/03/2008   Psychosexual dysfunction with inhibited sexual excitement 04/10/2008   Asthma 04/10/2008   SKIN TAG 04/10/2008   PROSTATE CANCER, HX OF 04/10/2008   COLONIC POLYPS, HX OF 04/10/2008   Discharged Condition: good   Referring: Burnell Blanks* Primary Care: Laurey Morale, MD Primary Cardiologist:Christopher Angelena Form, MD  History of the present illness:  Patient is a 72 year old male referred to Dr. Kipp Brood for surgical evaluation of severe aortic insufficiency.  The patient describes dyspnea on exertion which developed in the fall of this year.  He notes that he tires more quickly than he has normally.  He does admit to occasional palpitations but denies any lower extremity swelling.  Echocardiogram revealed severe aortic insufficiency.  Dr. Kipp Brood evaluated the patient and all relevant studies and recommended proceeding with elective aortic valve replacement.  Hospital course: The patient was admitted electively on 03/10/2018 for taken to the operating room at which time he underwent aortic valve replacement with a  #25 Inspiris Resilia bioprosthetic.  He tolerated the procedure well and was taken to the surgical intensive care unit in stable condition.  Postoperative hospital course: The patient was extubated using standard post cardiac surgical protocols without difficulty.  He has remained neurologically intact.  Chest tubes were showing moderate drainage and kept in place on postop day 1.  He does have an expected acute blood loss anemia which is being monitored closely.  On postop day 1 hemoglobin was 7.3.  He does show some expected postoperative volume overload.  Creatinine on postop day #1 was normal and he is started on a course of diuretics.  She developed orthostasis with ambulation.  She was transfused 1 unit of packed cells for anemia.  Her pacing wires were removed without difficulty.  His chest tubes were removed without difficulty.   Follow up CXR showed no evidence of pneumothorax.  He was treated with colchicine for possible inflammatory component to effusion.  He remains in NSR and was stable for transfer to the progressive care unit on 03/14/2022.  The patient was tachycardic and his Lopressor dose was increased.  He was hypertensive and started on low dose Norvasc  He was mildly volume overloaded and started on Lasix and potassium.  The patient is ambulating without difficulty.  His surgical incisions are healing without evidence on infection.  He is medically stable for discharge home today.  Consults: pulmonary/intensive care  Significant Diagnostic Studies: cardiac graphics:   Echocardiogram:    IMPRESSIONS    1. The aortic valve is tricuspid. There is mild calcification of the  aortic valve. There is mild  thickening of the aortic valve. Aortic valve  regurgitation is severe with diastolic flow reversal noted in the  descending aorta. Aortic valve  sclerosis/calcification is present, without any evidence of aortic  stenosis. Elevated gradients across the aortic valve likely driven by   severe AR.   2. Left ventricular ejection fraction, by estimation, is 60 to 65%. The  left ventricle has normal function. The left ventricle has no regional  wall motion abnormalities. Left ventricular diastolic parameters are  consistent with Grade I diastolic  dysfunction (impaired relaxation).   3. Right ventricular systolic function is normal. The right ventricular  size is normal. Tricuspid regurgitation signal is inadequate for assessing  PA pressure.   4. Left atrial size was mildly dilated.   5. The mitral valve is grossly normal. Trivial mitral valve  regurgitation.   6. Pulmonic valve regurgitation is moderate.   7. Aortic dilatation noted. There is mild dilatation of the aortic root,  measuring 41 mm. There is mild dilatation of the ascending aorta,  measuring 42 mm.   8. The inferior vena cava is normal in size with greater than 50%  respiratory variability, suggesting right atrial pressure of 3 mmHg.   Treatments: surgery:   03/10/2022 Patient:  Matthew [name] Pre-Op Dx: Severe AI   Post-op Dx:  same Procedure: Aortic valve replacement with a 60m Inspiris Resilia valve       Surgeon and Role:      * Lightfoot, HLucile Crater MD - Primary    *Evonnie Pat  - PA-C  An experienced assistant was required given the complexity of this surgery and the standard of surgical care. The assistant was needed for exposure, dissection, suctioning, retraction of delicate tissues and sutures, instrument exchange and for overall help during this procedure.     Discharge Exam: Blood pressure (!) 155/94, pulse 94, temperature 98.7 F (37.1 C), temperature source Oral, resp. rate 18, height '5\' 8"'$  (1.727 m), weight 86.8 kg, SpO2 99 %.  General appearance: alert, cooperative, and no distress Heart: regular rate and rhythm Lungs: clear to auscultation bilaterally Abdomen: soft, non-tender; bowel sounds normal; no masses,  no organomegaly Extremities: extremities normal, atraumatic, no  cyanosis or edema Wound: clean and dry  Discharge Medications:  The patient has been discharged on:   1.Beta Blocker:  Yes [  X ]                              No   [   ]                              If No, reason:  2.Ace Inhibitor/ARB: Yes [   ]                                     No  [  x  ]                                     If No, reason: titration of BB  3.Statin:   Yes [   ]                  No  [ X  ]  If No, reason: no CAD  4.Shela CommonsVelta Addison  [ X  ]                  No   [   ]                  If No, reason:  Patient had ACS upon admission: No  Plavix/P2Y12 inhibitor: Yes [   ]                                      No  [  X ]     Discharge Instructions     Amb Referral to Cardiac Rehabilitation   Complete by: As directed    Diagnosis: Valve Replacement   Valve: Aortic   After initial evaluation and assessments completed: Virtual Based Care may be provided alone or in conjunction with Phase 2 Cardiac Rehab based on patient barriers.: Yes   Intensive Cardiac Rehabilitation (ICR) [place] location only OR Traditional Cardiac Rehabilitation (TCR) *If criteria for ICR are not met will enroll in TCR [facility] only): Yes      Allergies as of 03/16/2022       Reactions   Ciprofloxacin    headaches        Medication List     STOP taking these medications    aspirin 81 MG tablet Replaced by: aspirin EC 325 MG tablet       TAKE these medications    amLODipine 10 MG tablet Commonly known as: NORVASC Take 1 tablet (10 mg total) by mouth daily.   ascorbic acid 500 MG tablet Commonly known as: VITAMIN C Take 500 mg by mouth daily.   aspirin EC 325 MG tablet Take 1 tablet (325 mg total) by mouth daily. Replaces: aspirin 81 MG tablet   b complex vitamins tablet Take 1 tablet by mouth daily.   Dialyvite Vitamin D 5000 125 MCG (5000 UT) capsule Generic drug: Cholecalciferol Take 5,000 Units by mouth daily.   diclofenac 75 MG EC tablet Commonly  known as: VOLTAREN Take 1 tablet (75 mg total) by mouth 2 (two) times daily.   furosemide 40 MG tablet Commonly known as: LASIX Take 1 tablet (40 mg total) by mouth daily.   metoprolol tartrate 25 MG tablet Commonly known as: LOPRESSOR Take 1 tablet (25 mg total) by mouth 2 (two) times daily.   oxyCODONE 5 MG immediate release tablet Commonly known as: Oxy IR/ROXICODONE Take 1 tablet (5 mg total) by mouth every 4 (four) hours as needed for severe pain.   potassium chloride SA 20 MEQ tablet Commonly known as: KLOR-CON M Take 1 tablet (20 mEq total) by mouth daily.   PRESCRIPTION MEDICATION Inject 1 Dose as directed as needed (erectile dysfunction). papaverine-phentolamine-prostaglandin E1 penile injection   vitamin A 8000 UNIT capsule Take 8,000 Units by mouth daily.   vitamin E 180 MG (400 UNITS) capsule Take 400 Units by mouth daily.               Durable Medical Equipment  (From admission, onward)           Start     Ordered   03/15/22 1545  For home use only DME 4 wheeled rolling walker with seat  Once       Question:  Patient needs a walker to treat with the following condition  Answer:  Weakness   03/15/22  [place]     Lajuana Matte, MD Follow up on 03/21/2022.   Specialty: Cardiothoracic Surgery Why: This is a virtual appointment.  Dr Kipp Brood will call you around 2:00 Contact information: [address] [place] [address] [phone]         Marylu Lund., NP Follow up on 03/31/2022.   Specialty: Cardiology Why: Appointment is at 8:25 Contact information: [address] [place] [address] [phone]                 Signed:  Ellwood Handler, PA-C  03/16/2022, 9:14 AM

## 2022-03-11 NOTE — Progress Notes (Signed)
   NAME:  Matthew [name], MRN:  [id number], DOB:  August 30, 1950, LOS: 1 ADMISSION DATE:  03/10/2022, CONSULTATION DATE:  03/10/2022 REFERRING MD:  Kipp Brood, CHIEF COMPLAINT:  post AVR   History of Present Illness:  72 year old man who underwent BPAVR for severe AI. He presented with SOBOE (NYHA class 2).  Otherwise healthy.   Normal RV/LV post procedure. High SVR and low SV - given fluid. Intermittently needed Cardene for hypertension.  Dobutamine started for inotropic support.   Pertinent  Medical History   Past Medical History:  Diagnosis Date   Asthma    as child   Cancer [facility])    prostate    Cataract    Dyspnea    exertional dyspnea   ED (erectile dysfunction)    Fracture    rt ankle   Headache    Hx Migraines   Heart murmur    Hemorrhoids    History of colonic polyps    History of prostate cancer    see's Dr. Janice Norrie   Pneumonia    50+ years ago   Significant Hospital Events: Including procedures, antibiotic start and stop dates in addition to other pertinent events   3/4 - AVR   Interim History / Subjective:  Bleeding post op, corrected with product administration.  Extubated thereafter and given diuretics.  Denies chest pain this morning.   Objective   Blood pressure (!) 138/90, pulse 89, temperature (!) 100.6 F (38.1 C), resp. rate 18, height '5\' 8"'$  (1.727 m), weight 92.2 kg, SpO2 97 %. CVP:  [0 mmHg-24 mmHg] 24 mmHg CO:  [3.7 L/min-4.8 L/min] 4.8 L/min  Vent Mode: CPAP;PCV FiO2 (%):  [40 %-50 %] 40 % Set Rate:  [4 bmp-18 bmp] 4 bmp Vt Set:  [540 mL] 540 mL PEEP:  [5 cmH20-8 cmH20] 5 cmH20 Pressure Support:  [10 cmH20] 10 cmH20 Plateau Pressure:  [16 cmH20] 16 cmH20   Intake/Output Summary (Last 24 hours) at 03/11/2022 0831 Last data filed at 03/11/2022 0700 Gross per 24 hour  Intake 7122.09 ml  Output 5206 ml  Net 1916.09 ml    Filed Weights   03/10/22 0601 03/10/22 0619 03/11/22 0500  Weight: 87.1 kg 87.1 kg 92.2 kg    Examination: General: average  build.  HENT: intact Lungs: clear Cardiovascular: midline incision, normal HS with no rub or murmur.  Acceptable chest tube output with no air leak.  Extremities warm Abdomen: soft. Extremities: no edema Neuro: sedated  GU: clear urine  Ancillary tests personally reviewed:  HB: 7.3   Assessment & Plan:  Severe AI now status post AVR Post operative mechanical ventilation Distributive and hypovolemic shock Small pneumothorax, left - S/P chest tube.   Plan:  - Multimodal pain control - IS and mobilization - Diurese today - Start metoprolol for Afib prevention.  - chest tube to suction today. - IV iron supplementation as significant blood loss.  - Should be able to transfer in am.   Best Practice (right click and "Reselect all SmartList Selections" daily)   Diet/type: Advance diet DVT prophylaxis: SCD - start chemical prophylaxis tomorrow.  GI prophylaxis: H2B Lines: Lines out today Foley:  Foley out today.  Code Status:  full code Last date of multidisciplinary goals of care discussion [per TCTS]   Kipp Brood, MD [facility] ICU Physician [place]  Pager: [phone] Mobile: [phone] After hours: [phone].

## 2022-03-11 NOTE — Progress Notes (Signed)
      [address]       [place] [address]             [phone]                 1 Day Post-Op Procedure(s) (LRB): AORTIC VALVE REPLACEMENT WITH 25MM EDWARDS INSPIRIS VALVE (N/A) TRANSESOPHAGEAL ECHOCARDIOGRAM (N/A)   Events: Coagulopathic overnight. Bleeding improved after product given _______________________________________________________________ Vitals: BP (!) 138/90   Pulse 89   Temp (!) 100.6 F (38.1 C)   Resp 18   Ht '5\' 8"'$  (1.727 m)   Wt 92.2 kg   SpO2 97%   BMI 30.91 kg/m  Filed Weights   03/10/22 0601 03/10/22 0619 03/11/22 0500  Weight: 87.1 kg 87.1 kg 92.2 kg     - Neuro: alert NAd  - Cardiovascular: sinus  Drips: none.   CVP:  [0 mmHg-24 mmHg] 24 mmHg CO:  [3.7 L/min-4.8 L/min] 4.8 L/min  - Pulm: EWOB   ABG    Component Value Date/Time   PHART 7.380 03/11/2022 0107   PCO2ART 43.0 03/11/2022 0107   PO2ART 120 (H) 03/11/2022 0107   HCO3 25.1 03/11/2022 0107   TCO2 26 03/11/2022 0107   ACIDBASEDEF 1.0 03/10/2022 2351   O2SAT 98 03/11/2022 0107    - Abd: ND - Extremity: warm  .Intake/Output      03/04 0701 03/05 0700 03/05 0701 03/06 0700   P.O. 502    I.V. (mL/kg) 3316.4 (36)    Blood 1308.3    NG/GT 45    IV Piggyback 1950.4    Total Intake(mL/kg) 7122.1 (77.2)    Urine (mL/kg/hr) 2631 (1.2)    Emesis/NG output 0    Blood 800    Chest Tube 1775    Total Output 5206    Net +1916.1            _______________________________________________________________ Labs:    Latest Ref Rng & Units 03/11/2022    4:36 AM 03/11/2022    1:07 AM 03/10/2022   11:51 PM  CBC  WBC 4.0 - 10.5 K/uL 10.7     Hemoglobin 13.0 - 17.0 g/dL 7.3  6.8  6.8   Hematocrit 39.0 - 52.0 % 21.6  20.0  20.0   Platelets 150 - 400 K/uL 97         Latest Ref Rng & Units 03/11/2022    4:36 AM 03/11/2022    1:07 AM 03/10/2022   11:51 PM  CMP  Glucose 70 - 99 mg/dL 134     BUN 8 - 23 mg/dL 12     Creatinine 0.61 - 1.24 mg/dL 0.86     Sodium  135 - 145 mmol/L 136  139  139   Potassium 3.5 - 5.1 mmol/L 3.8  3.9  3.8   Chloride 98 - 111 mmol/L 104     CO2 22 - 32 mmol/L 25     Calcium 8.9 - 10.3 mg/dL 7.7       CXR: PV congestion  _______________________________________________________________  Assessment and Plan: POD 1 s/p bAVR  Neuro: pain controlled CV: on A/S/BB.  HTN this am, will titrate meds.  Will remove A-line Pulm: Will keep CT Renal: creat stable.  Diuresis today GI: on diet Heme: fibrinogen and INR still off.  Will hold lovenox today.  Hgb 7.3.  will watch ID: afebrile Endo: SSI Dispo: continue ICU care   Matthew [name] 03/11/2022 7:22 AM

## 2022-03-11 NOTE — Progress Notes (Signed)
      [address]       [place] [address]             [phone]      POD # 1 AVR  Resting comfortably  BP (!) 150/95   Pulse 94   Temp 98.5 F (36.9 C) (Oral)   Resp (!) 25   Ht '5\' 8"'$  (1.727 m)   Wt 92.2 kg   SpO2 94%   BMI 30.91 kg/m   Intake/Output Summary (Last 24 hours) at 03/11/2022 1746 Last data filed at 03/11/2022 1700 Gross per 24 hour  Intake 3108.76 ml  Output 3371 ml  Net -262.24 ml   CBG well controlled PM labs pending  Remo Lipps C. Roxan Hockey, MD [facility] [phone]

## 2022-03-11 NOTE — Procedures (Signed)
Extubation Procedure Note  Patient Details:   Name: Matthew [name] DOB: 06/10/1950 MRN: [id number]   Airway Documentation:    Vent end date: 03/11/22 Vent end time: 0000   Evaluation  O2 sats: stable throughout Complications: No apparent complications Patient did tolerate procedure well. Bilateral Breath Sounds: Clear   Yes  NIF -40 VC: 1.5L IS 1148m. Positive cuff leak, no stridor noted. Pt able to say his name. Pt extubated to 4L [place] per protocol. Pt tolerated well   BClance Boll3/05/2022, 12:00 AM

## 2022-03-12 ENCOUNTER — Inpatient Hospital Stay (HOSPITAL_COMMUNITY): Payer: Medicare HMO

## 2022-03-12 DIAGNOSIS — R0600 Dyspnea, unspecified: Secondary | ICD-10-CM | POA: Diagnosis not present

## 2022-03-12 LAB — BASIC METABOLIC PANEL
Anion gap: 5 (ref 5–15)
BUN: 10 mg/dL (ref 8–23)
CO2: 30 mmol/L (ref 22–32)
Calcium: 8.2 mg/dL — ABNORMAL LOW (ref 8.9–10.3)
Chloride: 96 mmol/L — ABNORMAL LOW (ref 98–111)
Creatinine, Ser: 0.87 mg/dL (ref 0.61–1.24)
GFR, Estimated: >60 mL/min (ref >60)
Glucose, Bld: 135 mg/dL — ABNORMAL HIGH (ref 70–99)
Potassium: 3.9 mmol/L (ref 3.5–5.1)
Sodium: 131 mmol/L — ABNORMAL LOW (ref 135–145)

## 2022-03-12 LAB — GLUCOSE, CAPILLARY
Glucose-Capillary: 102 mg/dL — ABNORMAL HIGH (ref 70–99)
Glucose-Capillary: 123 mg/dL — ABNORMAL HIGH (ref 70–99)
Glucose-Capillary: 127 mg/dL — ABNORMAL HIGH (ref 70–99)
Glucose-Capillary: 129 mg/dL — ABNORMAL HIGH (ref 70–99)
Glucose-Capillary: 139 mg/dL — ABNORMAL HIGH (ref 70–99)
Glucose-Capillary: 76 mg/dL (ref 70–99)

## 2022-03-12 LAB — CBC
HCT: 22.4 % — ABNORMAL LOW (ref 39.0–52.0)
Hemoglobin: 7.4 g/dL — ABNORMAL LOW (ref 13.0–17.0)
MCH: 30.7 pg (ref 26.0–34.0)
MCHC: 33 g/dL (ref 30.0–36.0)
MCV: 92.9 fL (ref 80.0–100.0)
Platelets: 97 10*3/uL — ABNORMAL LOW (ref 150–400)
RBC: 2.41 MIL/uL — ABNORMAL LOW (ref 4.22–5.81)
RDW: 13.1 % (ref 11.5–15.5)
WBC: 17.9 10*3/uL — ABNORMAL HIGH (ref 4.0–10.5)
nRBC: 0 % (ref 0.0–0.2)

## 2022-03-12 LAB — PREPARE RBC (CROSSMATCH)

## 2022-03-12 LAB — HEMOGLOBIN AND HEMATOCRIT, BLOOD
HCT: 24.8 % — ABNORMAL LOW (ref 39.0–52.0)
Hemoglobin: 8.5 g/dL — ABNORMAL LOW (ref 13.0–17.0)

## 2022-03-12 MED ORDER — ORAL CARE MOUTH RINSE: 15.0000 mL | OROMUCOSAL | Status: DC | PRN

## 2022-03-12 MED ORDER — METOPROLOL TARTRATE 25 MG PO TABS
25.0000 mg | ORAL_TABLET | Freq: Two times a day (BID) | ORAL | Status: DC
  Administered 2022-03-12 – 2022-03-13 (×3): 25 mg via ORAL
  Filled 2022-03-12 (×3): qty 1

## 2022-03-12 MED ORDER — POTASSIUM CHLORIDE 20 MEQ PO PACK: 40.0000 meq | PACK | Freq: Every day | ORAL | Status: DC

## 2022-03-12 MED ORDER — COLCHICINE 0.6 MG PO TABS
0.6000 mg | ORAL_TABLET | Freq: Two times a day (BID) | ORAL | Status: DC
  Administered 2022-03-12 – 2022-03-16 (×9): 0.6 mg via ORAL
  Filled 2022-03-12 (×9): qty 1

## 2022-03-12 MED ORDER — POTASSIUM CHLORIDE CRYS ER 20 MEQ PO TBCR
40.0000 meq | EXTENDED_RELEASE_TABLET | Freq: Every day | ORAL | Status: DC
  Administered 2022-03-12 – 2022-03-13 (×2): 40 meq via ORAL
  Filled 2022-03-12 (×2): qty 2

## 2022-03-12 MED ORDER — SODIUM CHLORIDE 0.9% IV SOLUTION: Freq: Once | INTRAVENOUS | Status: DC

## 2022-03-12 MED ORDER — FUROSEMIDE 40 MG PO TABS
40.0000 mg | ORAL_TABLET | Freq: Every day | ORAL | Status: DC
  Administered 2022-03-12 – 2022-03-16 (×5): 40 mg via ORAL
  Filled 2022-03-12 (×5): qty 1

## 2022-03-12 NOTE — Progress Notes (Signed)
   NAME:  Matthew [name], MRN:  [id number], DOB:  01-09-1950, LOS: 2 ADMISSION DATE:  03/10/2022, CONSULTATION DATE:  03/10/2022 REFERRING MD:  Kipp Brood, CHIEF COMPLAINT:  post AVR   History of Present Illness:  72 year old man who underwent BPAVR for severe AI. He presented with SOBOE (NYHA class 2).  Otherwise healthy.   Normal RV/LV post procedure. High SVR and low SV - given fluid. Intermittently needed Cardene for hypertension.  Dobutamine started for inotropic support.   Pertinent  Medical History   Past Medical History:  Diagnosis Date   Asthma    as child   Cancer [facility])    prostate    Cataract    Dyspnea    exertional dyspnea   ED (erectile dysfunction)    Fracture    rt ankle   Headache    Hx Migraines   Heart murmur    Hemorrhoids    History of colonic polyps    History of prostate cancer    see's Dr. Janice Norrie   Pneumonia    50+ years ago   Significant Hospital Events: Including procedures, antibiotic start and stop dates in addition to other pertinent events   3/4 - AVR   Interim History / Subjective:  Transfused for low HB. Still having significant chest tube drainage.   Objective   Blood pressure 102/73, pulse 81, temperature 98 F (36.7 C), temperature source Oral, resp. rate 18, height '5\' 8"'$  (1.727 m), weight 92.6 kg, SpO2 99 %.        Intake/Output Summary (Last 24 hours) at 03/12/2022 0816 Last data filed at 03/12/2022 0700 Gross per 24 hour  Intake 1179.5 ml  Output 2665 ml  Net -1485.5 ml    Filed Weights   03/10/22 0619 03/11/22 0500 03/12/22 0500  Weight: 87.1 kg 92.2 kg 92.6 kg    Examination: General: average build.  HENT: intact Lungs: clear Cardiovascular: midline incision, normal HS with + pericardial rub.  Continued chest tube output with no air leak. Output more serous.  Extremities warm Abdomen: soft. Extremities: no edema Neuro: sedated  GU: clear urine  Ancillary tests personally reviewed:  HB: 7.4 WBC: 17.9 Na :  131 Creatinine 0.87 Small left apical pneumothorax on CXR  Assessment & Plan:  Severe AI now status post AVR Continued chest tube drainage.  Acute blood loss anemia.  Thrombocytopenia Small pneumothorax, left - S/P chest tube.   Plan:  - Multimodal pain control - IS and mobilization, continue to wean O2.  - Continue metoprolol for Afib prevention.  No room to increase further.  - chest tube to suction today. - No diuresis today.  - Start colchicine for possible inflammatory component to effusion.  - IV iron supplementation given, transfuse today. - Monitor PLT, holding LMWH until chest tube drainage improves.   Best Practice (right click and "Reselect all SmartList Selections" daily)   Diet/type: Advance diet DVT prophylaxis: SCD - start chemical prophylaxis tomorrow.  GI prophylaxis: H2B Lines: Lines out today Foley:  Foley out today.  Code Status:  full code Last date of multidisciplinary goals of care discussion [per TCTS]   Kipp Brood, MD [facility] ICU Physician [place]  Pager: [phone] Mobile: [phone] After hours: [phone].

## 2022-03-12 NOTE — Progress Notes (Signed)
     [address]       [place] [address]             [phone]       EVENING ROUNDS  POD #2 AVR Feels well in chair Hemodynamics ok

## 2022-03-12 NOTE — Progress Notes (Signed)
      [address]       [place] [address]             [phone]                 2 Days Post-Op Procedure(s) (LRB): AORTIC VALVE REPLACEMENT WITH 25MM EDWARDS INSPIRIS VALVE (N/A) TRANSESOPHAGEAL ECHOCARDIOGRAM (N/A)   Events: Orthostatic with ambulation this am _______________________________________________________________ Vitals: BP 102/73   Pulse 81   Temp 98 F (36.7 C) (Oral)   Resp 18   Ht '5\' 8"'$  (1.727 m)   Wt 92.6 kg   SpO2 99%   BMI 31.04 kg/m  Filed Weights   03/10/22 0619 03/11/22 0500 03/12/22 0500  Weight: 87.1 kg 92.2 kg 92.6 kg     - Neuro: alert NAD  - Cardiovascular: sinus  Drips: none.      - Pulm: EWOB   ABG    Component Value Date/Time   PHART 7.380 03/11/2022 0107   PCO2ART 43.0 03/11/2022 0107   PO2ART 120 (H) 03/11/2022 0107   HCO3 25.1 03/11/2022 0107   TCO2 26 03/11/2022 0107   ACIDBASEDEF 1.0 03/10/2022 2351   O2SAT 98 03/11/2022 0107    - Abd: ND - Extremity: warm  .Intake/Output      03/05 0701 03/06 0700 03/06 0701 03/07 0700   P.O. 466    I.V. (mL/kg) 552.3 (6)    Blood     NG/GT     IV Piggyback 300    Total Intake(mL/kg) 1318.3 (14.2)    Urine (mL/kg/hr) 2275 (1)    Emesis/NG output     Blood     Chest Tube 390    Total Output 2665    Net -1346.7            _______________________________________________________________ Labs:    Latest Ref Rng & Units 03/12/2022    4:20 AM 03/11/2022    4:38 PM 03/11/2022    4:36 AM  CBC  WBC 4.0 - 10.5 K/uL 17.9  14.1  10.7   Hemoglobin 13.0 - 17.0 g/dL 7.4  7.9  7.3   Hematocrit 39.0 - 52.0 % 22.4  22.9  21.6   Platelets 150 - 400 K/uL 97  103  97       Latest Ref Rng & Units 03/12/2022    4:20 AM 03/11/2022    4:38 PM 03/11/2022    4:36 AM  CMP  Glucose 70 - 99 mg/dL 135  170  134   BUN 8 - 23 mg/dL '10  11  12   '$ Creatinine 0.61 - 1.24 mg/dL 0.87  0.94  0.86   Sodium 135 - 145 mmol/L 131  132  136   Potassium 3.5 - 5.1 mmol/L 3.9  3.4  3.8    Chloride 98 - 111 mmol/L 96  96  104   CO2 22 - 32 mmol/L '30  27  25   '$ Calcium 8.9 - 10.3 mg/dL 8.2  8.2  7.7     CXR: PV congestion  _______________________________________________________________  Assessment and Plan: POD 2 s/p bAVR  Neuro: pain controlled CV: on A/S/BB.  Will titrate meds.  Will remove pacing wires.   Pulm: Will keep CT Renal: creat stable.  Diuresis today GI: on diet Heme: will transfuse blood today ID: afebrile Endo: SSI Dispo: continue ICU care   Lajuana Matte 03/12/2022 7:55 AM

## 2022-03-13 DIAGNOSIS — R0600 Dyspnea, unspecified: Secondary | ICD-10-CM | POA: Diagnosis not present

## 2022-03-13 LAB — BASIC METABOLIC PANEL
Anion gap: 10 (ref 5–15)
BUN: 17 mg/dL (ref 8–23)
CO2: 26 mmol/L (ref 22–32)
Calcium: 8.3 mg/dL — ABNORMAL LOW (ref 8.9–10.3)
Chloride: 95 mmol/L — ABNORMAL LOW (ref 98–111)
Creatinine, Ser: 0.95 mg/dL (ref 0.61–1.24)
GFR, Estimated: >60 mL/min (ref >60)
Glucose, Bld: 112 mg/dL — ABNORMAL HIGH (ref 70–99)
Potassium: 4.8 mmol/L (ref 3.5–5.1)
Sodium: 131 mmol/L — ABNORMAL LOW (ref 135–145)

## 2022-03-13 LAB — GLUCOSE, CAPILLARY
Glucose-Capillary: 100 mg/dL — ABNORMAL HIGH (ref 70–99)
Glucose-Capillary: 190 mg/dL — ABNORMAL HIGH (ref 70–99)
Glucose-Capillary: 73 mg/dL (ref 70–99)
Glucose-Capillary: 90 mg/dL (ref 70–99)
Glucose-Capillary: 115 mg/dL — ABNORMAL HIGH (ref 70–99)

## 2022-03-13 LAB — BPAM RBC
Blood Product Expiration Date: 202404022359
ISSUE DATE / TIME: 202403060915
Unit Type and Rh: 5100

## 2022-03-13 LAB — TYPE AND SCREEN
ABO/RH(D): O POS
Antibody Screen: NEGATIVE
Unit division: 0

## 2022-03-13 LAB — CBC
HCT: 23.1 % — ABNORMAL LOW (ref 39.0–52.0)
Hemoglobin: 7.5 g/dL — ABNORMAL LOW (ref 13.0–17.0)
MCH: 30.4 pg (ref 26.0–34.0)
MCHC: 32.5 g/dL (ref 30.0–36.0)
MCV: 93.5 fL (ref 80.0–100.0)
Platelets: 103 10*3/uL — ABNORMAL LOW (ref 150–400)
RBC: 2.47 MIL/uL — ABNORMAL LOW (ref 4.22–5.81)
RDW: 13.1 % (ref 11.5–15.5)
WBC: 16.4 10*3/uL — ABNORMAL HIGH (ref 4.0–10.5)
nRBC: 0 % (ref 0.0–0.2)

## 2022-03-13 MED ORDER — METOPROLOL TARTRATE 12.5 MG HALF TABLET
12.5000 mg | ORAL_TABLET | Freq: Two times a day (BID) | ORAL | Status: DC
  Administered 2022-03-13 – 2022-03-14 (×3): 12.5 mg via ORAL
  Filled 2022-03-13 (×3): qty 1

## 2022-03-13 MED ORDER — SODIUM CHLORIDE 0.9% FLUSH
3.0000 mL | Freq: Two times a day (BID) | INTRAVENOUS | Status: DC
  Administered 2022-03-13 – 2022-03-14 (×3): 3 mL via INTRAVENOUS

## 2022-03-13 MED ORDER — BETHANECHOL CHLORIDE 10 MG PO TABS
5.0000 mg | ORAL_TABLET | Freq: Three times a day (TID) | ORAL | Status: AC
  Administered 2022-03-13 – 2022-03-15 (×9): 5 mg via ORAL
  Filled 2022-03-13 (×9): qty 1

## 2022-03-13 MED ORDER — ENOXAPARIN SODIUM 40 MG/0.4ML IJ SOSY
40.0000 mg | PREFILLED_SYRINGE | INTRAMUSCULAR | Status: DC
  Administered 2022-03-13 – 2022-03-16 (×4): 40 mg via SUBCUTANEOUS
  Filled 2022-03-13 (×4): qty 0.4

## 2022-03-13 MED ORDER — INSULIN ASPART 100 UNIT/ML IJ SOLN
0.0000 [IU] | Freq: Three times a day (TID) | INTRAMUSCULAR | Status: DC
  Administered 2022-03-14: 2 [IU] via SUBCUTANEOUS

## 2022-03-13 MED ORDER — SODIUM CHLORIDE 0.9 % IV SOLN: 250.0000 mL | INTRAVENOUS | Status: DC | PRN

## 2022-03-13 MED ORDER — SODIUM CHLORIDE 0.9% FLUSH: 3.0000 mL | INTRAVENOUS | Status: DC | PRN

## 2022-03-13 MED ORDER — POTASSIUM CHLORIDE CRYS ER 20 MEQ PO TBCR
20.0000 meq | EXTENDED_RELEASE_TABLET | Freq: Every day | ORAL | Status: DC
  Administered 2022-03-14 – 2022-03-16 (×3): 20 meq via ORAL
  Filled 2022-03-13 (×3): qty 1

## 2022-03-13 MED ORDER — ORAL CARE MOUTH RINSE: 15.0000 mL | OROMUCOSAL | Status: DC | PRN

## 2022-03-13 MED ORDER — ~~LOC~~ CARDIAC SURGERY, PATIENT & FAMILY EDUCATION: Freq: Once | Status: AC

## 2022-03-13 NOTE — Progress Notes (Signed)
   NAME:  Matthew [name], MRN:  [id number], DOB:  11/29/1950, LOS: 3 ADMISSION DATE:  03/10/2022, CONSULTATION DATE:  03/10/2022 REFERRING MD:  Kipp Brood, CHIEF COMPLAINT:  post AVR   History of Present Illness:  72 year old man who underwent BPAVR for severe AI. He presented with SOBOE (NYHA class 2).  Otherwise healthy.   Normal RV/LV post procedure. High SVR and low SV - given fluid. Intermittently needed Cardene for hypertension.  Dobutamine started for inotropic support.   Pertinent  Medical History   Past Medical History:  Diagnosis Date   Asthma    as child   Cancer [facility])    prostate    Cataract    Dyspnea    exertional dyspnea   ED (erectile dysfunction)    Fracture    rt ankle   Headache    Hx Migraines   Heart murmur    Hemorrhoids    History of colonic polyps    History of prostate cancer    see's Dr. Janice Norrie   Pneumonia    50+ years ago   Significant Hospital Events: Including procedures, antibiotic start and stop dates in addition to other pertinent events   3/4 - AVR   Interim History / Subjective:  Transfused for low HB. Chest tube drainage has decreased significantly .   Objective   Blood pressure 107/77, pulse 82, temperature 98.3 F (36.8 C), temperature source Oral, resp. rate 19, height '5\' 8"'$  (1.727 m), weight 92.4 kg, SpO2 94 %.        Intake/Output Summary (Last 24 hours) at 03/13/2022 0916 Last data filed at 03/13/2022 0910 Gross per 24 hour  Intake 654.81 ml  Output 2705 ml  Net -2050.19 ml    Filed Weights   03/11/22 0500 03/12/22 0500 03/13/22 0500  Weight: 92.2 kg 92.6 kg 92.4 kg    Examination: General: average build.  HENT: intact Lungs: clear Cardiovascular: midline incision, normal HS with + pericardial rub.  Serous chest tube output with no air leak.   Extremities warm Abdomen: soft. Extremities: no edema Neuro: awake and alert GU: clear urine  Ancillary tests personally reviewed:  HB: 7.5 WBC: 16.4 Na : 131 Creatinine  0.87 Small left apical pneumothorax on CXR  Assessment & Plan:  Severe AI now status post AVR Improving chest tube drainage.  Acute blood loss anemia.  Thrombocytopenia - improving Small pneumothorax, left - S/P chest tube.   Plan:  - Multimodal pain control - IS and mobilization, continue to wean O2.  - Continue metoprolol for Afib prevention.  No room to increase further.  - chest tube removal today.  - Diuresis. - Continue colchicine for possible inflammatory component to effusion.  - IV iron supplementation given, Little improvement with transfusion.  - Start LMWH tomorrow.   Best Practice (right click and "Reselect all SmartList Selections" daily)   Diet/type: Advance diet DVT prophylaxis: SCD - start chemical prophylaxis tomorrow.  GI prophylaxis: H2B Lines: Lines out today Foley:  Foley - started urecholine prior to removal tomorrow.  Code Status:  full code Last date of multidisciplinary goals of care discussion [per TCTS]   Kipp Brood, MD [facility] ICU Physician [place]  Pager: [phone] Mobile: [phone] After hours: [phone].

## 2022-03-13 NOTE — Progress Notes (Signed)
Pt and wife received OHS book and education on restrictions, heart healthy diet, ex guidelines, Move in the Tube sheet, incentive spirometer use when d/c and CRPII. Pt denies questions and was encouraged to look in the book for additional information. Referral placed to [facility].   Christen Bame 03/13/2022 2:23 PM'  [id number]

## 2022-03-13 NOTE — Progress Notes (Signed)
TCTS Progress Note: 3 Days Post-Op Procedure(s) (LRB): AORTIC VALVE REPLACEMENT WITH 25MM EDWARDS INSPIRIS VALVE (N/A) TRANSESOPHAGEAL ECHOCARDIOGRAM (N/A)  LOS: 3 days   Wires/tubes out and transfer orders are in.       Latest Ref Rng & Units 03/13/2022    5:19 AM 03/12/2022    4:19 PM 03/12/2022    4:20 AM  CBC  WBC 4.0 - 10.5 K/uL 16.4   17.9   Hemoglobin 13.0 - 17.0 g/dL 7.5  8.5  7.4   Hematocrit 39.0 - 52.0 % 23.1  24.8  22.4   Platelets 150 - 400 K/uL 103   97        Latest Ref Rng & Units 03/13/2022    5:19 AM 03/12/2022    4:20 AM 03/11/2022    4:38 PM  CMP  Glucose 70 - 99 mg/dL 112  135  170   BUN 8 - 23 mg/dL '17  10  11   '$ Creatinine 0.61 - 1.24 mg/dL 0.95  0.87  0.94   Sodium 135 - 145 mmol/L 131  131  132   Potassium 3.5 - 5.1 mmol/L 4.8  3.9  3.4   Chloride 98 - 111 mmol/L 95  96  96   CO2 22 - 32 mmol/L '26  30  27   '$ Calcium 8.9 - 10.3 mg/dL 8.3  8.2  8.2     ABG    Component Value Date/Time   PHART 7.380 03/11/2022 0107   PCO2ART 43.0 03/11/2022 0107   PO2ART 120 (H) 03/11/2022 0107   HCO3 25.1 03/11/2022 0107   TCO2 26 03/11/2022 0107   ACIDBASEDEF 1.0 03/10/2022 2351   O2SAT 98 03/11/2022 0107

## 2022-03-13 NOTE — Progress Notes (Signed)
      [address]       [place] [address]             [phone]                 3 Days Post-Op Procedure(s) (LRB): AORTIC VALVE REPLACEMENT WITH 25MM EDWARDS INSPIRIS VALVE (N/A) TRANSESOPHAGEAL ECHOCARDIOGRAM (N/A)   Events: Doing better today Urinary retention _______________________________________________________________ Vitals: BP 107/77   Pulse 82   Temp 98.3 F (36.8 C) (Oral)   Resp 19   Ht '5\' 8"'$  (1.727 m)   Wt 92.4 kg   SpO2 94%   BMI 30.97 kg/m  Filed Weights   03/11/22 0500 03/12/22 0500 03/13/22 0500  Weight: 92.2 kg 92.6 kg 92.4 kg     - Neuro: alert NAD  - Cardiovascular: sinus  Drips: none.      - Pulm: EWOB   ABG    Component Value Date/Time   PHART 7.380 03/11/2022 0107   PCO2ART 43.0 03/11/2022 0107   PO2ART 120 (H) 03/11/2022 0107   HCO3 25.1 03/11/2022 0107   TCO2 26 03/11/2022 0107   ACIDBASEDEF 1.0 03/10/2022 2351   O2SAT 98 03/11/2022 0107    - Abd: ND - Extremity: warm  .Intake/Output      03/06 0701 03/07 0700 03/07 0701 03/08 0700   P.O. 20 240   I.V. (mL/kg) 89.8 (1)    Blood 315    IV Piggyback     Total Intake(mL/kg) 424.8 (4.6) 240 (2.6)   Urine (mL/kg/hr) 2055 (0.9)    Chest Tube 540    Total Output 2595    Net -2170.2 +240        Urine Occurrence 1 x       _______________________________________________________________ Labs:    Latest Ref Rng & Units 03/13/2022    5:19 AM 03/12/2022    4:19 PM 03/12/2022    4:20 AM  CBC  WBC 4.0 - 10.5 K/uL 16.4   17.9   Hemoglobin 13.0 - 17.0 g/dL 7.5  8.5  7.4   Hematocrit 39.0 - 52.0 % 23.1  24.8  22.4   Platelets 150 - 400 K/uL 103   97       Latest Ref Rng & Units 03/13/2022    5:19 AM 03/12/2022    4:20 AM 03/11/2022    4:38 PM  CMP  Glucose 70 - 99 mg/dL 112  135  170   BUN 8 - 23 mg/dL '17  10  11   '$ Creatinine 0.61 - 1.24 mg/dL 0.95  0.87  0.94   Sodium 135 - 145 mmol/L 131  131  132   Potassium 3.5 - 5.1 mmol/L 4.8  3.9  3.4   Chloride  98 - 111 mmol/L 95  96  96   CO2 22 - 32 mmol/L '26  30  27   '$ Calcium 8.9 - 10.3 mg/dL 8.3  8.2  8.2     CXR: PV congestion  _______________________________________________________________  Assessment and Plan: POD 3 s/p bAVR  Neuro: pain controlled CV: on A/S/BB.  Will titrate meds.   Pulm: Will remove CT Renal: creat stable.  Diuresis today GI: on diet Heme: stable ID: afebrile Endo: SSI Dispo: floor today   Miking Usrey O Valery Amedee 03/13/2022 9:08 AM

## 2022-03-14 LAB — BASIC METABOLIC PANEL
Anion gap: 7 (ref 5–15)
BUN: 18 mg/dL (ref 8–23)
CO2: 29 mmol/L (ref 22–32)
Calcium: 8.2 mg/dL — ABNORMAL LOW (ref 8.9–10.3)
Chloride: 95 mmol/L — ABNORMAL LOW (ref 98–111)
Creatinine, Ser: 0.84 mg/dL (ref 0.61–1.24)
GFR, Estimated: >60 mL/min (ref >60)
Glucose, Bld: 117 mg/dL — ABNORMAL HIGH (ref 70–99)
Potassium: 4.2 mmol/L (ref 3.5–5.1)
Sodium: 131 mmol/L — ABNORMAL LOW (ref 135–145)

## 2022-03-14 LAB — GLUCOSE, CAPILLARY
Glucose-Capillary: 97 mg/dL (ref 70–99)
Glucose-Capillary: 98 mg/dL (ref 70–99)
Glucose-Capillary: 103 mg/dL — ABNORMAL HIGH (ref 70–99)
Glucose-Capillary: 134 mg/dL — ABNORMAL HIGH (ref 70–99)

## 2022-03-14 LAB — CBC
HCT: 22.9 % — ABNORMAL LOW (ref 39.0–52.0)
Hemoglobin: 7.9 g/dL — ABNORMAL LOW (ref 13.0–17.0)
MCH: 31.2 pg (ref 26.0–34.0)
MCHC: 34.5 g/dL (ref 30.0–36.0)
MCV: 90.5 fL (ref 80.0–100.0)
Platelets: 168 10*3/uL (ref 150–400)
RBC: 2.53 MIL/uL — ABNORMAL LOW (ref 4.22–5.81)
RDW: 13.2 % (ref 11.5–15.5)
WBC: 11.9 10*3/uL — ABNORMAL HIGH (ref 4.0–10.5)
nRBC: 0 % (ref 0.0–0.2)

## 2022-03-14 MED ORDER — FUROSEMIDE 10 MG/ML IJ SOLN
40.0000 mg | Freq: Once | INTRAMUSCULAR | Status: AC
  Administered 2022-03-14: 40 mg via INTRAVENOUS
  Filled 2022-03-14: qty 4

## 2022-03-14 MED FILL — Calcium Chloride Inj 10%: INTRAVENOUS | Qty: 10 | Status: AC

## 2022-03-14 MED FILL — Heparin Sodium (Porcine) Inj 1000 Unit/ML: INTRAMUSCULAR | Qty: 10 | Status: AC

## 2022-03-14 MED FILL — Mannitol IV Soln 20%: INTRAVENOUS | Qty: 500 | Status: AC

## 2022-03-14 MED FILL — Electrolyte-R (PH 7.4) Solution: INTRAVENOUS | Qty: 3000 | Status: AC

## 2022-03-14 MED FILL — Sodium Bicarbonate IV Soln 8.4%: INTRAVENOUS | Qty: 50 | Status: AC

## 2022-03-14 MED FILL — Sodium Chloride IV Soln 0.9%: INTRAVENOUS | Qty: 2000 | Status: AC

## 2022-03-14 NOTE — Progress Notes (Addendum)
CARDIAC REHAB PHASE I   PRE:  Rate/Rhythm: 93 RA  BP:  Sitting: 125/88      SaO2: 95 RA  MODE:  Ambulation: 470 ft   AD:  RW  POST:  Rate/Rhythm: 108 ST  BP:  Sitting: 115/75      SaO2: 95 RA  Pt amb with standby assistance, pt denies CP and SOB during amb and was returned to room w/o complaint.   Pt was walked with 1L of O2, pt walked very well. Consider short distance walking w/o AD   Pt and wife inquired about shower chair for home   Matthew [name]  1:45 PM 03/14/2022    Service time is from 1320 to 1349.

## 2022-03-14 NOTE — Progress Notes (Signed)
      [address]       [place] [address]             [phone]                 4 Days Post-Op Procedure(s) (LRB): AORTIC VALVE REPLACEMENT WITH 25MM EDWARDS INSPIRIS VALVE (N/A) TRANSESOPHAGEAL ECHOCARDIOGRAM (N/A)   Events: Doing better today  _______________________________________________________________ Vitals: BP 127/84   Pulse 86   Temp 99 F (37.2 C) (Oral)   Resp 16   Ht 5\' 8"  (1.727 m)   Wt 90.9 kg   SpO2 93%   BMI 30.47 kg/m  Filed Weights   03/12/22 0500 03/13/22 0500 03/14/22 0600  Weight: 92.6 kg 92.4 kg 90.9 kg     - Neuro: alert NAD  - Cardiovascular: sinus  Drips: none.      - Pulm: EWOB   ABG    Component Value Date/Time   PHART 7.380 03/11/2022 0107   PCO2ART 43.0 03/11/2022 0107   PO2ART 120 (H) 03/11/2022 0107   HCO3 25.1 03/11/2022 0107   TCO2 26 03/11/2022 0107   ACIDBASEDEF 1.0 03/10/2022 2351   O2SAT 98 03/11/2022 0107    - Abd: ND - Extremity: warm  .Intake/Output      03/07 0701 03/08 0700 03/08 0701 03/09 0700   [name] 360    I.V. (mL/kg)     Blood     Total Intake(mL/kg) 360 (4)    Urine (mL/kg/hr) 1300 (0.6)    Stool 0    Chest Tube 70    Total Output 1370    Net -1010         Urine Occurrence 1 x    Stool Occurrence 2 x       _______________________________________________________________ Labs:    Latest Ref Rng & Units 03/14/2022    4:13 AM 03/13/2022    5:19 AM 03/12/2022    4:19 PM  CBC  WBC 4.0 - 10.5 K/uL 11.9  16.4    Hemoglobin 13.0 - 17.0 g/dL 7.9  7.5  8.5   Hematocrit 39.0 - 52.0 % 22.9  23.1  24.8   Platelets 150 - 400 K/uL 168  103        Latest Ref Rng & Units 03/14/2022    4:13 AM 03/13/2022    5:19 AM 03/12/2022    4:20 AM  CMP  Glucose 70 - 99 mg/dL 117  112  135   BUN 8 - 23 mg/dL 18  17  10    Creatinine 0.61 - 1.24 mg/dL 0.84  0.95  0.87   Sodium 135 - 145 mmol/L 131  131  131   Potassium 3.5 - 5.1 mmol/L 4.2  4.8  3.9   Chloride 98 - 111 mmol/L 95  95  96    CO2 22 - 32 mmol/L 29  26  30    Calcium 8.9 - 10.3 mg/dL 8.2  8.3  8.2     CXR: PV congestion  _______________________________________________________________  Assessment and Plan: POD 4 s/p bAVR  Neuro: pain controlled CV: on A/S/BB.  Will titrate meds.   Pulm: wean O2. Renal: creat stable.  Diuresis today GI: on diet Heme: stable ID: afebrile Endo: SSI Dispo: floor today.  Awaiting floor bed   Matthew [name] Matthew [name] 03/14/2022 7:03 AM

## 2022-03-14 NOTE — Progress Notes (Signed)
Pt received from [place].  No complaints, alert and appropriate.  IS education reinforced.  Oriented to room and unit, care plan reviewed.  Tele applied, CCMD notified.  Family present.  Will cont plan of care.

## 2022-03-14 NOTE — Progress Notes (Signed)
Pt in bed using breathing device, Sats on RA 90-98 on good Pleth. Pt agreeable to ambulate however there were no O2 carriers available. Opted for sit and stands to assess Sats on RA with activity. Pt stands with min assist, Sats initially maintained but dropped after consecutive sit and stands (unsure of accuracy)   Opted to wait for available carrier for pt safety  Christen Bame 03/14/2022 11:20 AM   [id number]

## 2022-03-15 LAB — GLUCOSE, CAPILLARY: Glucose-Capillary: 113 mg/dL — ABNORMAL HIGH (ref 70–99)

## 2022-03-15 MED ORDER — METOPROLOL TARTRATE 25 MG PO TABS
25.0000 mg | ORAL_TABLET | Freq: Two times a day (BID) | ORAL | Status: DC
  Administered 2022-03-15 – 2022-03-16 (×3): 25 mg via ORAL
  Filled 2022-03-15 (×3): qty 1

## 2022-03-15 NOTE — Progress Notes (Signed)
Mobility Specialist - Progress Note   03/15/22 1200  Mobility  Activity Ambulated with assistance in hallway  Level of Assistance Standby assist, set-up cues, supervision of patient - no hands on  Assistive Device Front wheel walker  Distance Ambulated (ft) 550 ft  Activity Response Tolerated well  $Mobility charge 1 Mobility    Pt received in recliner and agreeable. No complaints on walk, demonstrated compliance w/ sternal precautions throughout session. Left in bed w/ call bell within reach.   [place] Specialist Please contact via SecureChat or Rehab office at [phone]

## 2022-03-15 NOTE — TOC Progression Note (Addendum)
Transition of Care [facility]) - Progression Note    Patient Details  Name: Matthew [name] MRN: [id number] Date of Birth: [birth year]  [facility]) CM/SW Contact  Konrad Penta, RN Phone Number: [phone] 03/15/2022, 3:50 PM  Clinical Narrative:   Made aware by nursing rolling walker is needed. Anticipated dc date is tomorrow 03/16/22. Lives at home with spouse. Attempted to reach spouse to discuss. No answer. Made referral to [facility]. Spoke with [employer]. Rolling walker to be delivered prior to hospital discharge. Nursing made aware.    Expected Discharge Plan: Home with spouse Barriers to Discharge: Continued Medical Work up  Expected Discharge Plan and Services   Discharge Planning Services: CM Consult Post Acute Care Choice: IP Rehab Living arrangements for the past 2 months: Single Family Home                                       Social Determinants of Health (SDOH) Interventions SDOH Screenings   Food Insecurity: No Food Insecurity (03/13/2022)  Housing: Low Risk  (03/13/2022)  Transportation Needs: No Transportation Needs (03/13/2022)  Utilities: Not At Risk (03/13/2022)  Alcohol Screen: Low Risk  (10/14/2021)  Depression (PHQ2-9): Low Risk  (10/15/2021)  Financial Resource Strain: Low Risk  (10/14/2021)  Physical Activity: Sufficiently Active (10/14/2021)  Social Connections: Socially Integrated (10/14/2021)  Stress: No Stress Concern Present (10/14/2021)  Tobacco Use: Low Risk  (03/11/2022)    Readmission Risk Interventions     No data to display

## 2022-03-15 NOTE — Progress Notes (Addendum)
      [address]       [place] [address]             [phone]      5 Days Post-Op Procedure(s) (LRB): AORTIC VALVE REPLACEMENT WITH 25MM EDWARDS INSPIRIS VALVE (N/A) TRANSESOPHAGEAL ECHOCARDIOGRAM (N/A)  Subjective:  Patient just coming back from bathroom.  Overall doing well.  Some soreness.  He has moved his bowels  Objective: Vital signs in last 24 hours: Temp:  [98.5 F (36.9 C)-99 F (37.2 C)] 98.7 F (37.1 C) (03/09 0328) Pulse Rate:  [86-100] 100 (03/09 0328) Cardiac Rhythm: Sinus tachycardia (03/08 2155) Resp:  [16-20] 16 (03/09 0328) BP: (132-146)/(71-94) 141/94 (03/09 0328) SpO2:  [93 %-98 %] 98 % (03/09 0328) Weight:  [89 kg] 89 kg (03/09 0336)  Intake/Output from previous day: 03/08 0701 - 03/09 0700 In: -  Out: 500 [Urine:500]  General appearance: alert, cooperative, and no distress Heart: regular rate and rhythm Lungs: clear to auscultation bilaterally Abdomen: soft, non-tender; bowel sounds normal; no masses,  no organomegaly Extremities: edema trace Wound: clean and dry  Lab Results: Recent Labs    03/13/22 0519 03/14/22 0413  WBC 16.4* 11.9*  HGB 7.5* 7.9*  HCT 23.1* 22.9*  PLT 103* 168   BMET:  Recent Labs    03/13/22 0519 03/14/22 0413  NA 131* 131*  K 4.8 4.2  CL 95* 95*  CO2 26 29  GLUCOSE 112* 117*  BUN 17 18  CREATININE 0.95 0.84  CALCIUM 8.3* 8.2*    PT/INR: No results for input(s): "LABPROT", "INR" in the last 72 hours. ABG    Component Value Date/Time   PHART 7.380 03/11/2022 0107   HCO3 25.1 03/11/2022 0107   TCO2 26 03/11/2022 0107   ACIDBASEDEF 1.0 03/10/2022 2351   O2SAT 98 03/11/2022 0107   CBG (last 3)  Recent Labs    03/14/22 1644 03/14/22 2119 03/15/22 0604  GLUCAP 103* 134* 113*    Assessment/Plan: S/P Procedure(s) (LRB): AORTIC VALVE REPLACEMENT WITH 25MM EDWARDS INSPIRIS VALVE (N/A) TRANSESOPHAGEAL ECHOCARDIOGRAM (N/A)  CV- Sinus Tach, + HTN- increase Lopressor to 25 mg  BID Pulm- no acute issues, off oxygen, continue IS Renal- creatinine, lytes have been okay.Marland Kitchen weight is trending down continue Lasix, potassium CBGs controlled, patient is not a diabetic will d/c SSIP Dispo- patient doing well, titrate BB for additional HR control, if remains clinically stable for discharge home in AM   LOS: 5 days    Ellwood Handler, PA-C 03/15/2022  Patient seen and examined, agree with findings, A/P as outlined above  Remo Lipps C. Roxan Hockey, MD [facility] [phone]

## 2022-03-15 NOTE — Progress Notes (Signed)
CARDIAC REHAB PHASE I   PRE:  Rate/Rhythm: 101 ST   BP:  Supine: 145/95   SaO2: 98 RA  MODE:  Ambulation: 470 ft   POST:  Rate/Rhythm: 123 ST  BP:  Sitting: 156/90   SaO2: 92 RA  Pt amb in hallway with RW and standby assist, tolerated well with no s/sx. SpO2 remained 90-92% during walk. No question regarding education yesterday. Pt left in recliner with call bell in reach [place], MS 03/15/2022 9:25 AM

## 2022-03-16 MED ORDER — ASPIRIN 325 MG PO TBEC: 325.0000 mg | DELAYED_RELEASE_TABLET | Freq: Every day | ORAL | Status: DC

## 2022-03-16 MED ORDER — AMLODIPINE BESYLATE 10 MG PO TABS
10.0000 mg | ORAL_TABLET | Freq: Every day | ORAL | Status: DC
  Administered 2022-03-16: 10 mg via ORAL
  Filled 2022-03-16: qty 1

## 2022-03-16 MED ORDER — FUROSEMIDE 40 MG PO TABS: 40.0000 mg | ORAL_TABLET | Freq: Every day | ORAL | 0 refills | Status: DC

## 2022-03-16 MED ORDER — AMLODIPINE BESYLATE 10 MG PO TABS: 10.0000 mg | ORAL_TABLET | Freq: Every day | ORAL | 3 refills | Status: DC

## 2022-03-16 MED ORDER — POTASSIUM CHLORIDE CRYS ER 20 MEQ PO TBCR: 20.0000 meq | EXTENDED_RELEASE_TABLET | Freq: Every day | ORAL | 0 refills | Status: DC

## 2022-03-16 MED ORDER — OXYCODONE HCL 5 MG PO TABS: 5.0000 mg | ORAL_TABLET | ORAL | 0 refills | Status: DC | PRN

## 2022-03-16 MED ORDER — METOPROLOL TARTRATE 25 MG PO TABS: 25.0000 mg | ORAL_TABLET | Freq: Two times a day (BID) | ORAL | 3 refills | Status: DC

## 2022-03-16 NOTE — Progress Notes (Signed)
Patient alert and oriented, both with wife, verbalized understanding of dc instructions. All belongings given to patient. Ccmd notified of dc order.

## 2022-03-16 NOTE — Progress Notes (Signed)
      [address]       [place] [address]             [phone]        6 Days Post-Op Procedure(s) (LRB): AORTIC VALVE REPLACEMENT WITH 25MM EDWARDS INSPIRIS VALVE (N/A) TRANSESOPHAGEAL ECHOCARDIOGRAM (N/A)  Subjective:  Patient doing well.  States he is feeling pretty good and is ready to go home. + ambulation  + BM  Objective: Vital signs in last 24 hours: Temp:  [98.5 F (36.9 C)-99.6 F (37.6 C)] 98.7 F (37.1 C) (03/10 0857) Pulse Rate:  [86-99] 94 (03/10 0857) Cardiac Rhythm: Normal sinus rhythm (03/09 2100) Resp:  [17-20] 18 (03/10 0857) BP: (133-168)/(91-101) 155/94 (03/10 0857) SpO2:  [93 %-99 %] 99 % (03/10 0857) Weight:  [86.8 kg] 86.8 kg (03/10 0401)  General appearance: alert, cooperative, and no distress Heart: regular rate and rhythm Lungs: clear to auscultation bilaterally Abdomen: soft, non-tender; bowel sounds normal; no masses,  no organomegaly Extremities: extremities normal, atraumatic, no cyanosis or edema Wound: clean and dry  Lab Results: Recent Labs    03/14/22 0413  WBC 11.9*  HGB 7.9*  HCT 22.9*  PLT 168   BMET:  Recent Labs    03/14/22 0413  NA 131*  K 4.2  CL 95*  CO2 29  GLUCOSE 117*  BUN 18  CREATININE 0.84  CALCIUM 8.2*    PT/INR: No results for input(s): "LABPROT", "INR" in the last 72 hours. ABG    Component Value Date/Time   PHART 7.380 03/11/2022 0107   HCO3 25.1 03/11/2022 0107   TCO2 26 03/11/2022 0107   ACIDBASEDEF 1.0 03/10/2022 2351   O2SAT 98 03/11/2022 0107   CBG (last 3)  Recent Labs    03/14/22 1644 03/14/22 2119 03/15/22 0604  GLUCAP 103* 134* 113*    Assessment/Plan: S/P Procedure(s) (LRB): AORTIC VALVE REPLACEMENT WITH 25MM EDWARDS INSPIRIS VALVE (N/A) TRANSESOPHAGEAL ECHOCARDIOGRAM (N/A)  CV- NSR, + HTN- continue Lopressor and will start Norvasc Pulm- no acute issues, off oxygen continue IS Renal- creatinine has been stable, weight is trending down, taper Lasix,  potassium Home DME ordered and in room Dispo- patient stable, will d/c home today   LOS: 6 days    Ellwood Handler, PA-C 03/16/2022

## 2022-03-16 NOTE — Discharge Instructions (Signed)

## 2022-03-17 ENCOUNTER — Telehealth: Payer: Self-pay

## 2022-03-17 LAB — HEMOGLOBIN A1C
Hgb A1c MFr Bld: 5.5 % (ref 4.8–5.6)
Mean Plasma Glucose: 111 mg/dL

## 2022-03-17 MED FILL — Heparin Sodium (Porcine) Inj 1000 Unit/ML: Qty: 1000 | Status: AC

## 2022-03-17 MED FILL — Lidocaine HCl Local Preservative Free (PF) Inj 2%: INTRAMUSCULAR | Qty: 14 | Status: AC

## 2022-03-17 MED FILL — Potassium Chloride Inj 2 mEq/ML: INTRAVENOUS | Qty: 40 | Status: AC

## 2022-03-17 NOTE — Patient Outreach (Signed)
Opened in error; duplicate.

## 2022-03-17 NOTE — Transitions of Care (Post Inpatient/ED Visit) (Signed)
   03/17/2022  Name: MACAULAY REICHER MRN: [id number] DOB: 07/21/1950  Today's TOC FU Call Status: Today's TOC FU Call Status:: Unsuccessul Call (1st Attempt) Unsuccessful Call (1st Attempt) Date: 03/17/22  Attempted to reach the patient regarding the most recent Inpatient/ED visit.  Follow Up Plan: Additional outreach attempts will be made to reach the patient to complete the Transitions of Care (Post Inpatient/ED visit) call.   Johnney Killian, RN, BSN, CCM Care Management Coordinator [place] Phone: [phone]: [phone]

## 2022-03-18 ENCOUNTER — Telehealth: Payer: Self-pay

## 2022-03-18 NOTE — Transitions of Care (Post Inpatient/ED Visit) (Signed)
   03/18/2022  Name: Matthew [name] MRN: [id number] DOB: September 28, 1950  Today's TOC FU Call Status: Today's TOC FU Call Status:: Successful TOC FU Call Competed TOC FU Call Complete Date: 03/18/22  Transition Care Management Follow-up Telephone Call Date of Discharge: 03/16/22 Discharge Facility: [facility]) Type of Discharge: Inpatient Admission Primary Inpatient Discharge Diagnosis:: s/p Aortic Valve Replacement How have you been since you were released from the hospital?: Better Any questions or concerns?: No  Items Reviewed: Did you receive and understand the discharge instructions provided?: Yes Medications obtained and verified?: Yes (Medications Reviewed) Any new allergies since your discharge?: No Dietary orders reviewed?: Yes Type of Diet Ordered:: Heart Healthy, Low sodium Do you have support at home?: Yes People in Home: spouse Name of Support/Comfort Primary Source: wife- [place] [facility] and Equipment/Supplies: [place] Ordered?: No Any new equipment or medical supplies ordered?: Yes (walker) Name of Medical supply agency?: [facility] Were you able to get the equipment/medical supplies?: Yes Do you have any questions related to the use of the equipment/supplies?: No  Functional Questionnaire: Do you need assistance with bathing/showering or dressing?: Yes Do you need assistance with meal preparation?: Yes Do you need assistance with eating?: No Do you have difficulty maintaining continence: No Do you need assistance with getting out of bed/getting out of a chair/moving?: No Do you have difficulty managing or taking your medications?: No  Folllow up appointments reviewed: PCP Follow-up appointment confirmed?: NA Specialist Hospital Follow-up appointment confirmed?: Yes Date of Specialist follow-up appointment?: 03/21/22 Follow-Up Specialty Provider:: Dr. Kipp Brood Do you need transportation to your follow-up appointment?: No Do you understand  care options if your condition(s) worsen?: Yes-patient verbalized understanding  SDOH Interventions Today    Flowsheet Row Most Recent Value  SDOH Interventions   Food Insecurity Interventions Intervention Not Indicated  Housing Interventions Intervention Not Indicated  Transportation Interventions Intervention Not Indicated      Interventions Today    Flowsheet Row Most Recent Value  Chronic Disease   Chronic disease during today's visit Other  [Aortic Valve Insufficiency]  General Interventions   General Interventions Discussed/Reviewed General Interventions Discussed, Doctor Visits  Doctor Visits Discussed/Reviewed Doctor Visits Discussed, Specialist  PCP/Specialist Visits Compliance with follow-up visit  Exercise Interventions   Exercise Discussed/Reviewed Physical Activity  Physical Activity Discussed/Reviewed Physical Activity Discussed  [Patient walking slowly around home with walker]  Nutrition Interventions   Nutrition Discussed/Reviewed Nutrition Discussed  [Poor appetite, patient trying to eat regularly]  Pharmacy Interventions   Pharmacy Dicussed/Reviewed Medication Adherence       Johnney Killian, RN, BSN, CCM Care Management Coordinator [facility] Phone: [phone]: [phone]

## 2022-03-21 ENCOUNTER — Ambulatory Visit (INDEPENDENT_AMBULATORY_CARE_PROVIDER_SITE_OTHER): Payer: Self-pay | Admitting: Thoracic Surgery (Cardiothoracic Vascular Surgery)

## 2022-03-21 DIAGNOSIS — Z952 Presence of prosthetic heart valve: Secondary | ICD-10-CM

## 2022-03-21 NOTE — Progress Notes (Signed)
     [address]       [place] [address]             [phone]       Patient: Home Provider: Office Consent for Telemedicine visit obtained.  Today's visit was completed via a real-time telehealth (see specific modality noted below). The patient/authorized person provided oral consent at the time of the visit to engage in a telemedicine encounter with the present provider at [facility]. The patient/authorized person was informed of the potential benefits, limitations, and risks of telemedicine. The patient/authorized person expressed understanding that the laws that protect confidentiality also apply to telemedicine. The patient/authorized person acknowledged understanding that telemedicine does not provide emergency services and that he or she would need to call 911 or proceed to the nearest hospital for help if such a need arose.   Total time spent in the clinical discussion 10 minutes.  Telehealth Modality: Phone visit (audio only)  I had a telephone visit with  Matthew [name] who is s/p AVR.  Overall doing well.   Pain is minimal.  Ambulating well. Vitals have been stable.  Matthew [name] will see [place] back in 1 month with a chest x-ray for cardiac rehab clearance.  Dawnetta Copenhaver Bary Leriche

## 2022-03-30 NOTE — Progress Notes (Unsigned)
Office Visit    Patient Name: Matthew [name] Date of Encounter: 03/30/2022  Primary Care Provider:  Laurey Morale, MD Primary Cardiologist:  Lauree Chandler, MD Primary Electrophysiologist: None  Chief Complaint    Matthew [name] is a 72 y.o. 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 Past Medical History    Past Medical History:  Diagnosis Date   Asthma    as child   Cancer [facility])    prostate    Cataract    Dyspnea    exertional dyspnea   ED (erectile dysfunction)    Fracture    rt ankle   Headache    Hx Migraines   Heart murmur    Hemorrhoids    History of colonic polyps    History of prostate cancer    see's Dr. Janice Norrie   Pneumonia    50+ years ago   Past Surgical History:  Procedure Laterality Date   ANKLE FRACTURE SURGERY Left 2001   AORTIC VALVE REPLACEMENT N/A 03/10/2022   Procedure: AORTIC VALVE REPLACEMENT WITH 25MM EDWARDS INSPIRIS VALVE;  Surgeon: Lajuana Matte, MD;  Location: [place];  Service: Open Heart Surgery;  Laterality: N/A;   CATARACT EXTRACTION W/ INTRAOCULAR LENS IMPLANT Bilateral    per Dr. Lucita Ferrara    COLONOSCOPY  11/18/2018   per Dr. Hilarie Fredrickson, adenomatous polyps, repeat in 7 yrs   [place]  2005   [place] Right 11/07/2013   RETINAL TEAR REPAIR CRYOTHERAPY Bilateral    per Dr. Tempie Hoist   RIGHT/LEFT HEART CATH AND CORONARY ANGIOGRAPHY N/A 02/13/2022   Procedure: RIGHT/LEFT HEART CATH AND CORONARY ANGIOGRAPHY;  Surgeon: Burnell Blanks, MD;  Location: [place] [facility];  Service: Cardiovascular;  Laterality: N/A;   rt bunion removed Left 1965   TEE WITHOUT CARDIOVERSION N/A 03/10/2022   Procedure: TRANSESOPHAGEAL ECHOCARDIOGRAM;  Surgeon: Lajuana Matte, MD;  Location: [place];  Service: Open Heart Surgery;  Laterality: N/A;    Allergies  Allergies  Allergen  Reactions   Ciprofloxacin     headaches    History of Present Illness    Matthew [name]  is a 72 year old male with the above mention past medical history who presents today for follow-up of recent AVR.  Mr. Fairbairn was initially seen by Dr. Angelena Form 01/14/2021 due to complaint of dyspnea and fatigue.  He completed a previous EKG by his PCP that showed EF of 55-60% with moderate basal septal hypertrophy and moderate aortic insufficiency, mild dilation of the aortic root.  He completed a cardiac CT to exclude coronary artery disease on 01/23/2021.  CT results showed calcium score 0 with no evidence of coronary artery disease.  He was seen in follow-up 03/2021 with improvement of shortness of breath.  In 12/2018 three 2D echo was repeated showing EF of 60 to 65% with severe aortic valve regurgitation.  Heart catheterization was completed 02/05/2022 that showed no evidence of CAD with normal heart pressures.  He underwent AVR with Dr. Kipp Brood on 03/10/2022 which was successful.  He developed orthostasis postprocedure and received 1 unit of [facility] with resolution.  He was treated with colchicine for prevention of inflammatory effusion.  He was started on low-dose Norvasc due to hypertension and Lasix and potassium due to volume overload.  He was discharged in stable on 03/16/2022.  Mr.  [name] presents today for post AVR replacement follow-up with his wife.  Since last being seen in the office patient reports that he is doing well from a cardiac perspective and has no complaints of shortness of breath. He is tolerating his current medications without any adverse reactions and blood pressure today is well-controlled at 112/84 and heart rate was 94 bpm.  He is been experiencing increased fatigue that has developed since his procedure.  He is euvolemic on examination. He is staying active and is walking twice a day for 15 minutes with his wife.  He has been having complaints of constipation and his last bowel movement  was last Tuesday.  I advised him to increase his intake of bulk fiber and abstain from using the laxatives instead use stool softeners.  Denies chest pain, palpitations, dyspnea, PND, orthopnea, nausea, vomiting, dizziness, syncope, edema, weight gain, or early satiety.  Home Medications    Current Outpatient Medications  Medication Sig Dispense Refill   amLODipine (NORVASC) 10 MG tablet Take 1 tablet (10 mg total) by mouth daily. 30 tablet 3   aspirin EC 325 MG tablet Take 1 tablet (325 mg total) by mouth daily.     b complex vitamins tablet Take 1 tablet by mouth daily.     Cholecalciferol (DIALYVITE VITAMIN D 5000) 125 MCG (5000 UT) capsule Take 5,000 Units by mouth daily.     diclofenac (VOLTAREN) 75 MG EC tablet Take 1 tablet (75 mg total) by mouth 2 (two) times daily. (Patient not taking: Reported on 03/04/2022) 30 tablet 0   furosemide (LASIX) 40 MG tablet Take 1 tablet (40 mg total) by mouth daily. 7 tablet 0   metoprolol tartrate (LOPRESSOR) 25 MG tablet Take 1 tablet (25 mg total) by mouth 2 (two) times daily. 60 tablet 3   oxyCODONE (OXY IR/ROXICODONE) 5 MG immediate release tablet Take 1 tablet (5 mg total) by mouth every 4 (four) hours as needed for severe pain. 30 tablet 0   potassium chloride SA (KLOR-CON M) 20 MEQ tablet Take 1 tablet (20 mEq total) by mouth daily. 7 tablet 0   PRESCRIPTION MEDICATION Inject 1 Dose as directed as needed (erectile dysfunction). papaverine-phentolamine-prostaglandin E1 penile injection     vitamin A 8000 UNIT capsule Take 8,000 Units by mouth daily.     vitamin C (ASCORBIC ACID) 500 MG tablet Take 500 mg by mouth daily.     vitamin E 180 MG (400 UNITS) capsule Take 400 Units by mouth daily.     No current facility-administered medications for this visit.     Review of Systems  Please see the history of present illness.    (+) Abdominal discomfort at umbilicus (+) Fatigue  All other systems reviewed and are otherwise negative except as noted  above.  Physical Exam    Wt Readings from Last 3 Encounters:  03/16/22 191 lb 4.8 oz (86.8 kg)  03/07/22 192 lb 14.4 oz (87.5 kg)  02/28/22 186 lb (84.4 kg)   [id number] were no vitals filed for this visit.,There is no height or weight on file to calculate BMI.  Constitutional:      Appearance: Healthy appearance. Not in distress.  Neck:     Vascular: JVD normal.  Pulmonary:     Effort: Pulmonary effort is normal.     Breath sounds: No wheezing.  Rales present on the right lower lobe diminished in the bases Cardiovascular:     Normal rate. Regular rhythm. Normal S1. Normal S2.  Murmurs: There is no murmur.  Median sternotomy incision clean dry and intact with no evidence of infection noted Edema:    Peripheral edema absent.  Abdominal:     Palpations: Abdomen is soft non tender. There is no hepatomegaly.  Skin:    General: Skin is warm and dry.  Neurological:     General: No focal deficit present.     Mental Status: Alert and oriented to person, place and time.     Cranial Nerves: Cranial nerves are intact.  EKG/LABS/ Recent Cardiac Studies    ECG personally reviewed by me today -none completed today   Lab Results  Component Value Date   WBC 11.9 (H) 03/14/2022   HGB 7.9 (L) 03/14/2022   HCT 22.9 (L) 03/14/2022   MCV 90.5 03/14/2022   PLT 168 03/14/2022   Lab Results  Component Value Date   CREATININE 0.84 03/14/2022   BUN 18 03/14/2022   NA 131 (L) 03/14/2022   K 4.2 03/14/2022   CL 95 (L) 03/14/2022   CO2 29 03/14/2022   Lab Results  Component Value Date   ALT 11 03/07/2022   AST 24 03/07/2022   ALKPHOS 63 03/07/2022   BILITOT 0.9 03/07/2022   Lab Results  Component Value Date   CHOL 168 09/17/2021   HDL 64.50 09/17/2021   LDLCALC 88 09/17/2021   TRIG 80.0 09/17/2021   CHOLHDL 3 09/17/2021    Lab Results  Component Value Date   HGBA1C 5.5 03/07/2022    Cardiac Studies & Procedures   CARDIAC CATHETERIZATION  CARDIAC CATHETERIZATION  02/13/2022  Narrative No angiographic evidence of CAD Normal right heart pressures (RA 4, RV 27/1/6, PA 28/8 mean 16, PCWP 8) LVEDP 15 mmHg. Severe aortic valve insufficiency by echo  Recommendations: Will plan outpatient referral to CT surgery to discuss surgical AVR.  Findings Coronary Findings Diagnostic  Dominance: Co-dominant  Left Anterior Descending Vessel is large.  Ramus Intermedius Vessel is large.  Left Circumflex Vessel is large.  Right Coronary Artery Vessel is large.  Intervention  No interventions have been documented.     ECHOCARDIOGRAM  ECHOCARDIOGRAM COMPLETE 12/09/2021  Narrative ECHOCARDIOGRAM REPORT    Patient Name:   Matthew [name] Date of Exam: 12/09/2021 Medical Rec #:  [id number]        Height:       67.5 in Accession #:    [id number]       Weight:       189.0 lb Date of Birth:  [birth year]        BSA:          1.985 m Patient Age:    11 years         BP:           150/72 mmHg Patient Gender: M                HR:           61 bpm. Exam Location:  [place]  Procedure: 2D Echo, Cardiac Doppler, Color Doppler, 3D Echo and Strain Analysis  Indications:    I35.1 AI  History:        Patient has prior history of Echocardiogram examinations, most recent 12/12/2020. AI; Risk Factors:Dilated aortic root.  Sonographer:    Coralyn Helling RDCS Referring Phys: [place]   1. The aortic valve is tricuspid. There is mild calcification of the aortic valve. There is mild thickening of the aortic valve. Aortic valve  regurgitation is severe with diastolic flow reversal noted in the descending aorta. Aortic valve sclerosis/calcification is present, without any evidence of aortic stenosis. Elevated gradients across the aortic valve likely driven by severe AR. 2. Left ventricular ejection fraction, by estimation, is 60 to 65%. The left ventricle has normal function. The left ventricle has no regional wall motion  abnormalities. Left ventricular diastolic parameters are consistent with Grade I diastolic dysfunction (impaired relaxation). 3. Right ventricular systolic function is normal. The right ventricular size is normal. Tricuspid regurgitation signal is inadequate for assessing PA pressure. 4. Left atrial size was mildly dilated. 5. The mitral valve is grossly normal. Trivial mitral valve regurgitation. 6. Pulmonic valve regurgitation is moderate. 7. Aortic dilatation noted. There is mild dilatation of the aortic root, measuring 41 mm. There is mild dilatation of the ascending aorta, measuring 42 mm. 8. The inferior vena cava is normal in size with greater than 50% respiratory variability, suggesting right atrial pressure of 3 mmHg.  Comparison(s): Compared to prior TTE in 12/2020, the AR now appears severe with diastolic flow reversal noted in the descending aorta.  FINDINGS Left Ventricle: Left ventricular ejection fraction, by estimation, is 60 to 65%. The left ventricle has normal function. The left ventricle has no regional wall motion abnormalities. 3D left ventricular ejection fraction analysis performed but not reported based on interpreter judgement due to suboptimal tracking. The left ventricular internal cavity size was normal in size. There is no left ventricular hypertrophy. Left ventricular diastolic parameters are consistent with Grade I diastolic dysfunction (impaired relaxation).  Right Ventricle: The right ventricular size is normal. No increase in right ventricular wall thickness. Right ventricular systolic function is normal. Tricuspid regurgitation signal is inadequate for assessing PA pressure. The tricuspid regurgitant velocity is 2.78 m/s, and with an assumed right atrial pressure of 3 mmHg, the estimated right ventricular systolic pressure is [id number] mmHg.  Left Atrium: Left atrial size was mildly dilated.  Right Atrium: Right atrial size was normal in size.  Pericardium: There  is no evidence of pericardial effusion.  Mitral Valve: The mitral valve is grossly normal. Trivial mitral valve regurgitation.  Tricuspid Valve: The tricuspid valve is normal in structure. Tricuspid valve regurgitation is mild.  Aortic Valve: Diastolic flow reversal noted in the descending aorta. The aortic valve is tricuspid. There is mild calcification of the aortic valve. There is mild thickening of the aortic valve. Aortic valve regurgitation is severe. Aortic regurgitation PHT measures 423 msec. Aortic valve sclerosis/calcification is present, without any evidence of aortic stenosis. Aortic valve mean gradient measures 10.0 mmHg. Aortic valve peak gradient measures 21.2 mmHg. Aortic valve area, by VTI measures 2.05 cm.  Pulmonic Valve: The pulmonic valve was normal in structure. Pulmonic valve regurgitation is moderate.  Aorta: Aortic dilatation noted. There is mild dilatation of the aortic root, measuring 41 mm. There is mild dilatation of the ascending aorta, measuring 42 mm.  Venous: The inferior vena cava is normal in size with greater than 50% respiratory variability, suggesting right atrial pressure of 3 mmHg.  IAS/Shunts: The atrial septum is grossly normal.   LEFT VENTRICLE PLAX 2D LVIDd:         5.40 cm   Diastology LVIDs:         3.80 cm   LV e' medial:    11.20 cm/s LV PW:         1.10 cm   LV E/e' medial:  4.8 LV IVS:        1.50 cm  LV e' lateral:   10.20 cm/s LVOT diam:     2.20 cm   LV E/e' lateral: 5.3 LV SV:         100 LV SV Index:   50        2D Longitudinal Strain LVOT Area:     3.80 cm  2D Strain GLS Avg:     -21.3 %  3D Volume EF: 3D EF:        55 % LV EDV:       270 ml LV ESV:       122 ml LV SV:        149 ml  RIGHT VENTRICLE             IVC RV S prime:     15.80 cm/s  IVC diam: 1.20 cm TAPSE (M-mode): 2.5 cm RVSP:           33.9 mmHg  LEFT ATRIUM             Index        RIGHT ATRIUM           Index LA diam:        4.30 cm 2.17 cm/m   RA  Pressure: 3.00 mmHg LA Vol (A2C):   75.6 ml 38.08 ml/m  RA Area:     17.70 cm LA Vol (A4C):   69.3 ml 34.91 ml/m  RA Volume:   49.80 ml  25.09 ml/m LA Biplane Vol: 77.0 ml 38.79 ml/m AORTIC VALVE AV Area (Vmax):    1.67 cm AV Area (Vmean):   1.92 cm AV Area (VTI):     2.05 cm AV Vmax:           230.00 cm/s AV Vmean:          146.000 cm/s AV VTI:            0.486 m AV Peak Grad:      21.2 mmHg AV Mean Grad:      10.0 mmHg LVOT Vmax:         101.00 cm/s LVOT Vmean:        73.900 cm/s LVOT VTI:          0.262 m LVOT/AV VTI ratio: 0.54 AI PHT:            423 msec  AORTA Ao Root diam: 4.10 cm Ao Asc diam:  4.20 cm  MITRAL VALVE               TRICUSPID VALVE MV Area (PHT): 2.37 cm    TR Peak grad:   30.9 mmHg MV Decel Time: 320 msec    TR Vmax:        278.00 cm/s MV E velocity: 53.80 cm/s  Estimated RAP:  3.00 mmHg MV A velocity: 56.40 cm/s  RVSP:           33.9 mmHg MV E/A ratio:  0.95 SHUNTS Systemic VTI:  0.26 m Systemic Diam: 2.20 cm  Gwyndolyn Kaufman MD Electronically signed by Gwyndolyn Kaufman MD Signature Date/Time: 12/09/2021/4:39:27 PM    Final   TEE  ECHO INTRAOPERATIVE TEE 03/10/2022  Narrative *INTRAOPERATIVE TRANSESOPHAGEAL REPORT *    Patient Name:   Matthew [name] Date of Exam: 03/10/2022 Medical Rec #:  [id number]        Height:       68.0 in Accession #:    [id number]       Weight:  192.0 lb Date of Birth:  1950/10/30        BSA:          2.01 m Patient Age:    40 years         BP:           159/85 mmHg Patient Gender: M                HR:           74 bpm. Exam Location:  Anesthesiology  Transesophogeal exam was perform intraoperatively during surgical procedure. Patient was closely monitored under general anesthesia during the entirety of examination.  Indications:     I35.1 Nonrheumatic aortic (valve) insufficiency Sonographer:     Raquel Sarna Senior RDCS Performing Phys: Suella Broad MD Diagnosing Phys: Suella Broad  MD  Complications: No known complications during this procedure. POST-OP IMPRESSIONS s/p AVR with 83mm Inspiris Valve _ Left Ventricle: LVEF unchanged with transient hypokinesis of the septal wall. CO > 4.5 L/min, CI > 2.5 L/min/m with support. _ Right Ventricle: The right ventricle appears unchanged from pre-bypass. _ Aorta: Stable dilation, no dissection noted after cannula removed. _ Left Atrium: The left atrium appears unchanged from pre-bypass. _ Left Atrial Appendage: The left atrial appendage appears unchanged from pre-bypass. _ Aortic Valve: s/p aortic valve replacement with 25 mm Inspiris valve, no rocking or perivalvular leaks noted. Normal mean gradient of 6 mmHg with no regurgitant flow. _ Mitral Valve: The mitral valve appears unchanged from pre-bypass. _ Tricuspid Valve: The tricuspid valve appears unchanged from pre-bypass. _ Pulmonic Valve: The pulmonic valve appears unchanged from pre-bypass. _ Interatrial Septum: The interatrial septum appears unchanged from pre-bypass. _ Interventricular Septum: The interventricular septum appears unchanged from pre-bypass. _ Pericardium: The pericardium appears unchanged from pre-bypass.  PRE-OP FINDINGS Left Ventricle: The left ventricle has low normal systolic function, with an ejection fraction of 50-55%. The cavity size was normal. No evidence of left ventricular regional wall motion abnormalities. There is no left ventricular hypertrophy. Left ventricular diastolic function could not be evaluated.  Right Ventricle: The right ventricle has normal systolic function. The cavity was normal. There is no increase in right ventricular wall thickness. There is no aneurysm seen.  Left Atrium: Left atrial size is slightly dilated. No left atrial/left atrial appendage thrombus was detected. Left atrial appendage velocity is normal at greater than 40 cm/s.  Right Atrium: Right atrial size was normal in size.  Interatrial Septum: No  atrial level shunt detected by color flow Doppler. There is no evidence of a patent foramen ovale.  Pericardium: There is no evidence of pericardial effusion.  Mitral Valve: The mitral valve is normal in structure. Mitral valve regurgitation is trivial by color flow Doppler. The MR jet is centrally-directed. There is no evidence of mitral valve vegetation. There is No evidence of mitral stenosis.  Tricuspid Valve: The tricuspid valve was normal in structure. Tricuspid valve regurgitation is trivial by color flow Doppler. The jet is directed centrally. No evidence of tricuspid stenosis is present. There is no evidence of tricuspid valve vegetation.  Aortic Valve: The aortic valve is tricuspid with moderate to severe regurgitation (jet approx. .55cm) by color flow Doppler. The jet is mostly centrally-directed with an eccentric component through the left and right coronary cusps leading to turbulent flow in the left ventricle, causing incomplete opening of the anterior mitral leaflet. There is no stenosis of the aortic valve. There is no evidence of aortic valve vegetation.  Pulmonic Valve: The  pulmonic valve was normal in structure, with normal. No evidence of pumonic stenosis. Pulmonic valve regurgitation is not visualized by color flow Doppler.   Aorta: The aortic arch and descrnding aorta are normal in size and structure with no significant plaque. There is mild dilatation of the aortic root and of the ascending aorta, measuring 41 mm.  Pulmonary Artery: The pulmonary artery is of normal size.  Shunts: There is no evidence of an atrial septal defect.   Suella Broad MD Electronically signed by Suella Broad MD Signature Date/Time: 03/10/2022/6:01:35 PM    Final    CT SCANS  CT CORONARY MORPH W/CTA COR W/SCORE 01/23/2021  Addendum 01/23/2021 12:54 PM ADDENDUM REPORT: 01/23/2021 12:52  CLINICAL DATA:  This a 72 year male with anginal symptoms.  EXAM: Cardiac/Coronary   CTA  TECHNIQUE: The patient was scanned on a [employer].  FINDINGS: A 100 kV prospective scan was triggered in the descending thoracic aorta at 111 HU's. Axial non-contrast 3 mm slices were carried out through the heart. The data set was analyzed on a dedicated work station and scored using the [place]. Gantry rotation speed was 250 msecs and collimation was .6 mm. No beta blockade and 0.8 mg of sl NTG was given. The 3D data set was reconstructed in 5% intervals of the 67-82 % of the R-R cycle. Diastolic phases were analyzed on a dedicated work station using MPR, MIP and VRT modes. The patient received 80 cc of contrast.  Aorta: Normal size.  No calcifications.  No dissection.  Aortic Valve:  Trileaflet.  No calcifications.  Coronary Arteries:  Normal coronary origin.  Right dominance.  RCA is a large dominant artery that gives rise to PDA and PLA. There is no plaque.  Left main is a large artery that gives rise to LAD, Intermedius Ramus and LCX arteries.  LAD is a large vessel that has no plaque.  Intermedius Ramus with no plaques.  LCX is a non-dominant artery that gives rise to one large OM1 branch. There is no plaque.  Coronary Calcium Score:  Left main: 0  Left anterior descending artery: 0  Left circumflex artery: 0  Right coronary artery: 0  Total: 0  Percentile: 0  Other findings:  Normal pulmonary vein drainage into the left atrium.  Normal left atrial appendage without a thrombus.  Normal size of the pulmonary artery.  IMPRESSION: 1. Coronary calcium score of 0. This was 0 percentile for age and sex matched control.  2. Normal coronary origin with right dominance.  3. CAD-RADS 0. No evidence of CAD (0%). Consider non-atherosclerotic causes of chest pain.   Electronically Signed By: Berniece Salines D.O. On: 01/23/2021 12:52  Narrative EXAM: OVER-READ INTERPRETATION  CT CHEST  The following report is an over-read  performed by radiologist Dr. Markus Daft of [facility], [place] on 01/23/2021. This over-read does not include interpretation of cardiac or coronary anatomy or pathology. The coronary calcium score/coronary CTA interpretation by the cardiologist is attached.  COMPARISON:  None.  FINDINGS: Vascular: Ascending thoracic aorta measures up to 3.9 cm. Limited evaluation of the pulmonary arteries.  Mediastinum/Nodes: Visualized mediastinal structures are normal.  Lungs/Pleura: Visualized lungs are clear.  Upper Abdomen: Low-density lesion with peripheral arterial enhancement in the central aspect of the liver on sequence 10 image 60. Structure is incompletely evaluated on this examination. There are at least 3 small hypodensities in the visualized liver that are likely incidental findings such as cysts.  Musculoskeletal: No acute bone abnormality.  IMPRESSION: 1. Indeterminate lesion in the central aspect of the liver that measures up to 2.7 cm. Recommend further characterization with liver MRI, with and without contrast.  These results will be called to the ordering clinician or representative by the Radiologist Assistant, and communication documented in the PACS or [employer].  Electronically Signed: By: Markus Daft M.D. On: 01/23/2021 11:09          Assessment & Plan    1.  History of aortic insufficiency: -s/p AVR with 25 mm Inspiris valve on 03/10/2022  -Today patient reports that he is feeling much better with no complaints of shortness of breath since his procedure.  Median sternotomy incision is clean dry and intact without any evidence of infection.  He is scheduled to follow-up with Dr. Kipp Brood in the next 2 weeks. -Patient had rales present and diminished breath sounds on the right lower lobe.  He denies any productive cough or shortness of breath. -We will have him take Lasix 20 mg x 3 days along with potassium 20 mEq. -He is scheduled to have a 2D echo  completed on 4/16 and also 2 view chest x-ray and follow-up with Dr. Kipp Brood. -SBE prophylaxis was discussed regarding dental procedures  2.  Essential hypertension: -Patient's blood pressure today was well-controlled at 112/84 -Continue continue metoprolol 25 mg twice daily  3.  Fatigue: -Patient reports increased fatigue since his valve replacement. -He was advised to contact our office if fatigue increases and we may consider decreasing metoprolol to 25 mg once daily  4.  History of prostate cancer: -Most recent PSA was 0.00 -Continue current treatment plan per PCP     Cardiac Rehabilitation Eligibility Assessment       Disposition: Follow-up with Lauree Chandler, MD or APP in 1 months    Medication Adjustments/Labs and Tests Ordered: Current medicines are reviewed at length with the patient today.  Concerns regarding medicines are outlined above.   Signed, Mable Fill, Marissa Nestle, NP 03/30/2022, 7:56 AM [facility]  Note:  This document was prepared using Dragon voice recognition software and may include unintentional dictation errors.

## 2022-03-31 ENCOUNTER — Ambulatory Visit: Payer: Medicare HMO | Attending: Nurse Practitioner | Admitting: Nurse Practitioner

## 2022-03-31 ENCOUNTER — Encounter: Payer: Self-pay | Admitting: Nurse Practitioner

## 2022-03-31 VITALS — BP 112/84 | HR 94 | Ht 68.0 in | Wt 182.0 lb

## 2022-03-31 DIAGNOSIS — I351 Nonrheumatic aortic (valve) insufficiency: Secondary | ICD-10-CM | POA: Diagnosis not present

## 2022-03-31 DIAGNOSIS — Z952 Presence of prosthetic heart valve: Secondary | ICD-10-CM

## 2022-03-31 DIAGNOSIS — I10 Essential (primary) hypertension: Secondary | ICD-10-CM | POA: Diagnosis not present

## 2022-03-31 MED ORDER — POTASSIUM CHLORIDE CRYS ER 20 MEQ PO TBCR: 20.0000 meq | EXTENDED_RELEASE_TABLET | Freq: Every day | ORAL | 0 refills | Status: DC | PRN

## 2022-03-31 MED ORDER — FUROSEMIDE 20 MG PO TABS: 20.0000 mg | ORAL_TABLET | Freq: Every day | ORAL | 0 refills | Status: DC | PRN

## 2022-03-31 NOTE — Patient Instructions (Addendum)
Medication Instructions:  START Lasix 20mg  Take 1 tablet daily for 3 days then as needed START Potassium 59meq 1 tablet daily for 3 days then as needed GET MIRALAX  AND 1 FLEET ENEMA *If you need a refill on your cardiac medications before your next appointment, please call your pharmacy*   Lab Work: 1 week BMET If you have labs (blood work) drawn today and your tests are completely normal, you will receive your results only by: [place] (if you have MyChart) OR A paper copy in the mail If you have any lab test that is abnormal or we need to change your treatment, we will call you to review the results.   Testing/Procedures: None ordered   Follow-Up: At [facility], you and your health needs are our priority.  As part of our continuing mission to provide you with exceptional heart care, we have created designated Provider Care Teams.  These Care Teams include your primary Cardiologist (physician) and Advanced Practice Providers (APPs -  Physician Assistants and Nurse Practitioners) who all work together to provide you with the care you need, when you need it.  We recommend signing up for the patient portal called "MyChart".  Sign up information is provided on this After Visit Summary.  MyChart is used to connect with patients for Virtual Visits (Telemedicine).  Patients are able to view lab/test results, encounter notes, upcoming appointments, etc.  Non-urgent messages can be sent to your provider as well.   To learn more about what you can do with MyChart, go to [email or web address].    Your next appointment:   1 month(s)  Provider:   Ambrose Pancoast, NP       Other Instructions INCREASE WATER INTAKE

## 2022-04-07 ENCOUNTER — Ambulatory Visit: Payer: Medicare HMO | Attending: Nurse Practitioner

## 2022-04-07 ENCOUNTER — Telehealth: Payer: Self-pay | Admitting: Nurse Practitioner

## 2022-04-07 DIAGNOSIS — I10 Essential (primary) hypertension: Secondary | ICD-10-CM | POA: Diagnosis not present

## 2022-04-07 DIAGNOSIS — Z952 Presence of prosthetic heart valve: Secondary | ICD-10-CM | POA: Diagnosis not present

## 2022-04-07 NOTE — Telephone Encounter (Signed)
Matthew [name] was contacted regarding his Lasix and potassium.  He was reminded to discontinue both medications after 3 days per our conversation during his follow-up last week.  He had all questions answered to his satisfaction and thanked me for the call today.  Ambrose Pancoast, NP

## 2022-04-07 NOTE — Telephone Encounter (Signed)
Good morning the patient need to speak with you about his medication dosage. He came in today.

## 2022-04-08 LAB — BASIC METABOLIC PANEL
BUN/Creatinine Ratio: 12 (ref 10–24)
BUN: 10 mg/dL (ref 8–27)
CO2: 23 mmol/L (ref 20–29)
Calcium: 9.5 mg/dL (ref 8.6–10.2)
Chloride: 101 mmol/L (ref 96–106)
Creatinine, Ser: 0.85 mg/dL (ref 0.76–1.27)
Glucose: 127 mg/dL — ABNORMAL HIGH (ref 70–99)
Potassium: 4.3 mmol/L (ref 3.5–5.2)
Sodium: 141 mmol/L (ref 134–144)
eGFR: 93 mL/min/{1.73_m2} (ref >59)

## 2022-04-15 ENCOUNTER — Ambulatory Visit
Admission: RE | Admit: 2022-04-15 | Discharge: 2022-04-15 | Disposition: A | Discharge status: Home / Self Care | Payer: Medicare HMO | Source: Ambulatory Visit | Attending: Thoracic Surgery (Cardiothoracic Vascular Surgery) | Admitting: Thoracic Surgery (Cardiothoracic Vascular Surgery)

## 2022-04-15 ENCOUNTER — Encounter: Payer: Self-pay | Admitting: Physician Assistant

## 2022-04-15 ENCOUNTER — Ambulatory Visit (INDEPENDENT_AMBULATORY_CARE_PROVIDER_SITE_OTHER): Payer: Self-pay | Admitting: Physician Assistant

## 2022-04-15 ENCOUNTER — Other Ambulatory Visit: Payer: Self-pay | Admitting: Thoracic Surgery (Cardiothoracic Vascular Surgery)

## 2022-04-15 VITALS — BP 123/79 | HR 74 | Resp 18 | Ht 68.0 in | Wt 185.0 lb

## 2022-04-15 DIAGNOSIS — Z952 Presence of prosthetic heart valve: Secondary | ICD-10-CM

## 2022-04-15 DIAGNOSIS — J90 Pleural effusion, not elsewhere classified: Secondary | ICD-10-CM | POA: Diagnosis not present

## 2022-04-15 NOTE — Patient Instructions (Addendum)
Take 40mg  of Lasix (2 tabs of 20mg ) for 7 days and 1 tablet of potassium for 7 days.  You may return to driving an automobile as long as you are no longer requiring oral narcotic pain relievers during the daytime.  It would be wise to start driving only short distances during the daylight and gradually increase from there as you feel comfortable.  Continue to avoid any heavy lifting or strenuous use of your arms or shoulders for at least a total of three months from the time of surgery.  After three months you may gradually increase how much you lift or otherwise use your arms or chest as tolerated, with limits based upon whether or not activities lead to the return of significant discomfort.   Endocarditis is a potentially serious infection of heart valves or inside lining of the heart.  It occurs more commonly in patients with diseased heart valves (such as patient's with aortic or mitral valve disease) and in patients who have undergone heart valve repair or replacement.  Certain surgical and dental procedures may put you at risk, such as dental cleaning, other dental procedures, or any surgery involving the respiratory, urinary, gastrointestinal tract, gallbladder or prostate gland.   To minimize your chances for develooping endocarditis, maintain good oral health and seek prompt medical attention for any infections involving the mouth, teeth, gums, skin or urinary tract.    Always notify your doctor or dentist about your underlying heart valve condition before having any invasive procedures. You will need to take antibiotics before certain procedures, including all routine dental cleanings or other dental procedures.  Your cardiologist or dentist should prescribe these antibiotics for you to be taken ahead of time.

## 2022-04-15 NOTE — Progress Notes (Signed)
[address]       Matthew [name] [address]             [phone]      HPI: [name] returns for routine postoperative follow-up having undergone Aortic Valve Replacement utilizing an Inspiris Resilia biopresthetic valve on 03/10/22 by Dr. Cliffton Asters. The patient's early postoperative recovery while in the hospital was notable for some orthostasis with ambulation and expected postoperative blood loss anemia that improved with PRBC transfusion. He was discharged in stable condition on 03/16/22. Since hospital discharge the patient reports some chest soreness with specific movements. He states his shortness of breath is improving and denies dizziness and LOC. He is ambulating well around his home. He does admit to some left handed weakness and an inability to straighten his left 4th and 5th fingers.   Current Outpatient Medications  Medication Sig Dispense Refill   amLODipine (NORVASC) 10 MG tablet Take 1 tablet (10 mg total) by mouth daily. 30 tablet 3   aspirin EC 325 MG tablet Take 1 tablet (325 mg total) by mouth daily.     furosemide (LASIX) 20 MG tablet Take 1 tablet (20 mg total) by mouth daily as needed. 20 tablet 0   linaCLOtide (LINZESS PO) Take 1 capsule by mouth 2 (two) times daily.     metoprolol tartrate (LOPRESSOR) 25 MG tablet Take 1 tablet (25 mg total) by mouth 2 (two) times daily. 60 tablet 3   OVER THE COUNTER MEDICATION      potassium chloride SA (KLOR-CON M20) 20 MEQ tablet Take 1 tablet (20 mEq total) by mouth daily as needed. 20 tablet 0   No current facility-administered medications for this visit.   Vitals: Today's Vitals   04/15/22 1447  BP: 123/79  Pulse: 74  Resp: 18  SpO2: 95%  Weight: 185 lb (83.9 kg)  Height: 5\' 8"  (1.727 m)   Body mass index is 28.13 kg/m.  Physical Exam: General: No acute distress Neuro: Alert and oriented, grossly intact. Slightly decreased strength of the left hand, 4th and 5th digits contracted unless  assisted to straighten CV: Regular rate and rhythm, no murmur Pulm: Slightly diminished right basilar breath sounds, otherwise clear to auscultation GI: Active bowel sounds, no tenderness Extremities: No edema Wound: Healing well, clean and dry. No sign of infection   Diagnostic Tests: CLINICAL DATA:  s/p AVR   EXAM: CHEST - 2 VIEW   COMPARISON:  Chest x-ray March 6, 24.   FINDINGS: Scarring in the left midlung. Small right pleural effusion. No visible pneumothorax. No consolidation. Similar cardiomediastinal silhouette. Median sternotomy.   IMPRESSION: 1. Scarring in the left midlung. 2. Small right pleural effusion.     Electronically Signed   By: Matthew [name] M.D.   On: 04/15/2022 14:26   Impression/Plan  [name] returns to the clinic almost 5 weeks out of aortic valve replacement. Overall he is progressing well. He admits to some sternal soreness but is no longer taking pain medication, he was cleared to drive. He admits that his shortness of breath is improving but he still has dyspnea with exertion, but he is still able to ambulate well. CXR today showed a small right pleural effusion. He has been given lasix when discharged from the hospital as well as a 3 day course after seeing the cardiologist for pleural effusion. I discussed the option of referral to IR for possible thoracentesis but he did not want to proceed at this time. Recommended  7 days of 40mg  Lasix and 20mg  Potassium. Discussed slowly increasing activity over the next couple of weeks. Will clear him for cardiac rehab today, he is willing to participate. He states he ran out of Metoprolol and Amlodipine today but has not refilled, he has refills available. Blood pressure looks good today, told patient to refill and continue current blood pressure regimen. Plan to return to the clinic with CXR in 1 week.   We discussed endocarditis prophylaxis.  He also reported left 4th and 5th finger contraction since  surgery as well as some left handed weakness and pain up his arm at times. He states it started after surgery and has not improved since. Possible Dupuytren contracture? Plan to refer to hand surgeon.   Matthew Reichmann, PA-C [facility] [phone]

## 2022-04-18 ENCOUNTER — Ambulatory Visit: Payer: Medicare HMO | Admitting: Thoracic Surgery (Cardiothoracic Vascular Surgery)

## 2022-04-21 ENCOUNTER — Other Ambulatory Visit: Payer: Self-pay | Admitting: Thoracic Surgery (Cardiothoracic Vascular Surgery)

## 2022-04-21 DIAGNOSIS — Z952 Presence of prosthetic heart valve: Secondary | ICD-10-CM

## 2022-04-22 ENCOUNTER — Ambulatory Visit
Admission: RE | Admit: 2022-04-22 | Discharge: 2022-04-22 | Disposition: A | Discharge status: Home / Self Care | Payer: Medicare HMO | Source: Ambulatory Visit | Attending: Thoracic Surgery (Cardiothoracic Vascular Surgery) | Admitting: Thoracic Surgery (Cardiothoracic Vascular Surgery)

## 2022-04-22 ENCOUNTER — Ambulatory Visit (HOSPITAL_COMMUNITY): Payer: Medicare HMO | Attending: Internal Medicine

## 2022-04-22 ENCOUNTER — Ambulatory Visit (INDEPENDENT_AMBULATORY_CARE_PROVIDER_SITE_OTHER): Payer: Self-pay | Admitting: Physician Assistant

## 2022-04-22 VITALS — BP 155/66 | HR 95 | Resp 20 | Ht 68.0 in | Wt 182.0 lb

## 2022-04-22 DIAGNOSIS — J90 Pleural effusion, not elsewhere classified: Secondary | ICD-10-CM | POA: Diagnosis not present

## 2022-04-22 DIAGNOSIS — Z952 Presence of prosthetic heart valve: Secondary | ICD-10-CM | POA: Diagnosis not present

## 2022-04-22 DIAGNOSIS — I771 Stricture of artery: Secondary | ICD-10-CM | POA: Diagnosis not present

## 2022-04-22 DIAGNOSIS — I77810 Thoracic aortic ectasia: Secondary | ICD-10-CM | POA: Diagnosis not present

## 2022-04-22 DIAGNOSIS — I088 Other rheumatic multiple valve diseases: Secondary | ICD-10-CM

## 2022-04-22 DIAGNOSIS — Z9889 Other specified postprocedural states: Secondary | ICD-10-CM | POA: Diagnosis not present

## 2022-04-22 LAB — ECHOCARDIOGRAM COMPLETE
AR max vel: 2.05 cm2
AV Area VTI: 2.15 cm2
AV Area mean vel: 1.98 cm2
AV Mean grad: 11.3 mmHg
AV Peak grad: 21.3 mmHg
Ao pk vel: 2.3 m/s
Area-P 1/2: 3.99 cm2
S' Lateral: 2.75 cm

## 2022-04-22 NOTE — Progress Notes (Signed)
[address]       [name] [address]             [phone]       HPI: [name] is a 72 year old male with a past history of hypertension and prostate cancer.  He was recently referred to [place] for surgical management of severe aortic insufficiency.  He underwent aortic valve replacement with a tissue valve on 03/10/2022 by Dr. Cliffton Asters and had an uneventful postoperative course.  He was seen in the office 1 week ago for scheduled follow-up and was noted to have a small pleural effusion on the right side.  He has been treated with Lasix 40 mg daily with supplemental potassium for the last 7 days and was asked to return today for follow-up chest x-ray. [name] is accompanied by his wife today and reports continued progress with his recovery.  He denies shortness of breath with activity and commented that he was able to take a deeper breath than he was able to do a week ago.  He also reported that he has regained some function in his left fourth and fifth fingers which have been weak in flexion since surgery.  Current Outpatient Medications  Medication Sig Dispense Refill   amLODipine (NORVASC) 10 MG tablet Take 1 tablet (10 mg total) by mouth daily. 30 tablet 3   aspirin EC 325 MG tablet Take 1 tablet (325 mg total) by mouth daily.     linaCLOtide (LINZESS PO) Take 1 capsule by mouth 2 (two) times daily.     metoprolol tartrate (LOPRESSOR) 25 MG tablet Take 1 tablet (25 mg total) by mouth 2 (two) times daily. 60 tablet 3   OVER THE COUNTER MEDICATION      No current facility-administered medications for this visit.    Physical Exam: Vital signs BP 155/66 Pulse 95 Respirations 20  SpO2 94% on room air  Heart: Regular rate and rhythm, no murmur Chest: Cool, breath sounds are clear, continues to be slightly diminished in the right base.  The sternotomy incision is well-healed and the sternum is stable Extremities: Well-perfused with no peripheral  edema Neuro: Intact  Diagnostic Tests: CLINICAL DATA:  s/p AVR   EXAM: CHEST - 2 VIEW   COMPARISON:  Chest x-ray March 6, 24.   FINDINGS: Scarring in the left midlung. Small right pleural effusion. No visible pneumothorax. No consolidation. Similar cardiomediastinal silhouette. Median sternotomy.   IMPRESSION: 1. Scarring in the left midlung. 2. Small right pleural effusion.     Electronically Signed   By: Feliberto Harts M.D.   On: 04/15/2022 14:26  CLINICAL DATA:  Status post AVR      EXAM: CHEST - 2 VIEW   COMPARISON:  X-ray 04/15/2022   FINDINGS: Postop chest with sternal wires. Tortuous and ectatic aorta. Stable cardiopericardial silhouette. No consolidation, pneumothorax or edema. Tiny right pleural effusion. Stable left midlung scar or atelectasis. Prosthetic aortic valve.   IMPRESSION: Postop chest. Stable tiny right effusion and left midlung scar or atelectasis.   Tortuous and ectatic aorta     Electronically Signed   By: Karen Kays M.D.   On: 04/22/2022 10:51  ECHOCARDIOGRAM REPORT       Patient Name:   Matthew [name] Date of Exam: 04/22/2022  Medical Rec #:  [id number]        Height:       68.0 in  Accession #:    [id number]  Weight:       185.0 lb  Date of Birth:  05-15-1950        BSA:          1.977 m  Patient Age:    71 years         BP:           123/79 mmHg  Patient Gender: M                HR:           82 bpm.  Exam Location:  [address]   Procedure: 2D Echo, Color Doppler, Cardiac Doppler and 3D Echo   Indications:    S/p Aortic Valve Replacement Z95.2    History:        Patient has prior history of Echocardiogram examinations,  most                 recent 03/10/2022.                  Aortic Valve: 25 mm Edwards Inspiris valve is present in  the                 aortic position. Procedure Date: 03/10/2022.    Sonographer:    Thurman Coyer RDCS  Referring Phys: [id number] Matthew [name]   IMPRESSIONS      1. Left ventricular ejection fraction, by estimation, is 55 to 60%. Left  ventricular ejection fraction by 3D volume is 62 %. The left ventricle has  normal function. The left ventricle has no regional wall motion  abnormalities. Left ventricular diastolic   parameters were normal. There is abnormal septal motion postoperative.   2. Right ventricular systolic function is mildly reduced. The right  ventricular size is normal. There is normal pulmonary artery systolic  pressure. The estimated right ventricular systolic pressure is 25.1 mmHg.   3. The mitral valve is grossly normal. No evidence of mitral valve  regurgitation. No evidence of mitral stenosis.   4. The aortic valve has been repaired/replaced. Aortic valve  regurgitation is not visualized. There is a 25 mm Edwards Inspiris valve  present in the aortic position. Procedure Date: 03/10/2022. Echo findings  are consistent with normal structure and  function of the aortic valve prosthesis. Aortic valve area, by VTI  measures 2.15 cm. Aortic valve mean gradient measures 11.3 mmHg. Aortic  valve Vmax measures 2.28 m/s. Aortic valve acceleration time measures 79  msec. DVI 0.41, AT/ET 0.33, iEOA 1.1 cm2.   5. Aortic dilatation noted. There is borderline dilatation of the aortic  root, measuring 40 mm. There is borderline dilatation of the ascending  aorta, measuring 41 mm.   6. The inferior vena cava is normal in size with greater than 50%  respiratory variability, suggesting right atrial pressure of 3 mmHg.   FINDINGS   Left Ventricle: Left ventricular ejection fraction, by estimation, is 55  to 60%. Left ventricular ejection fraction by 3D volume is 62 %. The left  ventricle has normal function. The left ventricle has no regional wall  motion abnormalities. The left  ventricular internal cavity size was normal in size. There is no left  ventricular hypertrophy. Abnormal (paradoxical) septal motion consistent  with post-operative  status and abnormal septal motion postoperative. Left  ventricular diastolic parameters were   normal.   Right Ventricle: The right ventricular size is normal. No increase in  right ventricular wall thickness. Right ventricular systolic function is  mildly reduced.  There is normal pulmonary artery systolic pressure. The  tricuspid regurgitant velocity is 2.35  m/s, and with an assumed right atrial pressure of 3 mmHg, the estimated  right ventricular systolic pressure is 25.1 mmHg.   Left Atrium: Left atrial size was normal in size.   Right Atrium: Right atrial size was normal in size.   Pericardium: There is no evidence of pericardial effusion.   Mitral Valve: The mitral valve is grossly normal. No evidence of mitral  valve regurgitation. No evidence of mitral valve stenosis.   Tricuspid Valve: The tricuspid valve is normal in structure. Tricuspid  valve regurgitation is mild . No evidence of tricuspid stenosis.   Aortic Valve: The aortic valve has been repaired/replaced. Aortic valve  regurgitation is not visualized. Aortic valve mean gradient measures 11.3  mmHg. Aortic valve peak gradient measures 21.3 mmHg. Aortic valve area, by  VTI measures 2.15 cm. There is a   25 mm Edwards Inspiris valve present in the aortic position. Procedure  Date: 03/10/2022. Echo findings are consistent with normal structure and  function of the aortic valve prosthesis.   Pulmonic Valve: The pulmonic valve was normal in structure. Pulmonic valve  regurgitation is mild. No evidence of pulmonic stenosis.   Aorta: Aortic dilatation noted. There is borderline dilatation of the  aortic root, measuring 40 mm. There is borderline dilatation of the  ascending aorta, measuring 41 mm.   Venous: The inferior vena cava is normal in size with greater than 50%  respiratory variability, suggesting right atrial pressure of 3 mmHg.   IAS/Shunts: The interatrial septum was not well visualized.     LEFT  VENTRICLE  PLAX 2D  LVIDd:         3.90 cm         Diastology  LVIDs:         2.75 cm         LV e' medial:    7.18 cm/s  LV PW:         1.30 cm         LV E/e' medial:  7.9  LV IVS:        1.40 cm         LV e' lateral:   10.20 cm/s  LVOT diam:     2.60 cm         LV E/e' lateral: 5.5  LV SV:         82  LV SV Index:   41  LVOT Area:     5.31 cm        3D Volume EF                                 LV 3D EF:    Left                                              ventricul                                              ar  ejection                                              fraction                                              by 3D                                              volume is                                              62 %.                                   3D Volume EF:                                 3D EF:        62 %                                 LV EDV:       141 ml                                 LV ESV:       53 ml                                 LV SV:        88 ml   RIGHT VENTRICLE  RV Basal diam:  2.80 cm  RV S prime:     18.00 cm/s   LEFT ATRIUM             Index        RIGHT ATRIUM           Index  LA diam:        4.30 cm 2.17 cm/m   RA Area:     16.90 cm  LA Vol (A2C):   25.7 ml 13.00 ml/m  RA Volume:   38.40 ml  19.42 ml/m  LA Vol (A4C):   25.2 ml 12.75 ml/m  LA Biplane Vol: 26.1 ml 13.20 ml/m   AORTIC VALVE  AV Area (Vmax):    2.05 cm  AV Area (Vmean):   1.98 cm  AV Area (VTI):     2.15 cm  AV Vmax:           230.49 cm/s  AV Vmean:          154.776 cm/s  AV VTI:            0.380 m  AV Peak Grad:  21.3 mmHg  AV Mean Grad:      11.3 mmHg  LVOT Vmax:         88.80 cm/s  LVOT Vmean:        57.700 cm/s  LVOT VTI:          0.154 m  LVOT/AV VTI ratio: 0.41    AORTA  Ao Root diam: 4.00 cm  Ao Asc diam:  4.10 cm   MITRAL VALVE               TRICUSPID VALVE  MV Area (PHT): 3.99 cm    TR  Peak grad:   22.1 mmHg  MV Decel Time: 190 msec    TR Vmax:        235.00 cm/s  MV E velocity: 56.60 cm/s  MV A velocity: 51.40 cm/s  SHUNTS  MV E/A ratio:  1.10        Systemic VTI:  0.15 m                             Systemic Diam: 2.60 cm   Weston Brass MD  Electronically signed by Weston Brass MD  Signature Date/Time: 04/22/2022/12:49:21 PM         Impression / Plan: Mr. Pickrell continues to make a progressive and satisfactory recovery following aortic valve replacement for severe aortic insufficiency.  He is regaining and endurance.  He is anxious to return to his exercise routine which was quite extensive prior to having surgery.  He is currently using an elliptical exercise machine as well as a recumbent bike.  He has a small right pleural effusion that I believe will continue to resolve without any further intervention. He is interested in participating in cardiac rehab but after contacting the rehab team, he discovered there was an extensive wait list for participants.  He plans to continue to gradually advance his activity with his usual exercise routine while observing sternal precautions which were reviewed today. Medications were reviewed and no changes are indicated at this time.  He was not taking any blood pressure medications prior to surgery and I explained that if his blood pressure normalizes after recovery he may be able to stop the amlodipine and/or metoprolol but he should continue these for now. We also discussed the findings of the chest x-ray and echocardiogram he had today.  Mr. Albornoz has a referral to the orthopedic surgery group (Dr. Carola Frost) to evaluate the weakness in his left fourth and fifth fingers.  However, this is gradually improving and may not need any further evaluation or intervention.  No further follow-up is scheduled but we will be happy to see Mr. Gosse the other needs arise related to the surgery.   Leary Roca, PA-C [facility] [phone]

## 2022-04-22 NOTE — Patient Instructions (Signed)
No change in medications at this time but you may be able to discontinue the amlodipine and or metoprolol if your blood pressure normalizes in the coming months.  Continue to avoid any lifting greater than 15 pounds until you are 3 months post surgery.  After that, you may gradually increase your activity without limitation.  Follow-up as needed.

## 2022-04-30 NOTE — Progress Notes (Unsigned)
Office Visit    Patient Name: Matthew [name] Date of Encounter: 04/30/2022  Primary Care Provider:  Nelwyn Salisbury, MD Primary Cardiologist:  Matthew Carrow, MD Primary Electrophysiologist: None   Past Medical History    Past Medical History:  Diagnosis Date   Asthma    as child   Cancer    prostate    Cataract    Dyspnea    exertional dyspnea   ED (erectile dysfunction)    Fracture    rt ankle   Headache    Hx Migraines   Heart murmur    Hemorrhoids    History of colonic polyps    History of prostate cancer    see's Dr. Brunilda Payor   Pneumonia    50+ years ago   Past Surgical History:  Procedure Laterality Date   ANKLE FRACTURE SURGERY Left 2001   AORTIC VALVE REPLACEMENT N/A 03/10/2022   Procedure: AORTIC VALVE REPLACEMENT WITH EDWARDS INSPIRIS VALVE;  Surgeon: Matthew Skains, MD;  Location: [facility] OR;  Service: Open Heart Surgery;  Laterality: N/A;   CATARACT EXTRACTION W/ INTRAOCULAR LENS IMPLANT Bilateral    per Matthew [name]    COLONOSCOPY  11/18/2018   per Matthew [name], adenomatous polyps, repeat in 7 yrs   HEMORRHOID SURGERY  1999   PROSTATECTOMY  2005   PROSTATECTOMY  1996   REFRACTIVE SURGERY Right 11/07/2013   RETINAL TEAR REPAIR CRYOTHERAPY Bilateral    per Dr. Alan Mulder   RIGHT/LEFT HEART CATH AND CORONARY ANGIOGRAPHY N/A 02/13/2022   Procedure: RIGHT/LEFT HEART CATH AND CORONARY ANGIOGRAPHY;  Surgeon: Kathleene Hazel, MD;  Location: [facility] INVASIVE CV LAB;  Service: Cardiovascular;  Laterality: N/A;   rt bunion removed Left 1965   TEE WITHOUT CARDIOVERSION N/A 03/10/2022   Procedure: TRANSESOPHAGEAL ECHOCARDIOGRAM;  Surgeon: Matthew Skains, MD;  Location: [facility] OR;  Service: Open Heart Surgery;  Laterality: N/A;    Allergies  Allergies  Allergen Reactions   Ciprofloxacin     headaches     History of Present Illness    Matthew [name] is a 72 y.o. 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 [name] was initially seen by Matthew [name] 01/14/2021 due to complaint of dyspnea and fatigue.  He completed a previous EKG by his PCP that showed EF of 55-60% with moderate basal septal hypertrophy and moderate aortic insufficiency, mild dilation of the aortic root.  He completed a cardiac CT to exclude coronary artery disease on 01/23/2021. CT results showed calcium score 0 with no evidence of coronary artery disease.  He was seen in follow-up 03/2021 with improvement of shortness of breath.  In 12/2018 three 2D echo was repeated showing EF of 60 to 65% with severe aortic valve regurgitation.  Heart catheterization was completed 02/05/2022 that showed no evidence of CAD with normal heart pressures.  He underwent AVR with Dr. Cliffton [name] on 03/10/2022 which was successful.  He developed orthostasis postprocedure and received 1 unit of PRBC with resolution.  He was treated with colchicine for prevention of inflammatory effusion.  He was started on low-dose Norvasc due to hypertension and Lasix and potassium due to volume overload.  He was discharged in stable on 03/16/2022.    On 03/31/2022 postprocedure patient reported increased fatigue and bouts of constipation.  He had rales and diminished breath sounds on the right side and was instructed to take Lasix 20  mg x 3 days.  He underwent 2D echo and 2 view chest x-ray by Dr. Cliffton [name] that showed small right pleural effusion.  He was offered referral to IR for possible thoracentesis and elected treatment with Lasix 40 mg x 7 days with 20 mg of potassium daily.  He was cleared for cardiac rehab but was put on wait list due to extensive participants.    [name] presents today for 1 month follow-up with his wife.  Since last being seen in the office patient reports that he is wearing much better and fatigue has resolved.  He is still having issues with constipation and bowel irregularity.  We discussed increasing his  fiber intake with Benefiber and also increasing fluid intake.  He completed his scheduled Lasix and reports that his breathing has been much improved with no coughing.  He reports no sternal incision no chest pain but does report occasional twinge of chest discomfort that occurred on the left side yesterday while doing planting and taking.  He denied any associated shortness of breath or shooting pain down the arm or of the neck.  We discussed the importance of abstaining from heavy lifting and overworking during the healing of his incision.  He recently completed a postop echocardiogram that shows stable gradient.  He is still having difficulty with extending his left pinky and was referred to a hand specialist but has an extended wait due to increased demand.  Patient denies chest pain, palpitations, dyspnea, PND, orthopnea, nausea, vomiting, dizziness, syncope, edema, weight gain, or early satiety.     Home Medications    Current Outpatient Medications  Medication Sig Dispense Refill   amLODipine (NORVASC) 10 MG tablet Take 1 tablet (10 mg total) by mouth daily. 30 tablet 3   aspirin EC 325 MG tablet Take 1 tablet (325 mg total) by mouth daily.     linaCLOtide (LINZESS PO) Take 1 capsule by mouth 2 (two) times daily.     metoprolol tartrate (LOPRESSOR) 25 MG tablet Take 1 tablet (25 mg total) by mouth 2 (two) times daily. 60 tablet 3   OVER THE COUNTER MEDICATION      No current facility-administered medications for this visit.     Review of Systems  Please see the history of present illness.    (+) Left hand pinky tightness (+) Chest discomfortg  All other systems reviewed and are otherwise negative except as noted above.  Physical Exam    Wt Readings from Last 3 Encounters:  04/22/22 182 lb (82.6 kg)  04/15/22 185 lb (83.9 kg)  03/31/22 182 lb (82.6 kg)   NF:AOZHY were no vitals filed for this visit.,There is no height or weight on file to calculate BMI.  Constitutional:       Appearance: Healthy appearance. Not in distress.  Neck:     Vascular: JVD normal.  Pulmonary:     Effort: Pulmonary effort is normal.     Breath sounds: No wheezing. No rales. Diminished in the bases Cardiovascular:     Normal rate. Regular rhythm. Normal S1. Normal S2.      Murmurs: There is no murmur.  Edema:    Peripheral edema absent.  Abdominal:     Palpations: Abdomen is soft non tender. There is no hepatomegaly.  Skin:    General: Skin is warm and dry.  Neurological:     General: No focal deficit present.     Mental Status: Alert and oriented to person, place and time.  Cranial Nerves: Cranial nerves are intact.  EKG/LABS/ Recent Cardiac Studies    ECG personally reviewed by me today -none completed today  Cardiac Studies & Procedures   CARDIAC CATHETERIZATION  CARDIAC CATHETERIZATION 02/13/2022  Narrative No angiographic evidence of CAD Normal right heart pressures (RA 4, RV 27/1/6, PA 28/8 mean 16, PCWP 8) LVEDP 15 mmHg. Severe aortic valve insufficiency by echo  Recommendations: Will plan outpatient referral to CT surgery to discuss surgical AVR.  Findings Coronary Findings Diagnostic  Dominance: Co-dominant  Left Anterior Descending Vessel is large.  Ramus Intermedius Vessel is large.  Left Circumflex Vessel is large.  Right Coronary Artery Vessel is large.  Intervention  No interventions have been documented.     ECHOCARDIOGRAM  ECHOCARDIOGRAM COMPLETE 04/22/2022  Narrative ECHOCARDIOGRAM REPORT    Patient Name:   Avel Sensor Date of Exam: 04/22/2022 Medical Rec #:  [id number]        Height:       68.0 in Accession #:    [id number]       Weight:       185.0 lb Date of Birth:  Nov 10, 1950        BSA:          1.977 m Patient Age:    71 years         BP:           123/79 mmHg Patient Gender: M                HR:           82 bpm. Exam Location:  [address]  Procedure: 2D Echo, Color Doppler, Cardiac Doppler and 3D  Echo  Indications:    S/p Aortic Valve Replacement Z95.2  History:        Patient has prior history of Echocardiogram examinations, most recent 03/10/2022. Aortic Valve: 25 mm Edwards Inspiris valve is present in the aortic position. Procedure Date: 03/10/2022.  Sonographer:    Thurman Coyer RDCS Referring Phys: [id number] CHRISTOPHER D MCALHANY  IMPRESSIONS   1. Left ventricular ejection fraction, by estimation, is 55 to 60%. Left ventricular ejection fraction by 3D volume is 62 %. The left ventricle has normal function. The left ventricle has no regional wall motion abnormalities. Left ventricular diastolic parameters were normal. There is abnormal septal motion postoperative. 2. Right ventricular systolic function is mildly reduced. The right ventricular size is normal. There is normal pulmonary artery systolic pressure. The estimated right ventricular systolic pressure is 25.1 mmHg. 3. The mitral valve is grossly normal. No evidence of mitral valve regurgitation. No evidence of mitral stenosis. 4. The aortic valve has been repaired/replaced. Aortic valve regurgitation is not visualized. There is a 25 mm Edwards Inspiris valve present in the aortic position. Procedure Date: 03/10/2022. Echo findings are consistent with normal structure and function of the aortic valve prosthesis. Aortic valve area, by VTI measures 2.15 cm. Aortic valve mean gradient measures 11.3 mmHg. Aortic valve Vmax measures 2.28 m/s. Aortic valve acceleration time measures 79 msec. DVI 0.41, AT/ET 0.33, iEOA 1.1 cm2. 5. Aortic dilatation noted. There is borderline dilatation of the aortic root, measuring 40 mm. There is borderline dilatation of the ascending aorta, measuring 41 mm. 6. The inferior vena cava is normal in size with greater than 50% respiratory variability, suggesting right atrial pressure of 3 mmHg.  FINDINGS Left Ventricle: Left ventricular ejection fraction, by estimation, is 55 to 60%. Left ventricular  ejection fraction by 3D volume is 62 %.  The left ventricle has normal function. The left ventricle has no regional wall motion abnormalities. The left ventricular internal cavity size was normal in size. There is no left ventricular hypertrophy. Abnormal (paradoxical) septal motion consistent with post-operative status and abnormal septal motion postoperative. Left ventricular diastolic parameters were normal.  Right Ventricle: The right ventricular size is normal. No increase in right ventricular wall thickness. Right ventricular systolic function is mildly reduced. There is normal pulmonary artery systolic pressure. The tricuspid regurgitant velocity is 2.35 m/s, and with an assumed right atrial pressure of 3 mmHg, the estimated right ventricular systolic pressure is 25.1 mmHg.  Left Atrium: Left atrial size was normal in size.  Right Atrium: Right atrial size was normal in size.  Pericardium: There is no evidence of pericardial effusion.  Mitral Valve: The mitral valve is grossly normal. No evidence of mitral valve regurgitation. No evidence of mitral valve stenosis.  Tricuspid Valve: The tricuspid valve is normal in structure. Tricuspid valve regurgitation is mild . No evidence of tricuspid stenosis.  Aortic Valve: The aortic valve has been repaired/replaced. Aortic valve regurgitation is not visualized. Aortic valve mean gradient measures 11.3 mmHg. Aortic valve peak gradient measures 21.3 mmHg. Aortic valve area, by VTI measures 2.15 cm. There is a 25 mm Edwards Inspiris valve present in the aortic position. Procedure Date: 03/10/2022. Echo findings are consistent with normal structure and function of the aortic valve prosthesis.  Pulmonic Valve: The pulmonic valve was normal in structure. Pulmonic valve regurgitation is mild. No evidence of pulmonic stenosis.  Aorta: Aortic dilatation noted. There is borderline dilatation of the aortic root, measuring 40 mm. There is borderline dilatation  of the ascending aorta, measuring 41 mm.  Venous: The inferior vena cava is normal in size with greater than 50% respiratory variability, suggesting right atrial pressure of 3 mmHg.  IAS/Shunts: The interatrial septum was not well visualized.   LEFT VENTRICLE PLAX 2D LVIDd:         3.90 cm         Diastology LVIDs:         2.75 cm         LV e' medial:    7.18 cm/s LV PW:         1.30 cm         LV E/e' medial:  7.9 LV IVS:        1.40 cm         LV e' lateral:   10.20 cm/s LVOT diam:     2.60 cm         LV E/e' lateral: 5.5 LV SV:         82 LV SV Index:   41 LVOT Area:     5.31 cm        3D Volume EF LV 3D EF:    Left ventricul ar ejection fraction by 3D volume is 62 %.  3D Volume EF: 3D EF:        62 % LV EDV:       141 ml LV ESV:       53 ml LV SV:        88 ml  RIGHT VENTRICLE RV Basal diam:  2.80 cm RV S prime:     18.00 cm/s  LEFT ATRIUM             Index        RIGHT ATRIUM           Index  LA diam:        4.30 cm 2.17 cm/m   RA Area:     16.90 cm LA Vol (A2C):   25.7 ml 13.00 ml/m  RA Volume:   38.40 ml  19.42 ml/m LA Vol (A4C):   25.2 ml 12.75 ml/m LA Biplane Vol: 26.1 ml 13.20 ml/m AORTIC VALVE AV Area (Vmax):    2.05 cm AV Area (Vmean):   1.98 cm AV Area (VTI):     2.15 cm AV Vmax:           230.49 cm/s AV Vmean:          154.776 cm/s AV VTI:            0.380 m AV Peak Grad:      21.3 mmHg AV Mean Grad:      11.3 mmHg LVOT Vmax:         88.80 cm/s LVOT Vmean:        57.700 cm/s LVOT VTI:          0.154 m LVOT/AV VTI ratio: 0.41  AORTA Ao Root diam: 4.00 cm Ao Asc diam:  4.10 cm  MITRAL VALVE               TRICUSPID VALVE MV Area (PHT): 3.99 cm    TR Peak grad:   22.1 mmHg MV Decel Time: 190 msec    TR Vmax:        235.00 cm/s MV E velocity: 56.60 cm/s MV A velocity: 51.40 cm/s  SHUNTS MV E/A ratio:  1.10        Systemic VTI:  0.15 m Systemic Diam: 2.60 cm  Weston Brass MD Electronically signed by Weston Brass  MD Signature Date/Time: 04/22/2022/12:49:21 PM    Final   TEE  ECHO INTRAOPERATIVE TEE 03/10/2022  Narrative *INTRAOPERATIVE TRANSESOPHAGEAL REPORT *    Patient Name:   Avel Sensor Date of Exam: 03/10/2022 Medical Rec #:  [id number]        Height:       68.0 in Accession #:    [id number]       Weight:       192.0 lb Date of Birth:  08/31/1950        BSA:          2.01 m Patient Age:    71 years         BP:           159/85 mmHg Patient Gender: M                HR:           74 bpm. Exam Location:  Anesthesiology  Transesophogeal exam was perform intraoperatively during surgical procedure. Patient was closely monitored under general anesthesia during the entirety of examination.  Indications:     I35.1 Nonrheumatic aortic (valve) insufficiency Sonographer:     Irving Burton Senior RDCS Performing Phys: Shona Simpson MD Diagnosing Phys: Shona Simpson MD  Complications: No known complications during this procedure. POST-OP IMPRESSIONS s/p AVR with 25mm Inspiris Valve _ Left Ventricle: LVEF unchanged with transient hypokinesis of the septal wall. CO > 4.5 L/min, CI > 2.5 L/min/m with support. _ Right Ventricle: The right ventricle appears unchanged from pre-bypass. _ Aorta: Stable dilation, no dissection noted after cannula removed. _ Left Atrium: The left atrium appears unchanged from pre-bypass. _ Left Atrial Appendage: The left atrial appendage appears unchanged from pre-bypass. _ Aortic Valve: s/p aortic valve replacement with 25  mm Inspiris valve, no rocking or perivalvular leaks noted. Normal mean gradient of 6 mmHg with no regurgitant flow. _ Mitral Valve: The mitral valve appears unchanged from pre-bypass. _ Tricuspid Valve: The tricuspid valve appears unchanged from pre-bypass. _ Pulmonic Valve: The pulmonic valve appears unchanged from pre-bypass. _ Interatrial Septum: The interatrial septum appears unchanged from pre-bypass. _ Interventricular Septum: The  interventricular septum appears unchanged from pre-bypass. _ Pericardium: The pericardium appears unchanged from pre-bypass.  PRE-OP FINDINGS Left Ventricle: The left ventricle has low normal systolic function, with an ejection fraction of 50-55%. The cavity size was normal. No evidence of left ventricular regional wall motion abnormalities. There is no left ventricular hypertrophy. Left ventricular diastolic function could not be evaluated.  Right Ventricle: The right ventricle has normal systolic function. The cavity was normal. There is no increase in right ventricular wall thickness. There is no aneurysm seen.  Left Atrium: Left atrial size is slightly dilated. No left atrial/left atrial appendage thrombus was detected. Left atrial appendage velocity is normal at greater than 40 cm/s.  Right Atrium: Right atrial size was normal in size.  Interatrial Septum: No atrial level shunt detected by color flow Doppler. There is no evidence of a patent foramen ovale.  Pericardium: There is no evidence of pericardial effusion.  Mitral Valve: The mitral valve is normal in structure. Mitral valve regurgitation is trivial by color flow Doppler. The MR jet is centrally-directed. There is no evidence of mitral valve vegetation. There is No evidence of mitral stenosis.  Tricuspid Valve: The tricuspid valve was normal in structure. Tricuspid valve regurgitation is trivial by color flow Doppler. The jet is directed centrally. No evidence of tricuspid stenosis is present. There is no evidence of tricuspid valve vegetation.  Aortic Valve: The aortic valve is tricuspid with moderate to severe regurgitation (jet approx. .55cm) by color flow Doppler. The jet is mostly centrally-directed with an eccentric component through the left and right coronary cusps leading to turbulent flow in the left ventricle, causing incomplete opening of the anterior mitral leaflet. There is no stenosis of the aortic valve. There is  no evidence of aortic valve vegetation.  Pulmonic Valve: The pulmonic valve was normal in structure, with normal. No evidence of pumonic stenosis. Pulmonic valve regurgitation is not visualized by color flow Doppler.   Aorta: The aortic arch and descrnding aorta are normal in size and structure with no significant plaque. There is mild dilatation of the aortic root and of the ascending aorta, measuring 41 mm.  Pulmonary Artery: The pulmonary artery is of normal size.  Shunts: There is no evidence of an atrial septal defect.   Shona Simpson MD Electronically signed by Shona Simpson MD Signature Date/Time: 03/10/2022/6:01:35 PM    Final    CT SCANS  CT CORONARY MORPH W/CTA COR W/SCORE 01/23/2021  Addendum 01/23/2021 12:54 PM ADDENDUM REPORT: 01/23/2021 12:52  CLINICAL DATA:  This a 72 year male with anginal symptoms.  EXAM: Cardiac/Coronary  CTA  TECHNIQUE: The patient was scanned on a [employer].  FINDINGS: A 100 kV prospective scan was triggered in the descending thoracic aorta at 111 HU's. Axial non-contrast 3 mm slices were carried out through the heart. The data set was analyzed on a dedicated work station and scored using the Agatson method. Gantry rotation speed was 250 msecs and collimation was .6 mm. No beta blockade and 0.8 mg of sl NTG was given. The 3D data set was reconstructed in 5% intervals of the 67-82 % of  the R-R cycle. Diastolic phases were analyzed on a dedicated work station using MPR, MIP and VRT modes. The patient received 80 cc of contrast.  Aorta: Normal size.  No calcifications.  No dissection.  Aortic Valve:  Trileaflet.  No calcifications.  Coronary Arteries:  Normal coronary origin.  Right dominance.  RCA is a large dominant artery that gives rise to PDA and PLA. There is no plaque.  Left main is a large artery that gives rise to LAD, Intermedius Ramus and LCX arteries.  LAD is a large vessel that has no  plaque.  Intermedius Ramus with no plaques.  LCX is a non-dominant artery that gives rise to one large OM1 branch. There is no plaque.  Coronary Calcium Score:  Left main: 0  Left anterior descending artery: 0  Left circumflex artery: 0  Right coronary artery: 0  Total: 0  Percentile: 0  Other findings:  Normal pulmonary vein drainage into the left atrium.  Normal left atrial appendage without a thrombus.  Normal size of the pulmonary artery.  IMPRESSION: 1. Coronary calcium score of 0. This was 0 percentile for age and sex matched control.  2. Normal coronary origin with right dominance.  3. CAD-RADS 0. No evidence of CAD (0%). Consider non-atherosclerotic causes of chest pain.   Electronically Signed By: Thomasene Ripple D.O. On: 01/23/2021 12:52  Narrative EXAM: OVER-READ INTERPRETATION  CT CHEST  The following report is an over-read performed by radiologist Dr. Richarda Overlie of [facility] on 01/23/2021. This over-read does not include interpretation of cardiac or coronary anatomy or pathology. The coronary calcium score/coronary CTA interpretation by the cardiologist is attached.  COMPARISON:  None.  FINDINGS: Vascular: Ascending thoracic aorta measures up to 3.9 cm. Limited evaluation of the pulmonary arteries.  Mediastinum/Nodes: Visualized mediastinal structures are normal.  Lungs/Pleura: Visualized lungs are clear.  Upper Abdomen: Low-density lesion with peripheral arterial enhancement in the central aspect of the liver on sequence 10 image 60. Structure is incompletely evaluated on this examination. There are at least 3 small hypodensities in the visualized liver that are likely incidental findings such as cysts.  Musculoskeletal: No acute bone abnormality.  IMPRESSION: 1. Indeterminate lesion in the central aspect of the liver that measures up to 2.7 cm. Recommend further characterization with liver MRI, with and without  contrast.  These results will be called to the ordering clinician or representative by the Radiologist Assistant, and communication documented in the PACS or [employer].  Electronically Signed: By: Richarda Overlie M.D. On: 01/23/2021 11:09          Risk Assessment/Calculations:            Lab Results  Component Value Date   WBC 11.9 (H) 03/14/2022   HGB 7.9 (L) 03/14/2022   HCT 22.9 (L) 03/14/2022   MCV 90.5 03/14/2022   PLT 168 03/14/2022   Lab Results  Component Value Date   CREATININE 0.85 04/07/2022   BUN 10 04/07/2022   NA 141 04/07/2022   K 4.3 04/07/2022   CL 101 04/07/2022   CO2 23 04/07/2022   Lab Results  Component Value Date   ALT 11 03/07/2022   AST 24 03/07/2022   ALKPHOS 63 03/07/2022   BILITOT 0.9 03/07/2022   Lab Results  Component Value Date   CHOL 168 09/17/2021   HDL 64.50 09/17/2021   LDLCALC 88 09/17/2021   TRIG 80.0 09/17/2021   CHOLHDL 3 09/17/2021    Lab Results  Component Value Date  HGBA1C 5.5 03/07/2022     Assessment & Plan   1.  History of aortic insufficiency: -s/p AVR with 25 mm Inspiris valve on 03/10/2022  -Today patient reports he is feeling much better with less fatigue and no shortness of breath. -He does note some episodes of chest discomfort that occurred with digging and yard work. -He reports resolution of discomfort the following day and was advised to contact us if discomfort returns and is worse in intensity. -SBE prophylaxis encouraged for any dental work  2.  Essential hypertension: -Patient's blood pressure today was well-controlled at 110/72 -Continue continue metoprolol 25 mg twice daily -We will reduce amlodipine to 5 mg daily   3.  Fatigue: -Patient reports improvement to fatigue and activity level has increased over the past month.  4.  History of prostate cancer: -Most recent PSA was 0.00 -Continue current treatment plan per PCP  5.  Shortness of breath: -Patient reports improvement to  shortness of breath after completing Lasix for pleural effusion.     Cardiac Rehabilitation Eligibility Assessment  The patient is ready to start cardiac rehabilitation from a cardiac standpoint.     Disposition: Follow-up with Matthew Carrow, MD or APP in 3 months     Medication Adjustments/Labs and Tests Ordered: Current medicines are reviewed at length with the patient today.  Concerns regarding medicines are outlined above.   Signed, Napoleon Form, Leodis Rains, NP 04/30/2022, 6:55 PM [facility]

## 2022-05-01 ENCOUNTER — Telehealth (HOSPITAL_COMMUNITY): Payer: Self-pay

## 2022-05-01 ENCOUNTER — Ambulatory Visit: Payer: Medicare HMO | Attending: Nurse Practitioner | Admitting: Nurse Practitioner

## 2022-05-01 ENCOUNTER — Encounter: Payer: Self-pay | Admitting: Nurse Practitioner

## 2022-05-01 VITALS — BP 110/72 | HR 82 | Ht 68.0 in | Wt 178.0 lb

## 2022-05-01 DIAGNOSIS — I351 Nonrheumatic aortic (valve) insufficiency: Secondary | ICD-10-CM | POA: Diagnosis not present

## 2022-05-01 DIAGNOSIS — Z8546 Personal history of malignant neoplasm of prostate: Secondary | ICD-10-CM | POA: Diagnosis not present

## 2022-05-01 DIAGNOSIS — R5383 Other fatigue: Secondary | ICD-10-CM

## 2022-05-01 DIAGNOSIS — I10 Essential (primary) hypertension: Secondary | ICD-10-CM | POA: Diagnosis not present

## 2022-05-01 LAB — CBC
Hematocrit: 37.3 % — ABNORMAL LOW (ref 37.5–51.0)
Hemoglobin: 12.3 g/dL — ABNORMAL LOW (ref 13.0–17.7)
MCH: 27.6 pg (ref 26.6–33.0)
MCHC: 33 g/dL (ref 31.5–35.7)
MCV: 84 fL (ref 79–97)
Platelets: 335 10*3/uL (ref 150–450)
RBC: 4.46 x10E6/uL (ref 4.14–5.80)
RDW: 15.1 % (ref 11.6–15.4)
WBC: 9.3 10*3/uL (ref 3.4–10.8)

## 2022-05-01 MED ORDER — AMLODIPINE BESYLATE 5 MG PO TABS: 5.0000 mg | ORAL_TABLET | Freq: Every day | ORAL | 2 refills | Status: DC

## 2022-05-01 NOTE — Telephone Encounter (Signed)
Called patient to see if he was interested in participating in the Cardiac Rehab Program. Patient stated yes. Patient will come in for orientation on 05/21/22 @ 8AM and will attend the 12:30PM exercise class.   Pensions consultant.

## 2022-05-01 NOTE — Progress Notes (Signed)
Patient is ready however due to prolonged wait list is not able to enroll at this time.

## 2022-05-01 NOTE — Patient Instructions (Addendum)
Medication Instructions:  DECREASE Norvasc to  Take 1 tablet once a day  *If you need a refill on your cardiac medications before your next appointment, please call your pharmacy*   Lab Work: [facility] If you have labs (blood work) drawn today and your tests are completely normal, you will receive your results only by: MyChart Message (if you have MyChart) OR A paper copy in the mail If you have any lab test that is abnormal or we need to change your treatment, we will call you to review the results.   Testing/Procedures: NONE ORDERED   Follow-Up: At [facility], you and your health needs are our priority.  As part of our continuing mission to provide you with exceptional heart care, we have created designated Provider Care Teams.  These Care Teams include your primary Cardiologist (physician) and Advanced Practice Providers (APPs -  Physician Assistants and Nurse Practitioners) who all work together to provide you with the care you need, when you need it.  We recommend signing up for the patient portal called "MyChart".  Sign up information is provided on this After Visit Summary.  MyChart is used to connect with patients for Virtual Visits (Telemedicine).  Patients are able to view lab/test results, encounter notes, upcoming appointments, etc.  Non-urgent messages can be sent to your provider as well.   To learn more about what you can do with MyChart, go to [email or web address].    Your next appointment:   3 month(s)  Provider:   Verne Carrow, MD  or Robin Searing, NP   Other Instructions

## 2022-05-05 DIAGNOSIS — H6123 Impacted cerumen, bilateral: Secondary | ICD-10-CM | POA: Diagnosis not present

## 2022-05-06 ENCOUNTER — Telehealth: Payer: Self-pay | Admitting: Cardiovascular Disease

## 2022-05-06 NOTE — Telephone Encounter (Signed)
Pt would like a call back regarding a Family and Medical Leave Act Form wanting to know if provider will fill out portion needed. Please advise.

## 2022-05-06 NOTE — Telephone Encounter (Signed)
I spoke w Mr. Worsely.  He is requesting Dr. Clifton James complete FMLA forms for his son who took time away from work during his open heart surgery.  Patient will drop off forms tomorrow.

## 2022-05-07 DIAGNOSIS — Z0279 Encounter for issue of other medical certificate: Secondary | ICD-10-CM

## 2022-05-14 ENCOUNTER — Telehealth: Payer: Self-pay | Admitting: Family Medicine

## 2022-05-14 NOTE — Telephone Encounter (Signed)
Called patient to schedule Medicare Annual Wellness Visit (AWV). Left message for patient to call back and schedule Medicare Annual Wellness Visit (AWV).  Last date of AWV: 04/11/21  Please schedule an appointment at any time with [facility] or hannah kim.  If any questions, please contact me at [phone].  Thank you ,  Daneil Beem [facility] AWV direct phone # [phone] 

## 2022-05-15 ENCOUNTER — Telehealth (HOSPITAL_COMMUNITY): Payer: Self-pay

## 2022-05-15 NOTE — Telephone Encounter (Signed)
Called and spoke with pt in regards to CR orientation time at 10:00AM. Patient verbalized understanding.

## 2022-05-16 ENCOUNTER — Telehealth: Payer: Self-pay | Admitting: Family Medicine

## 2022-05-16 NOTE — Telephone Encounter (Signed)
FMLA forms completed and returned to front desk for processing.

## 2022-05-16 NOTE — Telephone Encounter (Signed)
Contacted Avel Sensor to schedule their annual wellness visit. Appointment made for 05/27/22.  Rudell Cobb AWV direct phone # [phone]

## 2022-05-16 NOTE — Telephone Encounter (Signed)
Called patient to schedule Medicare Annual Wellness Visit (AWV). Left message for patient to call back and schedule Medicare Annual Wellness Visit (AWV).  Last date of AWV: 04/11/21  Please schedule an appointment at any time with [facility] or [employer].  If any questions, please contact me at [phone].  Thank you ,  Rudell Cobb AWV direct phone # [phone]

## 2022-05-19 ENCOUNTER — Telehealth (HOSPITAL_COMMUNITY): Payer: Self-pay

## 2022-05-19 NOTE — Telephone Encounter (Signed)
Completed FMLA forms scanned to chart. [name] notified and will be coming to pick them up today.

## 2022-05-19 NOTE — Telephone Encounter (Signed)
Called pt to confirm appt for 5/15 at 1000. Gave pt information regarding appt details, where to find, what to wear, taking medications, and eating breakfast. Health hx questionnaire completed. All questions from pt answered.   Jonna Coup, MS 05/19/2022 9:42 AM

## 2022-05-21 ENCOUNTER — Encounter (HOSPITAL_COMMUNITY)
Admission: RE | Admit: 2022-05-21 | Discharge: 2022-05-21 | Disposition: A | Discharge status: Home / Self Care | Payer: Medicare HMO | Source: Ambulatory Visit | Attending: Cardiovascular Disease | Admitting: Cardiovascular Disease

## 2022-05-21 VITALS — BP 120/82 | HR 61 | Ht 68.0 in | Wt 184.7 lb

## 2022-05-21 DIAGNOSIS — Z9889 Other specified postprocedural states: Secondary | ICD-10-CM | POA: Diagnosis not present

## 2022-05-21 NOTE — Progress Notes (Signed)
Cardiac Rehab Medication Review   Does the patient  feel that his/her medications are working for him/her?  YES   Has the patient been experiencing any side effects to the medications prescribed?  YES    Does the patient measure his/her own blood pressure or blood glucose at home?  YES   Does the patient have any problems obtaining medications due to transportation or finances?  NO  Understanding of regimen: excellent Understanding of indications: excellent Potential of compliance: excellent    Comments: Ah has a great understanding of his medications and regime. He checks his BP at home 1-2x a week. He has had some constipation since starting his medications and the stool softener has been helping with that.     Jonna Coup, MS 05/21/2022 1:07 PM

## 2022-05-21 NOTE — Progress Notes (Signed)
Cardiac Individual Treatment Plan  Patient Details  Name: Matthew [name] MRN: [id number] Date of Birth: 1950-04-29 Referring Provider:   Flowsheet Row INTENSIVE CARDIAC REHAB ORIENT from 05/21/2022 in [facility]  Referring Provider Verne Carrow, MD       Initial Encounter Date:  Flowsheet Row INTENSIVE CARDIAC REHAB ORIENT from 05/21/2022 in [facility]  Date 05/21/22       Visit Diagnosis: 03/10/22 S/P aortic valve replacement  Patient's Home Medications on Admission:  Current Outpatient Medications:    amLODipine (NORVASC) 5 MG tablet, Take 1 tablet (5 mg total) by mouth daily., Disp: 30 tablet, Rfl: 2   aspirin EC 325 MG tablet, Take 1 tablet (325 mg total) by mouth daily., Disp: , Rfl:    Docusate Sodium (STOOL SOFTENER) 100 MG capsule, Take 100 mg by mouth 2 (two) times daily., Disp: , Rfl:    metoprolol tartrate (LOPRESSOR) 25 MG tablet, Take 1 tablet (25 mg total) by mouth 2 (two) times daily., Disp: 60 tablet, Rfl: 3  Past Medical History: Past Medical History:  Diagnosis Date   Asthma    as child   Cancer (HCC)    prostate    Cataract    Dyspnea    exertional dyspnea   ED (erectile dysfunction)    Fracture    rt ankle   Headache    Hx Migraines   Heart murmur    Hemorrhoids    History of colonic polyps    History of prostate cancer    see's Dr. Brunilda Payor   Pneumonia    50+ years ago    Tobacco Use: Social History   Tobacco Use  Smoking Status Never  Smokeless Tobacco Never    Labs: Review Flowsheet  More data exists      Latest Ref Rng & Units 09/17/2021 02/13/2022 03/07/2022 03/10/2022 03/11/2022  Labs for ITP Cardiac and Pulmonary Rehab  Cholestrol 0 - 200 mg/dL 784  - - - -  LDL (calc) 0 - 99 mg/dL 88  - - - -  HDL-C >69.62 mg/dL 95.28  - - - -  Trlycerides 0.0 - 149.0 mg/dL 41.3  - - - -  Hemoglobin A1c 4.8 - 5.6 % 5.6  - 5.5   - -  PH, Arterial 7.35 - 7.45 - 7.445  7.44  7.391  7.332  7.296  7.413  7.382  7.340  7.380   PCO2 arterial 32 - 48 mmHg - 35.6  40  40.0  43.6  47.9  39.3  35.3  47.7  43.0   Bicarbonate 20.0 - 28.0 mmol/L - 28.3  24.4  27.2  24.1  23.4  23.7  25.0  21.0  25.8  25.1   TCO2 22 - 32 mmol/L - 30  25  - 25  25  25  28  26  26  22  22  26  28  27  26    Acid-base deficit 0.0 - 2.0 mmol/L - - - 1.0  3.0  3.0  4.0  -  O2 Saturation % - 67  98  99.6  99  98  95  100  100  100  98     Capillary Blood Glucose: Lab Results  Component Value Date   GLUCAP 113 (H) 03/15/2022   GLUCAP 134 (H) 03/14/2022   GLUCAP 103 (H) 03/14/2022   GLUCAP 98 03/14/2022  GLUCAP 97 03/14/2022     Exercise Target Goals: Exercise Program Goal: Individual exercise prescription set using results from initial 6 min walk test and THRR while considering  patient's activity barriers and safety.   Exercise Prescription Goal: Initial exercise prescription builds to 30-45 minutes a day of aerobic activity, 2-3 days per week.  Home exercise guidelines will be given to patient during program as part of exercise prescription that the participant will acknowledge.  Activity Barriers & Risk Stratification:  Activity Barriers & Cardiac Risk Stratification - 05/21/22 1300       Activity Barriers & Cardiac Risk Stratification   Activity Barriers Other (comment);Balance Concerns    Comments sternal precautions    Cardiac Risk Stratification High   <5 METs on            6 Minute Walk:  6 Minute Walk     Row Name 05/21/22 1259         6 Minute Walk   Phase Initial     Distance 2040 feet     Walk Time 6 minutes     # of Rest Breaks 0     MPH 3.86     METS 4.16     RPE 11     Perceived Dyspnea  0     VO2 Peak 14.6     Symptoms No     Resting HR 61 bpm     Resting BP 120/82     Resting Oxygen Saturation  98 %     Exercise Oxygen Saturation  during 6 min walk 98 %     Max Ex. HR 101 bpm     Max Ex. BP  136/80     2 Minute Post BP 110/78              Oxygen Initial Assessment:   Oxygen Re-Evaluation:   Oxygen Discharge (Final Oxygen Re-Evaluation):   Initial Exercise Prescription:  Initial Exercise Prescription - 05/21/22 1300       Date of Initial Exercise RX and Referring Provider   Date 05/21/22    Referring Provider Verne Carrow, MD    Expected Discharge Date 08/01/22      Bike   Level 3    Watts 40    Minutes 15    METs 3.5      Recumbant Elliptical   Level 3    RPM 65    Watts 95    Minutes 15    METs 3.5      Prescription Details   Frequency (times per week) 3    Duration Progress to 30 minutes of continuous aerobic without signs/symptoms of physical distress      Intensity   THRR 40-80% of Max Heartrate 59-119    Ratings of Perceived Exertion 11-13    Perceived Dyspnea 0-4      Progression   Progression Continue progressive overload as per policy without signs/symptoms or physical distress.      Resistance Training   Training Prescription Yes    Weight 4    Reps 10-15             Perform Capillary Blood Glucose checks as needed.  Exercise Prescription Changes:   Exercise Comments:   Exercise Goals and Review:   Exercise Goals     Row Name 05/21/22 1303             Exercise Goals   Increase Physical Activity Yes  Intervention Provide advice, education, support and counseling about physical activity/exercise needs.;Develop an individualized exercise prescription for aerobic and resistive training based on initial evaluation findings, risk stratification, comorbidities and participant's personal goals.       Expected Outcomes Short Term: Attend rehab on a regular basis to increase amount of physical activity.;Long Term: Exercising regularly at least 3-5 days a week.;Long Term: Add in home exercise to make exercise part of routine and to increase amount of physical activity.       Increase Strength and Stamina Yes        Intervention Provide advice, education, support and counseling about physical activity/exercise needs.;Develop an individualized exercise prescription for aerobic and resistive training based on initial evaluation findings, risk stratification, comorbidities and participant's personal goals.       Expected Outcomes Short Term: Increase workloads from initial exercise prescription for resistance, speed, and METs.;Short Term: Perform resistance training exercises routinely during rehab and add in resistance training at home;Long Term: Improve cardiorespiratory fitness, muscular endurance and strength as measured by increased METs and functional capacity ( )       Able to understand and use rate of perceived exertion (RPE) scale Yes       Intervention Provide education and explanation on how to use RPE scale       Expected Outcomes Short Term: Able to use RPE daily in rehab to express subjective intensity level;Long Term:  Able to use RPE to guide intensity level when exercising independently       Knowledge and understanding of Target Heart Rate Range (THRR) Yes       Intervention Provide education and explanation of THRR including how the numbers were predicted and where they are located for reference       Expected Outcomes Short Term: Able to use daily as guideline for intensity in rehab;Long Term: Able to use THRR to govern intensity when exercising independently;Short Term: Able to state/look up THRR       Understanding of Exercise Prescription Yes       Intervention Provide education, explanation, and written materials on patient's individual exercise prescription       Expected Outcomes Short Term: Able to explain program exercise prescription;Long Term: Able to explain home exercise prescription to exercise independently                Exercise Goals Re-Evaluation :   Discharge Exercise Prescription (Final Exercise Prescription Changes):   Nutrition:  Target Goals:  Understanding of nutrition guidelines, daily intake of sodium 1500mg , cholesterol 200mg , calories 30% from fat and 7% or less from saturated fats, daily to have 5 or more servings of fruits and vegetables.  Biometrics:  Pre Biometrics - 05/21/22 1257       Pre Biometrics   Waist Circumference 35.5 inches    Hip Circumference 42 inches    Waist to Hip Ratio 0.85 %    Triceps Skinfold 15 mm    % Body Fat 25.6 %    Grip Strength 48 kg    Flexibility 11.5 in    Single Leg Stand 4.68 seconds              Nutrition Therapy Plan and Nutrition Goals:   Nutrition Assessments:  MEDIFICTS Score Key: ?70 Need to make dietary changes  40-70 Heart Healthy Diet ? 40 Therapeutic Level Cholesterol Diet    Picture Your Plate Scores: <62 Unhealthy dietary pattern with much room for improvement. 41-50 Dietary pattern unlikely to meet recommendations for good health  and room for improvement. 51-60 More healthful dietary pattern, with some room for improvement.  >60 Healthy dietary pattern, although there may be some specific behaviors that could be improved.    Nutrition Goals Re-Evaluation:   Nutrition Goals Re-Evaluation:   Nutrition Goals Discharge (Final Nutrition Goals Re-Evaluation):   Psychosocial: Target Goals: Acknowledge presence or absence of significant depression and/or stress, maximize coping skills, provide positive support system. Participant is able to verbalize types and ability to use techniques and skills needed for reducing stress and depression.  Initial Review & Psychosocial Screening:  Initial Psych Review & Screening - 05/21/22 1303       Initial Review   Current issues with None Identified      Family Dynamics   Good Support System? Yes   Taegan has his wife and children for support     Barriers   Psychosocial barriers to participate in program There are no identifiable barriers or psychosocial needs.      Screening Interventions    Interventions Encouraged to exercise;Provide feedback about the scores to participant    Expected Outcomes Short Term goal: Identification and review with participant of any Quality of Life or Depression concerns found by scoring the questionnaire.;Long Term goal: The participant improves quality of Life and PHQ9 Scores as seen by post scores and/or verbalization of changes             Quality of Life Scores:  Quality of Life - 05/21/22 1304       Quality of Life   Select Quality of Life      Quality of Life Scores   Health/Function Pre 27.33 %    Socioeconomic Pre 29.29 %    Psych/Spiritual Pre 29.64 %    Family Pre 28.8 %    GLOBAL Pre 28.43 %            Scores of 19 and below usually indicate a poorer quality of life in these areas.  A difference of  2-3 points is a clinically meaningful difference.  A difference of 2-3 points in the total score of the Quality of Life Index has been associated with significant improvement in overall quality of life, self-image, physical symptoms, and general health in studies assessing change in quality of life.  PHQ-9: Review Flowsheet  More data exists      05/21/2022 10/15/2021 09/17/2021 04/11/2021 11/19/2020  Depression screen PHQ 2/9  Decreased Interest 0 0 0 0 0  Down, Depressed, Hopeless 0 0 0 0 0  PHQ - 2 Score 0 0 0 0 0  Altered sleeping 0 0 0 - 2  Tired, decreased energy 0 0 0 - 0  Change in appetite 0 0 0 - 0  Feeling bad or failure about yourself  0 0 0 - 1  Trouble concentrating 0 0 0 - 0  Moving slowly or fidgety/restless 0 0 0 - 0  Suicidal thoughts 0 0 0 - 0  PHQ-9 Score 0 0 0 - 3  Difficult doing work/chores - Not difficult at all Not difficult at all - Not difficult at all   Interpretation of Total Score  Total Score Depression Severity:  1-4 = Minimal depression, 5-9 = Mild depression, 10-14 = Moderate depression, 15-19 = Moderately severe depression, 20-27 = Severe depression   Psychosocial Evaluation and  Intervention:   Psychosocial Re-Evaluation:   Psychosocial Discharge (Final Psychosocial Re-Evaluation):   Vocational Rehabilitation: Provide vocational rehab assistance to qualifying candidates.   Vocational Rehab Evaluation & Intervention:  Vocational Rehab - 05/21/22 1306       Initial Vocational Rehab Evaluation & Intervention   Assessment shows need for Vocational Rehabilitation No   Kieon is retired            Education: Education Goals: Education classes will be provided on a weekly basis, covering required topics. Participant will state understanding/return demonstration of topics presented.     Core Videos: Exercise    Move It!  Clinical staff conducted group or individual video education with verbal and written material and guidebook.  Patient learns the recommended Pritikin exercise program. Exercise with the goal of living a long, healthy life. Some of the health benefits of exercise include controlled diabetes, healthier blood pressure levels, improved cholesterol levels, improved heart and lung capacity, improved sleep, and better body composition. Everyone should speak with their doctor before starting or changing an exercise routine.  Biomechanical Limitations Clinical staff conducted group or individual video education with verbal and written material and guidebook.  Patient learns how biomechanical limitations can impact exercise and how we can mitigate and possibly overcome limitations to have an impactful and balanced exercise routine.  Body Composition Clinical staff conducted group or individual video education with verbal and written material and guidebook.  Patient learns that body composition (ratio of muscle mass to fat mass) is a key component to assessing overall fitness, rather than body weight alone. Increased fat mass, especially visceral belly fat, can put [place] at increased risk for metabolic syndrome, type 2 diabetes, heart disease, and even  death. It is recommended to combine diet and exercise (cardiovascular and resistance training) to improve your body composition. Seek guidance from your physician and exercise physiologist before implementing an exercise routine.  Exercise Action Plan Clinical staff conducted group or individual video education with verbal and written material and guidebook.  Patient learns the recommended strategies to achieve and enjoy long-term exercise adherence, including variety, self-motivation, self-efficacy, and positive decision making. Benefits of exercise include fitness, good health, weight management, more energy, better sleep, less stress, and overall well-being.  Medical   Heart Disease Risk Reduction Clinical staff conducted group or individual video education with verbal and written material and guidebook.  Patient learns our heart is our most vital organ as it circulates oxygen, nutrients, white blood cells, and hormones throughout the entire body, and carries waste away. Data supports a plant-based eating plan like the Pritikin Program for its effectiveness in slowing progression of and reversing heart disease. The video provides a number of recommendations to address heart disease.   Metabolic Syndrome and Belly Fat  Clinical staff conducted group or individual video education with verbal and written material and guidebook.  Patient learns what metabolic syndrome is, how it leads to heart disease, and how one can reverse it and keep it from coming back. You have metabolic syndrome if you have 3 of the following 5 criteria: abdominal obesity, high blood pressure, high triglycerides, low HDL cholesterol, and high blood sugar.  Hypertension and Heart Disease Clinical staff conducted group or individual video education with verbal and written material and guidebook.  Patient learns that high blood pressure, or hypertension, is very common in the [place]. Hypertension is largely due to  excessive salt intake, but other important risk factors include being overweight, physical inactivity, drinking too much alcohol, smoking, and not eating enough potassium from fruits and vegetables. High blood pressure is a leading risk factor for heart attack, stroke, congestive heart failure, dementia, kidney failure, and premature  death. Long-term effects of excessive salt intake include stiffening of the arteries and thickening of heart muscle and organ damage. Recommendations include ways to reduce hypertension and the risk of heart disease.  Diseases of Our Time - Focusing on Diabetes Clinical staff conducted group or individual video education with verbal and written material and guidebook.  Patient learns why the best way to stop diseases of our time is prevention, through food and other lifestyle changes. Medicine (such as prescription pills and surgeries) is often only a Band-Aid on the problem, not a long-term solution. Most common diseases of our time include obesity, type 2 diabetes, hypertension, heart disease, and cancer. The Pritikin Program is recommended and has been proven to help reduce, reverse, and/or prevent the damaging effects of metabolic syndrome.  Nutrition   Overview of the Pritikin Eating Plan  Clinical staff conducted group or individual video education with verbal and written material and guidebook.  Patient learns about the Pritikin Eating Plan for disease risk reduction. The Pritikin Eating Plan emphasizes a wide variety of unrefined, minimally-processed carbohydrates, like fruits, vegetables, whole grains, and legumes. Go, Caution, and Stop food choices are explained. Plant-based and lean animal proteins are emphasized. Rationale provided for low sodium intake for blood pressure control, low added sugars for blood sugar stabilization, and low added fats and oils for coronary artery disease risk reduction and weight management.  Calorie Density  Clinical staff conducted  group or individual video education with verbal and written material and guidebook.  Patient learns about calorie density and how it impacts the Pritikin Eating Plan. Knowing the characteristics of the food you choose will help you decide whether those foods will lead to weight gain or weight loss, and whether you want to consume more or less of them. Weight loss is usually a side effect of the Pritikin Eating Plan because of its focus on low calorie-dense foods.  Label Reading  Clinical staff conducted group or individual video education with verbal and written material and guidebook.  Patient learns about the Pritikin recommended label reading guidelines and corresponding recommendations regarding calorie density, added sugars, sodium content, and whole grains.  Dining Out - Part 1  Clinical staff conducted group or individual video education with verbal and written material and guidebook.  Patient learns that restaurant meals can be sabotaging because they can be so high in calories, fat, sodium, and/or sugar. Patient learns recommended strategies on how to positively address this and avoid unhealthy pitfalls.  Facts on Fats  Clinical staff conducted group or individual video education with verbal and written material and guidebook.  Patient learns that lifestyle modifications can be just as effective, if not more so, as many medications for lowering your risk of heart disease. A Pritikin lifestyle can help to reduce your risk of inflammation and atherosclerosis (cholesterol build-up, or plaque, in the artery walls). Lifestyle interventions such as dietary choices and physical activity address the cause of atherosclerosis. A review of the types of fats and their impact on blood cholesterol levels, along with dietary recommendations to reduce fat intake is also included.  Nutrition Action Plan  Clinical staff conducted group or individual video education with verbal and written material and  guidebook.  Patient learns how to incorporate Pritikin recommendations into their lifestyle. Recommendations include planning and keeping personal health goals in mind as an important part of their success.  Healthy Mind-Set    Healthy Minds, Bodies, Hearts  Clinical staff conducted group or individual video education with verbal and  written material and guidebook.  Patient learns how to identify when they are stressed. Video will discuss the impact of that stress, as well as the many benefits of stress management. Patient will also be introduced to stress management techniques. The way we think, act, and feel has an impact on our hearts.  How Our Thoughts Can Heal Our Hearts  Clinical staff conducted group or individual video education with verbal and written material and guidebook.  Patient learns that negative thoughts can cause depression and anxiety. This can result in negative lifestyle behavior and serious health problems. Cognitive behavioral therapy is an effective method to help control our thoughts in order to change and improve our emotional outlook.  Additional Videos:  Exercise    Improving Performance  Clinical staff conducted group or individual video education with verbal and written material and guidebook.  Patient learns to use a non-linear approach by alternating intensity levels and lengths of time spent exercising to help burn more calories and lose more body fat. Cardiovascular exercise helps improve heart health, metabolism, hormonal balance, blood sugar control, and recovery from fatigue. Resistance training improves strength, endurance, balance, coordination, reaction time, metabolism, and muscle mass. Flexibility exercise improves circulation, posture, and balance. Seek guidance from your physician and exercise physiologist before implementing an exercise routine and learn your capabilities and proper form for all exercise.  Introduction to Yoga  Clinical staff  conducted group or individual video education with verbal and written material and guidebook.  Patient learns about yoga, a discipline of the coming together of mind, breath, and body. The benefits of yoga include improved flexibility, improved range of motion, better posture and core strength, increased lung function, weight loss, and positive self-image. Yoga's heart health benefits include lowered blood pressure, healthier heart rate, decreased cholesterol and triglyceride levels, improved immune function, and reduced stress. Seek guidance from your physician and exercise physiologist before implementing an exercise routine and learn your capabilities and proper form for all exercise.  Medical   Aging: Enhancing Your Quality of Life  Clinical staff conducted group or individual video education with verbal and written material and guidebook.  Patient learns key strategies and recommendations to stay in good physical health and enhance quality of life, such as prevention strategies, having an advocate, securing a Health Care Proxy and Power of Attorney, and keeping a list of medications and system for tracking them. It also discusses how to avoid risk for bone loss.  Biology of Weight Control  Clinical staff conducted group or individual video education with verbal and written material and guidebook.  Patient learns that weight gain occurs because we consume more calories than we burn (eating more, moving less). Even if your body weight is normal, you may have higher ratios of fat compared to muscle mass. Too much body fat puts you at increased risk for cardiovascular disease, heart attack, stroke, type 2 diabetes, and obesity-related cancers. In addition to exercise, following the Pritikin Eating Plan can help reduce your risk.  Decoding Lab Results  Clinical staff conducted group or individual video education with verbal and written material and guidebook.  Patient learns that lab test reflects one  measurement whose values change over time and are influenced by many factors, including medication, stress, sleep, exercise, food, hydration, pre-existing medical conditions, and more. It is recommended to use the knowledge from this video to become more involved with your lab results and evaluate your numbers to speak with your doctor.   Diseases of Our Time -  Overview  Clinical staff conducted group or individual video education with verbal and written material and guidebook.  Patient learns that according to the CDC, 50% to 70% of chronic diseases (such as obesity, type 2 diabetes, elevated lipids, hypertension, and heart disease) are avoidable through lifestyle improvements including healthier food choices, listening to satiety cues, and increased physical activity.  Sleep Disorders Clinical staff conducted group or individual video education with verbal and written material and guidebook.  Patient learns how good quality and duration of sleep are important to overall health and well-being. Patient also learns about sleep disorders and how they impact health along with recommendations to address them, including discussing with a physician.  Nutrition  Dining Out - Part 2 Clinical staff conducted group or individual video education with verbal and written material and guidebook.  Patient learns how to plan ahead and communicate in order to maximize their dining experience in a healthy and nutritious manner. Included are recommended food choices based on the type of restaurant the patient is visiting.   Fueling a Banker conducted group or individual video education with verbal and written material and guidebook.  There is a strong connection between our food choices and our health. Diseases like obesity and type 2 diabetes are very prevalent and are in large-part due to lifestyle choices. The Pritikin Eating Plan provides plenty of food and hunger-curbing satisfaction. It is  easy to follow, affordable, and helps reduce health risks.  Menu Workshop  Clinical staff conducted group or individual video education with verbal and written material and guidebook.  Patient learns that restaurant meals can sabotage health goals because they are often packed with calories, fat, sodium, and sugar. Recommendations include strategies to plan ahead and to communicate with the manager, chef, or server to help order a healthier meal.  Planning Your Eating Strategy  Clinical staff conducted group or individual video education with verbal and written material and guidebook.  Patient learns about the Pritikin Eating Plan and its benefit of reducing the risk of disease. The Pritikin Eating Plan does not focus on calories. Instead, it emphasizes high-quality, nutrient-rich foods. By knowing the characteristics of the foods, we choose, we can determine their calorie density and make informed decisions.  Targeting Your Nutrition Priorities  Clinical staff conducted group or individual video education with verbal and written material and guidebook.  Patient learns that lifestyle habits have a tremendous impact on disease risk and progression. This video provides eating and physical activity recommendations based on your personal health goals, such as reducing LDL cholesterol, losing weight, preventing or controlling type 2 diabetes, and reducing high blood pressure.  Vitamins and Minerals  Clinical staff conducted group or individual video education with verbal and written material and guidebook.  Patient learns different ways to obtain key vitamins and minerals, including through a recommended healthy diet. It is important to discuss all supplements you take with your doctor.   Healthy Mind-Set    Smoking Cessation  Clinical staff conducted group or individual video education with verbal and written material and guidebook.  Patient learns that cigarette smoking and tobacco addiction pose  a serious health risk which affects millions of people. Stopping smoking will significantly reduce the risk of heart disease, lung disease, and many forms of cancer. Recommended strategies for quitting are covered, including working with your doctor to develop a successful plan.  Culinary   Becoming a Set designer conducted group or individual video education with verbal  and written material and guidebook.  Patient learns that cooking at home can be healthy, cost-effective, quick, and puts them in control. Keys to cooking healthy recipes will include looking at your recipe, assessing your equipment needs, planning ahead, making it simple, choosing cost-effective seasonal ingredients, and limiting the use of added fats, salts, and sugars.  Cooking - Breakfast and Snacks  Clinical staff conducted group or individual video education with verbal and written material and guidebook.  Patient learns how important breakfast is to satiety and nutrition through the entire day. Recommendations include key foods to eat during breakfast to help stabilize blood sugar levels and to prevent overeating at meals later in the day. Planning ahead is also a key component.  Cooking - Educational psychologist conducted group or individual video education with verbal and written material and guidebook.  Patient learns eating strategies to improve overall health, including an approach to cook more at home. Recommendations include thinking of animal protein as a side on your plate rather than center stage and focusing instead on lower calorie dense options like vegetables, fruits, whole grains, and plant-based proteins, such as beans. Making sauces in large quantities to freeze for later and leaving the skin on your vegetables are also recommended to maximize your experience.  Cooking - Healthy Salads and Dressing Clinical staff conducted group or individual video education with verbal and written  material and guidebook.  Patient learns that vegetables, fruits, whole grains, and legumes are the foundations of the Pritikin Eating Plan. Recommendations include how to incorporate each of these in flavorful and healthy salads, and how to create homemade salad dressings. Proper handling of ingredients is also covered. Cooking - Soups and [employer] - Soups and Desserts Clinical staff conducted group or individual video education with verbal and written material and guidebook.  Patient learns that Pritikin soups and desserts make for easy, nutritious, and delicious snacks and meal components that are low in sodium, fat, sugar, and calorie density, while high in vitamins, minerals, and filling fiber. Recommendations include simple and healthy ideas for soups and desserts.   Overview     The Pritikin Solution Program Overview Clinical staff conducted group or individual video education with verbal and written material and guidebook.  Patient learns that the results of the Pritikin Program have been documented in more than 100 articles published in peer-reviewed journals, and the benefits include reducing risk factors for (and, in some cases, even reversing) high cholesterol, high blood pressure, type 2 diabetes, obesity, and more! An overview of the three key pillars of the Pritikin Program will be covered: eating well, doing regular exercise, and having a healthy mind-set.  WORKSHOPS  Exercise: Exercise Basics: Building Your Action Plan Clinical staff led group instruction and group discussion with PowerPoint presentation and patient guidebook. To enhance the learning environment the use of posters, models and videos may be added. At the conclusion of this workshop, patients will comprehend the difference between physical activity and exercise, as well as the benefits of incorporating both, into their routine. Patients will understand the FITT (Frequency, Intensity, Time, and Type) principle  and how to use it to build an exercise action plan. In addition, safety concerns and other considerations for exercise and cardiac rehab will be addressed by the presenter. The purpose of this lesson is to promote a comprehensive and effective weekly exercise routine in order to improve patients' overall level of fitness.   Managing Heart Disease: Your Path to a Healthier  Heart Clinical staff led group instruction and group discussion with PowerPoint presentation and patient guidebook. To enhance the learning environment the use of posters, models and videos may be added.At the conclusion of this workshop, patients will understand the anatomy and physiology of the heart. Additionally, they will understand how Pritikin's three pillars impact the risk factors, the progression, and the management of heart disease.  The purpose of this lesson is to provide a high-level overview of the heart, heart disease, and how the Pritikin lifestyle positively impacts risk factors.  Exercise Biomechanics Clinical staff led group instruction and group discussion with PowerPoint presentation and patient guidebook. To enhance the learning environment the use of posters, models and videos may be added. Patients will learn how the structural parts of their bodies function and how these functions impact their daily activities, movement, and exercise. Patients will learn how to promote a neutral spine, learn how to manage pain, and identify ways to improve their physical movement in order to promote healthy living. The purpose of this lesson is to expose patients to common physical limitations that impact physical activity. Participants will learn practical ways to adapt and manage aches and pains, and to minimize their effect on regular exercise. Patients will learn how to maintain good posture while sitting, walking, and lifting.  Balance Training and Fall Prevention  Clinical staff led group instruction and group  discussion with PowerPoint presentation and patient guidebook. To enhance the learning environment the use of posters, models and videos may be added. At the conclusion of this workshop, patients will understand the importance of their sensorimotor skills (vision, proprioception, and the vestibular system) in maintaining their ability to balance as they age. Patients will apply a variety of balancing exercises that are appropriate for their current level of function. Patients will understand the common causes for poor balance, possible solutions to these problems, and ways to modify their physical environment in order to minimize their fall risk. The purpose of this lesson is to teach patients about the importance of maintaining balance as they age and ways to minimize their risk of falling.  WORKSHOPS   Nutrition:  Fueling a Ship broker led group instruction and group discussion with PowerPoint presentation and patient guidebook. To enhance the learning environment the use of posters, models and videos may be added. Patients will review the foundational principles of the Pritikin Eating Plan and understand what constitutes a serving size in each of the food groups. Patients will also learn Pritikin-friendly foods that are better choices when away from home and review make-ahead meal and snack options. Calorie density will be reviewed and applied to three nutrition priorities: weight maintenance, weight loss, and weight gain. The purpose of this lesson is to reinforce (in a group setting) the key concepts around what patients are recommended to eat and how to apply these guidelines when away from home by planning and selecting Pritikin-friendly options. Patients will understand how calorie density may be adjusted for different weight management goals.  Mindful Eating  Clinical staff led group instruction and group discussion with PowerPoint presentation and patient guidebook. To enhance  the learning environment the use of posters, models and videos may be added. Patients will briefly review the concepts of the Pritikin Eating Plan and the importance of low-calorie dense foods. The concept of mindful eating will be introduced as well as the importance of paying attention to internal hunger signals. Triggers for non-hunger eating and techniques for dealing with triggers will be explored.  The purpose of this lesson is to provide patients with the opportunity to review the basic principles of the [place], discuss the value of eating mindfully and how to measure internal cues of hunger and fullness using the Hunger Scale. Patients will also discuss reasons for non-hunger eating and learn strategies to use for controlling emotional eating.  Targeting Your Nutrition Priorities Clinical staff led group instruction and group discussion with PowerPoint presentation and patient guidebook. To enhance the learning environment the use of posters, models and videos may be added. Patients will learn how to determine their genetic susceptibility to disease by reviewing their family history. Patients will gain insight into the importance of diet as part of an overall healthy lifestyle in mitigating the impact of genetics and other environmental insults. The purpose of this lesson is to provide patients with the opportunity to assess their personal nutrition priorities by looking at their family history, their own health history and current risk factors. Patients will also be able to discuss ways of prioritizing and modifying the [place] for their highest risk areas  Menu  Clinical staff led group instruction and group discussion with PowerPoint presentation and patient guidebook. To enhance the learning environment the use of posters, models and videos may be added. Using menus brought in from [employer], or printed from [employer], patients will apply the [place] dining out  guidelines that were presented in the [employer] video. Patients will also be able to practice these guidelines in a variety of provided scenarios. The purpose of this lesson is to provide patients with the opportunity to practice hands-on learning of the [place] with actual menus and practice scenarios.  Label Reading Clinical staff led group instruction and group discussion with PowerPoint presentation and patient guidebook. To enhance the learning environment the use of posters, models and videos may be added. Patients will review and discuss the Pritikin label reading guidelines presented in Pritikin's Label Reading Educational series video. Using fool labels brought in from local grocery stores and markets, patients will apply the label reading guidelines and determine if the packaged food meet the Pritikin guidelines. The purpose of this lesson is to provide patients with the opportunity to review, discuss, and practice hands-on learning of the Pritikin Label Reading guidelines with actual packaged food labels. [place] Workshops are designed to teach patients ways to prepare quick, simple, and affordable recipes at home. The importance of nutrition's role in chronic disease risk reduction is reflected in its emphasis in the overall Pritikin program. By learning how to prepare essential core Pritikin Eating Plan recipes, patients will increase control over what they eat; be able to customize the flavor of foods without the use of added salt, sugar, or fat; and improve the quality of the food they consume. By learning a set of core recipes which are easily assembled, quickly prepared, and affordable, patients are more likely to prepare more healthy foods at home. These workshops focus on convenient breakfasts, simple entres, side dishes, and desserts which can be prepared with minimal effort and are consistent with nutrition  recommendations for cardiovascular risk reduction. Cooking [employer] are taught by a Engineer, materials (RD) who has been trained by the [employer]. The chef or RD has a clear understanding of the importance of minimizing - if not completely eliminating - added fat, sugar, and sodium in recipes. Throughout the series of [employer]  sessions, patients will learn about healthy ingredients and efficient methods of cooking to build confidence in their capability to prepare    Cooking School weekly topics:  Adding Flavor- Sodium-Free  Fast and Healthy Breakfasts  Powerhouse Plant-Based Proteins  Satisfying Salads and Dressings  Simple Sides and Sauces  International Cuisine-Spotlight on the Blue Zones  Delicious Desserts  Savory Soups  Efficiency Cooking - Meals in a Snap  Tasty Appetizers and Snacks  Comforting Weekend Breakfasts  One-Pot Wonders   Fast Evening Meals  Landscape architect Your Pritikin Plate  WORKSHOPS   Healthy Mindset (Psychosocial):  Focused Goals, Sustainable Changes Clinical staff led group instruction and group discussion with PowerPoint presentation and patient guidebook. To enhance the learning environment the use of posters, models and videos may be added. Patients will be able to apply effective goal setting strategies to establish at least one personal goal, and then take consistent, meaningful action toward that goal. They will learn to identify common barriers to achieving personal goals and develop strategies to overcome them. Patients will also gain an understanding of how our mind-set can impact our ability to achieve goals and the importance of cultivating a positive and growth-oriented mind-set. The purpose of this lesson is to provide patients with a deeper understanding of how to set and achieve personal goals, as well as the tools and strategies needed to overcome common obstacles which may arise along  the way.  From Head to Heart: The Power of a Healthy Outlook  Clinical staff led group instruction and group discussion with PowerPoint presentation and patient guidebook. To enhance the learning environment the use of posters, models and videos may be added. Patients will be able to recognize and describe the impact of emotions and mood on physical health. They will discover the importance of self-care and explore self-care practices which may work for them. Patients will also learn how to utilize the 4 C's to cultivate a healthier outlook and better manage stress and challenges. The purpose of this lesson is to demonstrate to patients how a healthy outlook is an essential part of maintaining good health, especially as they continue their cardiac rehab journey.  Healthy Sleep for a Healthy Heart Clinical staff led group instruction and group discussion with PowerPoint presentation and patient guidebook. To enhance the learning environment the use of posters, models and videos may be added. At the conclusion of this workshop, patients will be able to demonstrate knowledge of the importance of sleep to overall health, well-being, and quality of life. They will understand the symptoms of, and treatments for, common sleep disorders. Patients will also be able to identify daytime and nighttime behaviors which impact sleep, and they will be able to apply these tools to help manage sleep-related challenges. The purpose of this lesson is to provide patients with a general overview of sleep and outline the importance of quality sleep. Patients will learn about a few of the most common sleep disorders. Patients will also be introduced to the concept of "sleep hygiene," and discover ways to self-manage certain sleeping problems through simple daily behavior changes. Finally, the workshop will motivate patients by clarifying the links between quality sleep and their goals of heart-healthy living.   Recognizing and  Reducing Stress Clinical staff led group instruction and group discussion with PowerPoint presentation and patient guidebook. To enhance the learning environment the use of posters, models and videos may be added. At the conclusion of this workshop, patients will be able to understand the  types of stress reactions, differentiate between acute and chronic stress, and recognize the impact that chronic stress has on their health. They will also be able to apply different coping mechanisms, such as reframing negative self-talk. Patients will have the opportunity to practice a variety of stress management techniques, such as deep abdominal breathing, progressive muscle relaxation, and/or guided imagery.  The purpose of this lesson is to educate patients on the role of stress in their lives and to provide healthy techniques for coping with it.  Learning Barriers/Preferences:  Learning Barriers/Preferences - 05/21/22 1304       Learning Barriers/Preferences   Learning Barriers None    Learning Preferences Computer/Internet;Group Instruction;Individual Instruction;Pictoral;Skilled Demonstration;Verbal Instruction;Written Material             Education Topics:  Knowledge Questionnaire Score:  Knowledge Questionnaire Score - 05/21/22 1305       Knowledge Questionnaire Score   Pre Score 22/24             Core Components/Risk Factors/Patient Goals at Admission:  Personal Goals and Risk Factors at Admission - 05/21/22 1306       Core Components/Risk Factors/Patient Goals on Admission    Weight Management Yes;Weight Maintenance    Intervention Weight Management: Develop a combined nutrition and exercise program designed to reach desired caloric intake, while maintaining appropriate intake of nutrient and fiber, sodium and fats, and appropriate energy expenditure required for the weight goal.;Weight Management: Provide education and appropriate resources to help participant work on and attain  dietary goals.    Expected Outcomes Short Term: Continue to assess and modify interventions until short term weight is achieved;Long Term: Adherence to nutrition and physical activity/exercise program aimed toward attainment of established weight goal;Understanding recommendations for meals to include 15-35% energy as protein, 25-35% energy from fat, 35-60% energy from carbohydrates, less than 200mg  of dietary cholesterol, 20-35 gm of total fiber daily;Understanding of distribution of calorie intake throughout the day with the consumption of 4-5 meals/snacks;Weight Maintenance: Understanding of the daily nutrition guidelines, which includes 25-35% calories from fat, 7% or less cal from saturated fats, less than 200mg  cholesterol, less than 1.5gm of sodium, & 5 or more servings of fruits and vegetables daily    Hypertension Yes    Intervention Provide education on lifestyle modifcations including regular physical activity/exercise, weight management, moderate sodium restriction and increased consumption of fresh fruit, vegetables, and low fat dairy, alcohol moderation, and smoking cessation.;Monitor prescription use compliance.    Expected Outcomes Short Term: Continued assessment and intervention until BP is < 140/56mm HG in hypertensive participants. < 130/64mm HG in hypertensive participants with diabetes, heart failure or chronic kidney disease.;Long Term: Maintenance of blood pressure at goal levels.             Core Components/Risk Factors/Patient Goals Review:    Core Components/Risk Factors/Patient Goals at Discharge (Final Review):    ITP Comments:  ITP Comments     Row Name 05/21/22 1018           ITP Comments Dr. Armanda Magic medical director. Introduction to pritikin education/intensive cardiac rehab. Initial orientation packet reviewed with patient.                Comments: Participant attended orientation for the cardiac rehabilitation program on  05/21/2022  to perform  initial intake and exercise walk test. Patient introduced to the Pritikin Program education and orientation packet was reviewed. Completed 6-minute walk test, measurements, initial ITP, and exercise prescription. Vital signs stable. Telemetry-normal sinus rhythm,  asymptomatic.   Service time was from 0930 to 1134.  Jonna Coup, MS 05/21/2022 1:10 PM

## 2022-05-26 ENCOUNTER — Encounter (HOSPITAL_COMMUNITY)
Admission: RE | Admit: 2022-05-26 | Discharge: 2022-05-26 | Disposition: A | Discharge status: Home / Self Care | Payer: Medicare HMO | Source: Ambulatory Visit | Attending: Cardiovascular Disease | Admitting: Cardiovascular Disease

## 2022-05-26 DIAGNOSIS — Z9889 Other specified postprocedural states: Secondary | ICD-10-CM | POA: Diagnosis not present

## 2022-05-26 NOTE — Progress Notes (Signed)
Pt in cardiac rehab today. Pt tolerated light exercise without difficulty. VSS, telemetry- NSR asymptomatic. Medication list reconciled. Pt denies barriers to medicaiton compliance. PSYCHOSOCIAL ASSESSMENT:  PHQ 2-9/PHQ-9 scores 0/0. Pt exhibits positive coping skills, hopeful outlook with supportive family. No psychosocial needs identified at this time, no psychosocial interventions necessary. Pt enjoys spending time with family and reading.  Pt oriented to exercise equipment and routine. Understanding verbalized.

## 2022-05-27 ENCOUNTER — Ambulatory Visit (INDEPENDENT_AMBULATORY_CARE_PROVIDER_SITE_OTHER): Payer: Medicare HMO | Admitting: Family Medicine

## 2022-05-27 NOTE — Progress Notes (Signed)
Patient requested to reschedule on checkin per staff.

## 2022-05-28 ENCOUNTER — Encounter (HOSPITAL_COMMUNITY)
Admission: RE | Admit: 2022-05-28 | Discharge: 2022-05-28 | Disposition: A | Discharge status: Home / Self Care | Payer: Medicare HMO | Source: Ambulatory Visit | Attending: Cardiovascular Disease | Admitting: Cardiovascular Disease

## 2022-05-28 DIAGNOSIS — Z9889 Other specified postprocedural states: Secondary | ICD-10-CM

## 2022-05-30 ENCOUNTER — Encounter (HOSPITAL_COMMUNITY)
Admission: RE | Admit: 2022-05-30 | Discharge: 2022-05-30 | Disposition: A | Discharge status: Home / Self Care | Payer: Medicare HMO | Source: Ambulatory Visit | Attending: Cardiovascular Disease | Admitting: Cardiovascular Disease

## 2022-05-30 DIAGNOSIS — Z9889 Other specified postprocedural states: Secondary | ICD-10-CM | POA: Diagnosis not present

## 2022-06-04 ENCOUNTER — Encounter (HOSPITAL_COMMUNITY)
Admission: RE | Admit: 2022-06-04 | Discharge: 2022-06-04 | Disposition: A | Discharge status: Home / Self Care | Payer: Medicare HMO | Source: Ambulatory Visit | Attending: Cardiovascular Disease | Admitting: Cardiovascular Disease

## 2022-06-04 ENCOUNTER — Ambulatory Visit (INDEPENDENT_AMBULATORY_CARE_PROVIDER_SITE_OTHER): Payer: Medicare HMO

## 2022-06-04 VITALS — Ht 68.0 in | Wt 178.0 lb

## 2022-06-04 DIAGNOSIS — Z9889 Other specified postprocedural states: Secondary | ICD-10-CM | POA: Diagnosis not present

## 2022-06-04 DIAGNOSIS — Z0000 Encounter for general adult medical examination without abnormal findings: Secondary | ICD-10-CM | POA: Diagnosis not present

## 2022-06-04 NOTE — Patient Instructions (Addendum)
[name] , Thank you for taking time to come for your Medicare Wellness Visit. I appreciate your ongoing commitment to your health goals. Please review the following plan we discussed and let me know if I can assist you in the future.   These are the goals we discussed:  Goals       Increase physical activity (pt-stated)      I would like to lose about 15 lb      Increase physical activity (pt-stated)        This is a list of the screening recommended for you and due dates:  Health Maintenance  Topic Date Due   Zoster (Shingles) Vaccine (1 of 2) 06/25/2022*   COVID-19 Vaccine (3 - Pfizer risk series) 10/06/2022*   Hepatitis C Screening  06/04/2023*   Flu Shot  08/07/2022   Medicare Annual Wellness Visit  06/04/2023   Colon Cancer Screening  11/17/2025   DTaP/Tdap/Td vaccine (3 - Td or Tdap) 12/24/2028   Pneumonia Vaccine  Completed   HPV Vaccine  Aged Out  *Topic was postponed. The date shown is not the original due date.    Advanced directives: Please bring a copy of your health care power of attorney and living will to the office to be added to your chart at your convenience.   Conditions/risks identified: None  Next appointment: Follow up in one year for your annual wellness visit.    Preventive Care 30 Years and Older, Male  Preventive care refers to lifestyle choices and visits with your health care provider that can promote health and wellness. What does preventive care include? A yearly physical exam. This is also called an annual well check. Dental exams once or twice a year. Routine eye exams. Ask your health care provider how often you should have your eyes checked. Personal lifestyle choices, including: Daily care of your teeth and gums. Regular physical activity. Eating a healthy diet. Avoiding tobacco and drug use. Limiting alcohol use. Practicing safe sex. Taking low doses of aspirin every day. Taking vitamin and mineral supplements as recommended by  your health care provider. What happens during an annual well check? The services and screenings done by your health care provider during your annual well check will depend on your age, overall health, lifestyle risk factors, and family history of disease. Counseling  Your health care provider may ask you questions about your: Alcohol use. Tobacco use. Drug use. Emotional well-being. Home and relationship well-being. Sexual activity. Eating habits. History of falls. Memory and ability to understand (cognition). Work and work Astronomer. Screening  You may have the following tests or measurements: Height, weight, and BMI. Blood pressure. Lipid and cholesterol levels. These may be checked every 5 years, or more frequently if you are over 33 years old. Skin check. Lung cancer screening. You may have this screening every year starting at age 41 if you have a 30-pack-year history of smoking and currently smoke or have quit within the past 15 years. Fecal occult blood test (FOBT) of the stool. You may have this test every year starting at age 1. Flexible sigmoidoscopy or colonoscopy. You may have a sigmoidoscopy every 5 years or a colonoscopy every 10 years starting at age 13. Prostate cancer screening. Recommendations will vary depending on your family history and other risks. Hepatitis C blood test. Hepatitis B blood test. Sexually transmitted disease (STD) testing. Diabetes screening. This is done by checking your blood sugar (glucose) after you have not eaten for a while (  fasting). You may have this done every 1-3 years. Abdominal aortic aneurysm (AAA) screening. You may need this if you are a current or former smoker. Osteoporosis. You may be screened starting at age 5 if you are at high risk. Talk with your health care provider about your test results, treatment options, and if necessary, the need for more tests. Vaccines  Your health care provider may recommend certain vaccines,  such as: Influenza vaccine. This is recommended every year. Tetanus, diphtheria, and acellular pertussis (Tdap, Td) vaccine. You may need a Td booster every 10 years. Zoster vaccine. You may need this after age 71. Pneumococcal 13-valent conjugate (PCV13) vaccine. One dose is recommended after age 64. Pneumococcal polysaccharide (PPSV23) vaccine. One dose is recommended after age 5. Talk to your health care provider about which screenings and vaccines you need and how often you need them. This information is not intended to replace advice given to you by your health care provider. Make sure you discuss any questions you have with your health care provider. Document Released: 01/19/2015 Document Revised: 09/12/2015 Document Reviewed: 10/24/2014 Elsevier Interactive Patient Education  2017 [employer].  Fall Prevention in the Home Falls can cause injuries. They can happen to people of all ages. There are many things you can do to make your home safe and to help prevent falls. What can I do on the outside of my home? Regularly fix the edges of walkways and driveways and fix any cracks. Remove anything that might make you trip as you walk through a door, such as a raised step or threshold. Trim any bushes or trees on the path to your home. Use bright outdoor lighting. Clear any walking paths of anything that might make someone trip, such as rocks or tools. Regularly check to see if handrails are loose or broken. Make sure that both sides of any steps have handrails. Any raised decks and porches should have guardrails on the edges. Have any leaves, snow, or ice cleared regularly. Use sand or salt on walking paths during winter. Clean up any spills in your garage right away. This includes oil or grease spills. What can I do in the bathroom? Use night lights. Install grab bars by the toilet and in the tub and shower. Do not use towel bars as grab bars. Use non-skid mats or decals in the tub or  shower. If you need to sit down in the shower, use a plastic, non-slip stool. Keep the floor dry. Clean up any water that spills on the floor as soon as it happens. Remove soap buildup in the tub or shower regularly. Attach bath mats securely with double-sided non-slip rug tape. Do not have throw rugs and other things on the floor that can make you trip. What can I do in the bedroom? Use night lights. Make sure that you have a light by your bed that is easy to reach. Do not use any sheets or blankets that are too big for your bed. They should not hang down onto the floor. Have a firm chair that has side arms. You can use this for support while you get dressed. Do not have throw rugs and other things on the floor that can make you trip. What can I do in the kitchen? Clean up any spills right away. Avoid walking on wet floors. Keep items that you use a lot in easy-to-reach places. If you need to reach something above you, use a strong step stool that has a grab bar.  Keep electrical cords out of the way. Do not use floor polish or wax that makes floors slippery. If you must use wax, use non-skid floor wax. Do not have throw rugs and other things on the floor that can make you trip. What can I do with my stairs? Do not leave any items on the stairs. Make sure that there are handrails on both sides of the stairs and use them. Fix handrails that are broken or loose. Make sure that handrails are as long as the stairways. Check any carpeting to make sure that it is firmly attached to the stairs. Fix any carpet that is loose or worn. Avoid having throw rugs at the top or bottom of the stairs. If you do have throw rugs, attach them to the floor with carpet tape. Make sure that you have a light switch at the top of the stairs and the bottom of the stairs. If you do not have them, ask someone to add them for you. What else can I do to help prevent falls? Wear shoes that: Do not have high heels. Have  rubber bottoms. Are comfortable and fit you well. Are closed at the toe. Do not wear sandals. If you use a stepladder: Make sure that it is fully opened. Do not climb a closed stepladder. Make sure that both sides of the stepladder are locked into place. Ask someone to hold it for you, if possible. Clearly mark and make sure that you can see: Any grab bars or handrails. First and last steps. Where the edge of each step is. Use tools that help you move around (mobility aids) if they are needed. These include: Canes. Walkers. Scooters. Crutches. Turn on the lights when you go into a dark area. Replace any light bulbs as soon as they burn out. Set up your furniture so you have a clear path. Avoid moving your furniture around. If any of your floors are uneven, fix them. If there are any pets around you, be aware of where they are. Review your medicines with your doctor. Some medicines can make you feel dizzy. This can increase your chance of falling. Ask your doctor what other things that you can do to help prevent falls. This information is not intended to replace advice given to you by your health care provider. Make sure you discuss any questions you have with your health care provider. Document Released: 10/19/2008 Document Revised: 05/31/2015 Document Reviewed: 01/27/2014 Elsevier Interactive Patient Education  2017 [employer].

## 2022-06-04 NOTE — Progress Notes (Addendum)
Subjective:   Matthew [name] is a 72 y.o. male who presents for Medicare Annual/Subsequent preventive examination.  Review of Systems    Virtual Visit via Telephone Note  I connected with  Avel Sensor on 06/04/22 at  8:15 AM EDT by telephone and verified that I am speaking with the correct person using two identifiers.  Location: Patient: Home Provider: Office Persons participating in the virtual visit: patient/Nurse Health Advisor   I discussed the limitations, risks, security and privacy concerns of performing an evaluation and management service by telephone and the availability of in person appointments. The patient expressed understanding and agreed to proceed.  Interactive audio and video telecommunications were attempted between this nurse and patient, however failed, due to patient having technical difficulties OR patient did not have access to video capability.  We continued and completed visit with audio only.  Some vital signs may be absent or patient reported.   Tillie Rung, LPN  Cardiac Risk Factors include: advanced age (>3men, >22 women);male gender     Objective:    Today's Vitals   06/04/22 0822 06/04/22 0823  Weight: 178 lb (80.7 kg)   Height: 5\' 8"  (1.727 m)   PainSc:  0-No pain   Body mass index is 27.06 kg/m.     06/04/2022    8:29 AM 03/13/2022    7:34 AM 03/11/2022   12:00 AM 03/10/2022    5:57 AM 03/07/2022   11:08 AM 02/13/2022   10:42 AM 04/11/2021   10:41 AM  Advanced Directives  Does Patient Have a Medical Advance Directive? Yes Yes  Yes Yes Yes Yes  Type of Estate agent of [place];Living will Healthcare Power of [place];Living will Living will Living will Healthcare Power of [place];Living will Healthcare Power of [place];Living will Healthcare Power of [place];Living will  Does patient want to make changes to medical advance directive?  No - Patient declined    No - Patient declined No - Patient declined  Copy  of Healthcare Power of Attorney in Chart? No - copy requested No - copy requested No - copy requested  No - copy requested No - copy requested No - copy requested  Would patient like information on creating a medical advance directive?  No - Patient declined No - Patient declined No - Patient declined No - Patient declined      Current Medications (verified) Outpatient Encounter Medications as of 06/04/2022  Medication Sig   amLODipine (NORVASC) 5 MG tablet Take 1 tablet (5 mg total) by mouth daily.   aspirin EC 325 MG tablet Take 1 tablet (325 mg total) by mouth daily.   Docusate Sodium (STOOL SOFTENER) 100 MG capsule Take 100 mg by mouth 2 (two) times daily.   metoprolol tartrate (LOPRESSOR) 25 MG tablet Take 1 tablet (25 mg total) by mouth 2 (two) times daily.   No facility-administered encounter medications on file as of 06/04/2022.    Allergies (verified) Ciprofloxacin   History: Past Medical History:  Diagnosis Date   Asthma    as child   Cancer (HCC)    prostate    Cataract    Dyspnea    exertional dyspnea   ED (erectile dysfunction)    Fracture    rt ankle   Headache    Hx Migraines   Heart murmur    Hemorrhoids    History of colonic polyps    History of prostate cancer    see's Dr. 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 Delaney Meigs    COLONOSCOPY  11/18/2018   per Dr. Rhea Belton, adenomatous polyps, repeat in 7 yrs   HEMORRHOID SURGERY  1999   PROSTATECTOMY  2005   PROSTATECTOMY  1996   REFRACTIVE SURGERY Right 11/07/2013   RETINAL TEAR REPAIR CRYOTHERAPY Bilateral    per Dr. Alan Mulder    RIGHT/LEFT HEART CATH AND CORONARY ANGIOGRAPHY N/A 02/13/2022   Procedure: RIGHT/LEFT HEART CATH AND CORONARY ANGIOGRAPHY;  Surgeon: Kathleene Hazel, MD;  Location: [facility] INVASIVE CV [facility];  Service: Cardiovascular;  Laterality: N/A;   rt bunion removed Left 1965   TEE WITHOUT CARDIOVERSION N/A 03/10/2022   Procedure: TRANSESOPHAGEAL ECHOCARDIOGRAM;  Surgeon: Corliss Skains, MD;  Location: [facility] OR;  Service: Open Heart Surgery;  Laterality: N/A;   Family History  Problem Relation Age of Onset   Alzheimer's disease Mother    Prostate cancer Father    Colon cancer Maternal Uncle 36   Alcohol abuse Other    Arthritis Other    Breast cancer Other    Ovarian cancer Other    Prostate cancer Other    Stroke Other    Esophageal cancer Neg Hx    Rectal cancer Neg Hx    Stomach cancer Neg Hx    Social History   Socioeconomic History   Marital status: Married    Spouse name: Ida   Number of children: 3   Years of education: Not on file   Highest education level: Master's degree (e.g., MA, MS, MEng, MEd, MSW, MBA)  Occupational History   Occupation: Retired  Tobacco Use   Smoking status: Never   Smokeless tobacco: Never  Vaping Use   Vaping Use: Never used  Substance and Sexual Activity   Alcohol use: Yes    Alcohol/week: 0.0 standard drinks of alcohol    Comment: once a month   Drug use: No   Sexual activity: Yes  Other Topics Concern   Not on file  Social History Narrative   Married   Regular exercise- yes   Social Determinants of Health   Financial Resource Strain: Low Risk  (06/04/2022)   Overall Financial Resource Strain (CARDIA)    Difficulty of Paying Living Expenses: Not hard at all  Food Insecurity: No Food Insecurity (06/04/2022)   Hunger Vital Sign    Worried About Running Out of Food in the Last Year: Never true    Ran Out of Food in the Last Year: Never true  Transportation Needs: No Transportation Needs (06/04/2022)   PRAPARE - Scientist, research (physical sciences) (Medical): No    Lack of Transportation (Non-Medical): No  Physical Activity: Sufficiently Active (06/04/2022)   Exercise Vital Sign    Days of Exercise per Week: 5 days    Minutes of Exercise per Session: 60 min  Stress: No Stress Concern Present (06/04/2022)   [employer] of Occupational Health - Occupational Stress Questionnaire    Feeling of Stress : Not at all  Social Connections: Socially Integrated (06/04/2022)   Social Connection and Isolation Panel [NHANES]    Frequency of Communication with Friends and  Family: More than three times a week    Frequency of Social Gatherings with Friends and Family: More than three times a week    Attends Religious Services: More than 4 times per year    Active Member of [employer] or Organizations: Yes    Attends Engineer, structural: More than 4 times per year    Marital Status: Married    Tobacco Counseling Counseling given: Not Answered   Clinical Intake:  Pre-visit preparation completed: Yes  Pain : No/denies pain Pain Score: 0-No pain     BMI - recorded: 27.06 Nutritional Status: BMI 25 -29 Overweight Nutritional Risks: None Diabetes: No  How often do you need to have someone help you when you read instructions, pamphlets, or other written materials from your doctor or pharmacy?: 1 - Never  Diabetic?  No  Interpreter Needed?: No  Information entered by :: Theresa Mulligan LPN   Activities of Daily Living    06/04/2022    8:27 AM 05/31/2022   10:22 AM  In your present state of health, do you have any difficulty performing the following activities:  Hearing? 0 0  Vision? 0 0  Difficulty concentrating or making decisions? 0 0  Walking or climbing stairs? 0 0  Dressing or bathing? 0 0  Doing errands, shopping? 0 0  Preparing Food and eating ? N N  Using the Toilet? N N  In the past six months, have you accidently leaked urine? N N  Do you have problems with loss of bowel control? N N  Managing  your Medications? N N  Managing your Finances? N N  Housekeeping or managing your Housekeeping? N N    Patient Care Team: Nelwyn Salisbury, MD as PCP - General [place] James Nile Dear, MD as PCP - Cardiology (Cardiology)  Indicate any recent Medical Services you may have received from other than [facility] providers in the past year (date may be approximate).     Assessment:   This is a routine wellness examination for [place].  Hearing/Vision screen Hearing Screening - Comments:: Denies hearing difficulties   Vision Screening - Comments:: Wears rx glasses - up to date with routine eye exams with  Dr Blima Ledger  Dietary issues and exercise activities discussed: Current Exercise Habits: Home exercise routine, Type of exercise: walking, Time (Minutes): 60, Frequency (Times/Week): 5, Weekly Exercise (Minutes/Week): 300, Intensity: Moderate, Exercise limited by: None identified   Goals Addressed               This Visit's Progress     Increase physical activity (pt-stated)         Depression Screen    06/04/2022    8:27 AM 05/21/2022   12:59 PM 10/15/2021    8:58 AM 09/17/2021    9:53 AM 04/11/2021   10:36 AM 11/19/2020   12:01 PM 11/06/2016    8:19 AM  PHQ 2/9 Scores  PHQ - 2 Score 0 0 0 0 0 0 0  PHQ- 9 Score 0 0 0 0  3     Fall Risk    06/04/2022    8:28 AM 05/31/2022   10:22 AM 05/30/2022   12:47 PM 05/28/2022   12:48 PM 05/26/2022   12:59 PM  Fall Risk   Falls in the past year? 0 0 0 0 0  Number falls in past yr: 0  0 0 0  Injury with Fall? 0  0 0 0  Risk for fall due to : No Fall  Risks  No Fall Risks No Fall Risks No Fall Risks  Follow up Falls prevention discussed  Falls evaluation completed Falls evaluation completed Falls evaluation completed    FALL RISK PREVENTION PERTAINING TO THE HOME:  Any stairs in or around the home? Yes  If so, are there any without handrails? No  Home free of loose throw rugs in walkways, pet beds, electrical cords, etc? Yes  Adequate  lighting in your home to reduce risk of falls? Yes   ASSISTIVE DEVICES UTILIZED TO PREVENT FALLS:  Life alert? No  Use of a cane, walker or w/c? No  Grab bars in the bathroom? No  Shower chair or bench in shower? No  Elevated toilet seat or a handicapped toilet? No   TIMED UP AND GO:  Was the test performed? No . Audio Visit   Cognitive Function:        06/04/2022    8:29 AM 04/11/2021   10:41 AM  6CIT Screen  What Year? 0 points 0 points  What month? 0 points 0 points  What time? 0 points 0 points  Count back from 20 0 points 0 points  Months in reverse 0 points 0 points  Repeat phrase 0 points 0 points  Total Score 0 points 0 points    Immunizations Immunization History  Administered Date(s) Administered   Fluad Quad(high Dose 65+) 10/11/2018, 09/17/2021   Influenza Split 11/11/2011   Influenza Whole 11/17/2009   Influenza, High Dose Seasonal PF 11/06/2016, 11/06/2017   Influenza,inj,Quad PF,6+ Mos 10/26/2012, 11/01/2013, 11/01/2014   Influenza-Unspecified 11/17/2015, 03/02/2019   PFIZER(Purple Top)SARS-COV-2 Vaccination 11/11/2019   Pfizer Covid-19 Vaccine Bivalent Booster 48yrs & up 09/25/2020   Pneumococcal Conjugate-13 11/06/2016   Pneumococcal Polysaccharide-23 11/06/2017   Tdap 01/07/2011, 12/25/2018    TDAP status: Up to date  Flu Vaccine status: Up to date  Pneumococcal vaccine status: Up to date  Covid-19 vaccine status: Completed vaccines  Qualifies for Shingles Vaccine? Yes   Zostavax completed No   Shingrix Completed?: No.    Education has been provided regarding the importance of this vaccine. Patient has been advised to call insurance company to determine out of pocket expense if they have not yet received this vaccine. Advised may also receive vaccine at local pharmacy or Health Dept. Verbalized acceptance and understanding.  Screening Tests Health Maintenance  Topic Date Due   Zoster Vaccines- Shingrix (1 of 2) 06/25/2022 (Originally  08/22/1969)   COVID-19 Vaccine (3 - Pfizer risk series) 10/06/2022 (Originally 10/23/2020)   Hepatitis C Screening  06/04/2023 (Originally 08/22/1968)   INFLUENZA VACCINE  08/07/2022   Medicare Annual Wellness (AWV)  06/04/2023   Colonoscopy  11/17/2025   DTaP/Tdap/Td (3 - Td or Tdap) 12/24/2028   Pneumonia Vaccine 43+ Years old  Completed   HPV VACCINES  Aged Out    Health Maintenance  There are no preventive care reminders to display for this patient.   Colorectal cancer screening: Type of screening: Colonoscopy. Completed 11/18/18. Repeat every 7 years  Lung Cancer Screening: (Low Dose CT Chest recommended if Age 75-80 years, 30 pack-year currently smoking OR have quit w/in 15years.) does not qualify.     Additional Screening:  Hepatitis C Screening: does qualify; Deferred  Vision Screening: Recommended annual ophthalmology exams for early detection of glaucoma and other disorders of the eye. Is the patient up to date with their annual eye exam?  Yes  Who is the provider or what is the name of the office in which the patient  attends annual eye exams? Dr Blima Ledger If pt is not established with a provider, would they like to be referred to a provider to establish care? No .   Dental Screening: Recommended annual dental exams for proper oral hygiene  Community Resource Referral / Chronic Care Management:  CRR required this visit?  No   CCM required this visit?  No      Plan:     I have personally reviewed and noted the following in the patient's chart:   Medical and social history Use of alcohol, tobacco or illicit drugs  Current medications and supplements including opioid prescriptions. Patient is not currently taking opioid prescriptions. Functional ability and status Nutritional status Physical activity Advanced directives List of other physicians Hospitalizations, surgeries, and ER visits in previous 12 months Vitals Screenings to include cognitive,  depression, and falls Referrals and appointments  In addition, I have reviewed and discussed with patient certain preventive protocols, quality metrics, and best practice recommendations. A written personalized care plan for preventive services as well as general preventive health recommendations were provided to patient.     Tillie Rung, LPN   [id number]/61/0960   Nurse Notes: Patient due Hep-C Screening

## 2022-06-06 ENCOUNTER — Encounter (HOSPITAL_COMMUNITY)
Admission: RE | Admit: 2022-06-06 | Discharge: 2022-06-06 | Disposition: A | Discharge status: Home / Self Care | Payer: Medicare HMO | Source: Ambulatory Visit | Attending: Cardiovascular Disease | Admitting: Cardiovascular Disease

## 2022-06-06 DIAGNOSIS — Z9889 Other specified postprocedural states: Secondary | ICD-10-CM

## 2022-06-09 ENCOUNTER — Encounter (HOSPITAL_COMMUNITY)
Admission: RE | Admit: 2022-06-09 | Discharge: 2022-06-09 | Disposition: A | Discharge status: Home / Self Care | Payer: Medicare HMO | Source: Ambulatory Visit | Attending: Cardiovascular Disease | Admitting: Cardiovascular Disease

## 2022-06-09 DIAGNOSIS — Z9889 Other specified postprocedural states: Secondary | ICD-10-CM | POA: Insufficient documentation

## 2022-06-09 NOTE — Progress Notes (Unsigned)
No chief complaint on file.  History of Present Illness: 72 yo male with history of prostate cancer and aortic valve insufficiency s/p surgical AVR in January 2024 who is here today for follow up. He was initially seen in our office in January 2023 for dyspnea and fatigue. Echo in December 2022 with normal LV function with moderate aortic insufficiency. Coronary CTA 01/23/21 with no evidence of CAD, calcium score 0. No evidence of dilated aortic root on CTA January 2023. Repeat echo December 2023 with LVEF=60-65% with severe aortic valve regurgitation. Cardiac cath February 2024 with no evidence of CAD. He underwent surgical AVR with placement of 25 mm Inspiris Resilia bioprosthetic valve in March 2024. Echo April 2024 with normal LV systolic function and normally functioning AVR.   He is here today for follow up. The patient denies any chest pain, dyspnea, palpitations, lower extremity edema, orthopnea, PND, dizziness, near syncope or syncope.    Primary Care Physician: Nelwyn Salisbury, MD  Past Medical History:  Diagnosis Date   Asthma    as child   Cancer [facility])    prostate    Cataract    Dyspnea    exertional dyspnea   ED (erectile dysfunction)    Fracture    rt ankle   Headache    Hx Migraines   Heart murmur    Hemorrhoids    History of colonic polyps    History of prostate cancer    see's Dr. Brunilda Payor   Pneumonia    50+ years ago    Past Surgical History:  Procedure Laterality Date   ANKLE FRACTURE SURGERY Left 2001   AORTIC VALVE REPLACEMENT N/A 03/10/2022   Procedure: AORTIC VALVE REPLACEMENT WITH EDWARDS INSPIRIS VALVE;  Surgeon: Corliss Skains, MD;  Location: [facility] OR;  Service: Open Heart Surgery;  Laterality: N/A;   CATARACT EXTRACTION W/ INTRAOCULAR LENS IMPLANT Bilateral    per Dr. Delaney Meigs    COLONOSCOPY  11/18/2018   per Dr. Rhea Belton, adenomatous polyps, repeat in 7 yrs   HEMORRHOID SURGERY  1999   PROSTATECTOMY  2005   PROSTATECTOMY  1996   REFRACTIVE  SURGERY Right 11/07/2013   RETINAL TEAR REPAIR CRYOTHERAPY Bilateral    per Dr. Alan Mulder   RIGHT/LEFT HEART CATH AND CORONARY ANGIOGRAPHY N/A 02/13/2022   Procedure: RIGHT/LEFT HEART CATH AND CORONARY ANGIOGRAPHY;  Surgeon: Kathleene Hazel, MD;  Location: [facility] INVASIVE CV LAB;  Service: Cardiovascular;  Laterality: N/A;   rt bunion removed Left 1965   TEE WITHOUT CARDIOVERSION N/A 03/10/2022   Procedure: TRANSESOPHAGEAL ECHOCARDIOGRAM;  Surgeon: Corliss Skains, MD;  Location: [facility] OR;  Service: Open Heart Surgery;  Laterality: N/A;    Current Outpatient Medications  Medication Sig Dispense Refill   amLODipine (NORVASC) 5 MG tablet Take 1 tablet (5 mg total) by mouth daily. 30 tablet 2   aspirin EC 325 MG tablet Take 1 tablet (325 mg total) by mouth daily.     Docusate Sodium (STOOL SOFTENER) 100 MG capsule Take 100 mg by mouth 2 (two) times daily.     metoprolol tartrate (LOPRESSOR) 25 MG tablet Take 1 tablet (25 mg total) by mouth 2 (two) times daily. 60 tablet 3   No current facility-administered medications for this visit.    Allergies  Allergen Reactions   Ciprofloxacin     headaches    Social History   Socioeconomic History   Marital status: Married    Spouse name: Malachi Bonds   Number of children: 3  Years of education: Not on file   Highest education level: Master's degree (e.g., MA, MS, MEng, MEd, MSW, MBA)  Occupational History   Occupation: Retired  Tobacco Use   Smoking status: Never   Smokeless tobacco: Never  Vaping Use   Vaping Use: Never used  Substance and Sexual Activity   Alcohol use: Yes    Alcohol/week: 0.0 standard drinks of alcohol    Comment: once a month   Drug use: No   Sexual activity: Yes  Other Topics Concern   Not on file  Social History Narrative   Married   Regular exercise- yes   Social Determinants of Health   Financial Resource Strain: Low Risk  (06/04/2022)   Overall Financial Resource Strain (CARDIA)    Difficulty of  Paying Living Expenses: Not hard at all  Food Insecurity: No Food Insecurity (06/04/2022)   Hunger Vital Sign    Worried About Running Out of Food in the Last Year: Never true    Ran Out of Food in the Last Year: Never true  Transportation Needs: No Transportation Needs (06/04/2022)   PRAPARE - Administrator, Civil Service (Medical): No    Lack of Transportation (Non-Medical): No  Physical Activity: Sufficiently Active (06/04/2022)   Exercise Vital Sign    Days of Exercise per Week: 5 days    Minutes of Exercise per Session: 60 min  Stress: No Stress Concern Present (06/04/2022)   [employer] of Occupational Health - Occupational Stress Questionnaire    Feeling of Stress : Not at all  Social Connections: Socially Integrated (06/04/2022)   Social Connection and Isolation Panel [NHANES]    Frequency of Communication with Friends and Family: More than three times a week    Frequency of Social Gatherings with Friends and Family: More than three times a week    Attends Religious Services: More than 4 times per year    Active Member of [employer] or Organizations: Yes    Attends Engineer, structural: More than 4 times per year    Marital Status: Married  Catering manager Violence: Not At Risk (06/04/2022)   Humiliation, Afraid, Rape, and Kick questionnaire    Fear of Current or Ex-Partner: No    Emotionally Abused: No    Physically Abused: No    Sexually Abused: No    Family History  Problem Relation Age of Onset   Alzheimer's disease Mother    Prostate cancer Father    Colon cancer Maternal Uncle 31   Alcohol abuse Other    Arthritis Other    Breast cancer Other    Ovarian cancer Other    Prostate cancer Other    Stroke Other    Esophageal cancer Neg Hx    Rectal cancer Neg Hx    Stomach cancer Neg Hx     Review of Systems:  As stated in the HPI and otherwise negative.   There were no vitals taken for this visit.  Physical Examination: General: Well  developed, well nourished, NAD  HEENT: OP clear, mucus membranes moist  SKIN: warm, dry. No rashes. Neuro: No focal deficits  Musculoskeletal: Muscle strength 5/5 all ext  Psychiatric: Mood and affect normal  Neck: No JVD, no carotid bruits, no thyromegaly, no lymphadenopathy.  Lungs:Clear bilaterally, no wheezes, rhonci, crackles Cardiovascular: Regular rate and rhythm. No murmurs, gallops or rubs. Abdomen:Soft. Bowel sounds present. Non-tender.  Extremities: No lower extremity edema. Pulses are 2 + in the bilateral DP/PT.  EKG:  EKG is *** ordered today. The ekg ordered today demonstrates   Echo April 2024:  1. Left ventricular ejection fraction, by estimation, is 55 to 60%. Left  ventricular ejection fraction by 3D volume is 62 %. The left ventricle has  normal function. The left ventricle has no regional wall motion  abnormalities. Left ventricular diastolic   parameters were normal. There is abnormal septal motion postoperative.   2. Right ventricular systolic function is mildly reduced. The right  ventricular size is normal. There is normal pulmonary artery systolic  pressure. The estimated right ventricular systolic pressure is 25.1 mmHg.   3. The mitral valve is grossly normal. No evidence of mitral valve  regurgitation. No evidence of mitral stenosis.   4. The aortic valve has been repaired/replaced. Aortic valve  regurgitation is not visualized. There is a 25 mm Edwards Inspiris valve  present in the aortic position. Procedure Date: 03/10/2022. Echo findings  are consistent with normal structure and  function of the aortic valve prosthesis. Aortic valve area, by VTI  measures 2.15 cm. Aortic valve mean gradient measures 11.3 mmHg. Aortic  valve Vmax measures 2.28 m/s. Aortic valve acceleration time measures 79  msec. DVI 0.41, AT/ET 0.33, iEOA 1.1 cm2.   5. Aortic dilatation noted. There is borderline dilatation of the aortic  root, measuring 40 mm. There is borderline  dilatation of the ascending  aorta, measuring 41 mm.   6. The inferior vena cava is normal in size with greater than 50%  respiratory variability, suggesting right atrial pressure of 3 mmHg.   Recent Labs: 09/17/2021: TSH 1.58 03/07/2022: ALT 11 03/11/2022: Magnesium 2.0 04/07/2022: BUN 10; Creatinine, Ser 0.85; Potassium 4.3; Sodium 141 05/01/2022: Hemoglobin 12.3; Platelets 335   Lipid Panel    Component Value Date/Time   CHOL 168 09/17/2021 0934   TRIG 80.0 09/17/2021 0934   HDL 64.50 09/17/2021 0934   CHOLHDL 3 09/17/2021 0934   VLDL 16.0 09/17/2021 0934   LDLCALC 88 09/17/2021 0934     Wt Readings from Last 3 Encounters:  06/04/22 80.7 kg  05/21/22 83.8 kg  05/01/22 80.7 kg    Assessment and Plan:   Aortic valve insufficiency: He is doing well post surgical AVR. No complaints today. AVR working well by echo April 2024. Continue ASA.   Labs/ tests ordered today include:  No orders of the defined types were placed in this encounter.  Disposition:   F/U with me in 12 months  Signed, Verne Carrow, MD 06/09/2022 2:51 PM    [facility] [address] [place], [place], [place]  [address] Phone: [phone]; Fax: [phone]

## 2022-06-10 ENCOUNTER — Encounter: Payer: Self-pay | Admitting: Cardiovascular Disease

## 2022-06-10 ENCOUNTER — Ambulatory Visit: Payer: Medicare HMO | Attending: Cardiovascular Disease | Admitting: Cardiovascular Disease

## 2022-06-10 VITALS — BP 120/90 | HR 73 | Ht 68.0 in | Wt 183.0 lb

## 2022-06-10 DIAGNOSIS — I351 Nonrheumatic aortic (valve) insufficiency: Secondary | ICD-10-CM

## 2022-06-10 MED ORDER — ASPIRIN 81 MG PO TBEC: 81.0000 mg | DELAYED_RELEASE_TABLET | Freq: Every day | ORAL | 3 refills | Status: AC

## 2022-06-10 NOTE — Progress Notes (Signed)
Cardiac Individual Treatment Plan  Patient Details  Name: Matthew [name] MRN: [id number] Date of Birth: 1950-07-14 Referring Provider:   Flowsheet Row INTENSIVE CARDIAC REHAB ORIENT from 05/21/2022 in [facility], & [facility]  Referring Provider Matthew Carrow, MD       Initial Encounter Date:  Flowsheet Row INTENSIVE CARDIAC REHAB ORIENT from 05/21/2022 in [facility] for Heart, Vascular, & Lung Health  Date 05/21/22       Visit Diagnosis: 03/10/22 S/P aortic valve replacement  Patient's Home Medications on Admission:  Current Outpatient Medications:    amLODipine (NORVASC) 5 MG tablet, Take 1 tablet (5 mg total) by mouth daily., Disp: 30 tablet, Rfl: 2   aspirin EC 325 MG tablet, Take 1 tablet (325 mg total) by mouth daily., Disp: , Rfl:    Docusate Sodium (STOOL SOFTENER) 100 MG capsule, Take 100 mg by mouth 2 (two) times daily., Disp: , Rfl:    metoprolol tartrate (LOPRESSOR) 25 MG tablet, Take 1 tablet (25 mg total) by mouth 2 (two) times daily., Disp: 60 tablet, Rfl: 3  Past Medical History: Past Medical History:  Diagnosis Date   Asthma    as child   Cancer (HCC)    prostate    Cataract    Dyspnea    exertional dyspnea   ED (erectile dysfunction)    Fracture    rt ankle   Headache    Hx Migraines   Heart murmur    Hemorrhoids    History of colonic polyps    History of prostate cancer    see's Dr. [name]   Pneumonia    50+ years ago    Tobacco Use: Social History   Tobacco Use  Smoking Status Never  Smokeless Tobacco Never    Labs: Review Flowsheet  More data exists      Latest Ref Rng & Units 09/17/2021 02/13/2022 03/07/2022 03/10/2022 03/11/2022  Labs for ITP Cardiac and Pulmonary Rehab  Cholestrol 0 - 200 mg/dL 409  - - - -  LDL (calc) 0 - 99 mg/dL 88  - - - -  HDL-C >81.19 mg/dL 14.78  - - - -  Trlycerides 0.0 - 149.0 mg/dL 29.5  - - - -  Hemoglobin A1c 4.8 - 5.6 % 5.6  - 5.5   - -  PH, Arterial 7.35 - 7.45 - 7.445  7.44  7.391  7.332  7.296  7.413  7.382  7.340  7.380   PCO2 arterial 32 - 48 mmHg - 35.6  40  40.0  43.6  47.9  39.3  35.3  47.7  43.0   Bicarbonate 20.0 - 28.0 mmol/L - 28.3  24.4  27.2  24.1  23.4  23.7  25.0  21.0  25.8  25.1   TCO2 22 - 32 mmol/L - 30  25  - 25  25  25  28  26  26  22  22  26  28  27  26    Acid-base deficit 0.0 - 2.0 mmol/L - - - 1.0  3.0  3.0  4.0  -  O2 Saturation % - 67  98  99.6  99  98  95  100  100  100  98     Capillary Blood Glucose: Lab Results  Component Value Date   GLUCAP 113 (H) 03/15/2022   GLUCAP 134 (H) 03/14/2022   GLUCAP 103 (H) 03/14/2022   GLUCAP 98 03/14/2022  GLUCAP 97 03/14/2022     Exercise Target Goals: Exercise Program Goal: Individual exercise prescription set using results from initial 6 min walk test and THRR while considering  patient's activity barriers and safety.   Exercise Prescription Goal: Initial exercise prescription builds to 30-45 minutes a day of aerobic activity, 2-3 days per week.  Home exercise guidelines will be given to patient during program as part of exercise prescription that the participant will acknowledge.  Activity Barriers & Risk Stratification:  Activity Barriers & Cardiac Risk Stratification - 05/21/22 1300       Activity Barriers & Cardiac Risk Stratification   Activity Barriers Other (comment);Balance Concerns    Comments sternal precautions    Cardiac Risk Stratification High   <5 METs on            6 Minute Walk:  6 Minute Walk     Row Name 05/21/22 1259         6 Minute Walk   Phase Initial     Distance 2040 feet     Walk Time 6 minutes     # of Rest Breaks 0     MPH 3.86     METS 4.16     RPE 11     Perceived Dyspnea  0     VO2 Peak 14.6     Symptoms No     Resting HR 61 bpm     Resting BP 120/82     Resting Oxygen Saturation  98 %     Exercise Oxygen Saturation  during 6 min walk 98 %     Max Ex. HR 101 bpm     Max Ex. BP  136/80     2 Minute Post BP 110/78              Oxygen Initial Assessment:   Oxygen Re-Evaluation:   Oxygen Discharge (Final Oxygen Re-Evaluation):   Initial Exercise Prescription:  Initial Exercise Prescription - 05/21/22 1300       Date of Initial Exercise RX and Referring Provider   Date 05/21/22    Referring Provider Matthew Carrow, MD    Expected Discharge Date 08/01/22      Bike   Level 3    Watts 40    Minutes 15    METs 3.5      Recumbant Elliptical   Level 3    RPM 65    Watts 95    Minutes 15    METs 3.5      Prescription Details   Frequency (times per week) 3    Duration Progress to 30 minutes of continuous aerobic without signs/symptoms of physical distress      Intensity   THRR 40-80% of Max Heartrate 59-119    Ratings of Perceived Exertion 11-13    Perceived Dyspnea 0-4      Progression   Progression Continue progressive overload as per policy without signs/symptoms or physical distress.      Resistance Training   Training Prescription Yes    Weight 4    Reps 10-15             Perform Capillary Blood Glucose checks as needed.  Exercise Prescription Changes:   Exercise Prescription Changes     Row Name 05/26/22 1600 06/09/22 1400           Response to Exercise   Blood Pressure (Admit) 122/82 120/72      Blood Pressure (Exercise) 140/60 124/78  Blood Pressure (Exit) 126/94 114/82      Heart Rate (Admit) 58 bpm 60 bpm      Heart Rate (Exercise) 91 bpm 109 bpm      Heart Rate (Exit) 67 bpm 69 bpm      Rating of Perceived Exertion (Exercise) 9 10      Symptoms None None      Comments Pt's first day in the CRP2 program. Reviewed METs      Duration Continue with 30 min of aerobic exercise without signs/symptoms of physical distress. Continue with 30 min of aerobic exercise without signs/symptoms of physical distress.      Intensity THRR unchanged THRR unchanged        Progression   Progression Continue to  progress workloads to maintain intensity without signs/symptoms of physical distress. Continue to progress workloads to maintain intensity without signs/symptoms of physical distress.      Average METs 4.6 6.1        Resistance Training   Training Prescription Yes Yes      Weight 4 5 lbs      Reps 10-15 10-15      Time 10 Minutes 10 Minutes        Interval Training   Interval Training No No        Bike   Level 3 4      Watts 45 69      Minutes 15 15      METs 4.7 6.9        Recumbant Elliptical   Level 3 4      Watts -- 118      Minutes 15 15      METs 4.5 5.3               Exercise Comments:   Exercise Comments     Row Name 05/26/22 1642 06/09/22 1438         Exercise Comments Pt's first day in the CRP2 program. Pt had no complaints. Reviewed METs. Pt is making excelelnt progroess in the CRP2 program.               Exercise Goals and Review:   Exercise Goals     Row Name 05/21/22 1303             Exercise Goals   Increase Physical Activity Yes       Intervention Provide advice, education, support and counseling about physical activity/exercise needs.;Develop an individualized exercise prescription for aerobic and resistive training based on initial evaluation findings, risk stratification, comorbidities and participant's personal goals.       Expected Outcomes Short Term: Attend rehab on a regular basis to increase amount of physical activity.;Long Term: Exercising regularly at least 3-5 days a week.;Long Term: Add in home exercise to make exercise part of routine and to increase amount of physical activity.       Increase Strength and Stamina Yes       Intervention Provide advice, education, support and counseling about physical activity/exercise needs.;Develop an individualized exercise prescription for aerobic and resistive training based on initial evaluation findings, risk stratification, comorbidities and participant's personal goals.        Expected Outcomes Short Term: Increase workloads from initial exercise prescription for resistance, speed, and METs.;Short Term: Perform resistance training exercises routinely during rehab and add in resistance training at home;Long Term: Improve cardiorespiratory fitness, muscular endurance and strength as measured by increased METs and functional capacity ( )  Able to understand and use rate of perceived exertion (RPE) scale Yes       Intervention Provide education and explanation on how to use RPE scale       Expected Outcomes Short Term: Able to use RPE daily in rehab to express subjective intensity level;Long Term:  Able to use RPE to guide intensity level when exercising independently       Knowledge and understanding of Target Heart Rate Range (THRR) Yes       Intervention Provide education and explanation of THRR including how the numbers were predicted and where they are located for reference       Expected Outcomes Short Term: Able to use daily as guideline for intensity in rehab;Long Term: Able to use THRR to govern intensity when exercising independently;Short Term: Able to state/look up THRR       Understanding of Exercise Prescription Yes       Intervention Provide education, explanation, and written materials on patient's individual exercise prescription       Expected Outcomes Short Term: Able to explain program exercise prescription;Long Term: Able to explain home exercise prescription to exercise independently                Exercise Goals Re-Evaluation :  Exercise Goals Re-Evaluation     Row Name 05/26/22 1641             Exercise Goal Re-Evaluation   Exercise Goals Review Increase Physical Activity;Increase Strength and Stamina;Able to understand and use rate of perceived exertion (RPE) scale;Knowledge and understanding of Target Heart Rate Range (THRR);Understanding of Exercise Prescription       Comments Pt's first day in the CRP2 program. Pt understnads the  RPE scale, THRR and exercise Rx.       Expected Outcomes Will continue to monitor patient and progress exercise workloads as tolerated.                Discharge Exercise Prescription (Final Exercise Prescription Changes):  Exercise Prescription Changes - 06/09/22 1400       Response to Exercise   Blood Pressure (Admit) 120/72    Blood Pressure (Exercise) 124/78    Blood Pressure (Exit) 114/82    Heart Rate (Admit) 60 bpm    Heart Rate (Exercise) 109 bpm    Heart Rate (Exit) 69 bpm    Rating of Perceived Exertion (Exercise) 10    Symptoms None    Comments Reviewed METs    Duration Continue with 30 min of aerobic exercise without signs/symptoms of physical distress.    Intensity THRR unchanged      Progression   Progression Continue to progress workloads to maintain intensity without signs/symptoms of physical distress.    Average METs 6.1      Resistance Training   Training Prescription Yes    Weight 5 lbs    Reps 10-15    Time 10 Minutes      Interval Training   Interval Training No      Bike   Level 4    Watts 69    Minutes 15    METs 6.9      Recumbant Elliptical   Level 4    Watts 118    Minutes 15    METs 5.3             Nutrition:  Target Goals: Understanding of nutrition guidelines, daily intake of sodium 1500mg , cholesterol 200mg , calories 30% from fat and 7% or less from saturated fats,  daily to have 5 or more servings of fruits and vegetables.  Biometrics:  Pre Biometrics - 05/21/22 1257       Pre Biometrics   Waist Circumference 35.5 inches    Hip Circumference 42 inches    Waist to Hip Ratio 0.85 %    Triceps Skinfold 15 mm    % Body Fat 25.6 %    Grip Strength 48 kg    Flexibility 11.5 in    Single Leg Stand 4.68 seconds              Nutrition Therapy Plan and Nutrition Goals:  Nutrition Therapy & Goals - 05/26/22 1334       Nutrition Therapy   Diet Heart Healthy Diet      Personal Nutrition Goals   Nutrition Goal  Patient to identify strategies for reducing cardiovascular risk by attending the Pritikin education and nutrition series weekly.    Personal Goal #2 Patient to improve diet quality by using the plate method as a guide for meal planning to include lean protein/plant protein, fruits, vegetables, whole grains, nonfat dairy as part of a well-balanced diet.    Comments Matthew [name] reports eating a wide variety foods of foods and normal/standard eating patterns. He denies nutrition questions/concerns at this time. Patient will benefit from participation in intensive cardiac rehab for nutrition, exercise, and lifestyle modification.      Intervention Plan   Intervention Prescribe, educate and counsel regarding individualized specific dietary modifications aiming towards targeted core components such as weight, hypertension, lipid management, diabetes, heart failure and other comorbidities.;Nutrition handout(s) given to patient.    Expected Outcomes Short Term Goal: Understand basic principles of dietary content, such as calories, fat, sodium, cholesterol and nutrients.;Long Term Goal: Adherence to prescribed nutrition plan.             Nutrition Assessments:  MEDIFICTS Score Key: ?70 Need to make dietary changes  40-70 Heart Healthy Diet ? 40 Therapeutic Level Cholesterol Diet    Picture Your Plate Scores: <16 Unhealthy dietary pattern with much room for improvement. 41-50 Dietary pattern unlikely to meet recommendations for good health and room for improvement. 51-60 More healthful dietary pattern, with some room for improvement.  >60 Healthy dietary pattern, although there may be some specific behaviors that could be improved.    Nutrition Goals Re-Evaluation:  Nutrition Goals Re-Evaluation     Row Name 05/26/22 1334             Goals   Current Weight 184 lb 11.9 oz (83.8 kg)       Comment A1c WNL, lipids WNL       Expected Outcome Matthew [name] reports eating a wide variety foods  of foods and normal/standard eating patterns. He denies nutrition questions/concerns at this time. Patient will benefit from participation in intensive cardiac rehab for nutrition, exercise, and lifestyle modification.                Nutrition Goals Re-Evaluation:  Nutrition Goals Re-Evaluation     Row Name 05/26/22 1334             Goals   Current Weight 184 lb 11.9 oz (83.8 kg)       Comment A1c WNL, lipids WNL       Expected Outcome Matthew [name] reports eating a wide variety foods of foods and normal/standard eating patterns. He denies nutrition questions/concerns at this time. Patient will benefit from participation in intensive cardiac rehab for nutrition, exercise, and lifestyle modification.  Nutrition Goals Discharge (Final Nutrition Goals Re-Evaluation):  Nutrition Goals Re-Evaluation - 05/26/22 1334       Goals   Current Weight 184 lb 11.9 oz (83.8 kg)    Comment A1c WNL, lipids WNL    Expected Outcome Matthew [name] reports eating a wide variety foods of foods and normal/standard eating patterns. He denies nutrition questions/concerns at this time. Patient will benefit from participation in intensive cardiac rehab for nutrition, exercise, and lifestyle modification.             Psychosocial: Target Goals: Acknowledge presence or absence of significant depression and/or stress, maximize coping skills, provide positive support system. Participant is able to verbalize types and ability to use techniques and skills needed for reducing stress and depression.  Initial Review & Psychosocial Screening:  Initial Psych Review & Screening - 05/21/22 1303       Initial Review   Current issues with None Identified      Family Dynamics   Good Support System? Yes   Matthew [name] has his wife and children for support     Barriers   Psychosocial barriers to participate in program There are no identifiable barriers or psychosocial needs.      Screening  Interventions   Interventions Encouraged to exercise;Provide feedback about the scores to participant    Expected Outcomes Short Term goal: Identification and review with participant of any Quality of Life or Depression concerns found by scoring the questionnaire.;Long Term goal: The participant improves quality of Life and PHQ9 Scores as seen by post scores and/or verbalization of changes             Quality of Life Scores:  Quality of Life - 05/21/22 1304       Quality of Life   Select Quality of Life      Quality of Life Scores   Health/Function Pre 27.33 %    Socioeconomic Pre 29.29 %    Psych/Spiritual Pre 29.64 %    Family Pre 28.8 %    GLOBAL Pre 28.43 %            Scores of 19 and below usually indicate a poorer quality of life in these areas.  A difference of  2-3 points is a clinically meaningful difference.  A difference of 2-3 points in the total score of the Quality of Life Index has been associated with significant improvement in overall quality of life, self-image, physical symptoms, and general health in studies assessing change in quality of life.  PHQ-9: Review Flowsheet  More data exists      06/04/2022 05/21/2022 10/15/2021 09/17/2021 04/11/2021  Depression screen PHQ 2/9  Decreased Interest 0 0 0 0 0  Down, Depressed, Hopeless 0 0 0 0 0  PHQ - 2 Score 0 0 0 0 0  Altered sleeping 0 0 0 0 -  Tired, decreased energy 0 0 0 0 -  Change in appetite 0 0 0 0 -  Feeling bad or failure about yourself  0 0 0 0 -  Trouble concentrating 0 0 0 0 -  Moving slowly or fidgety/restless 0 0 0 0 -  Suicidal thoughts 0 0 0 0 -  PHQ-9 Score 0 0 0 0 -  Difficult doing work/chores - - Not difficult at all Not difficult at all -   Interpretation of Total Score  Total Score Depression Severity:  1-4 = Minimal depression, 5-9 = Mild depression, 10-14 = Moderate depression, 15-19 = Moderately severe depression, 20-27 = Severe  depression   Psychosocial Evaluation and  Intervention:   Psychosocial Re-Evaluation:   Psychosocial Discharge (Final Psychosocial Re-Evaluation):   Vocational Rehabilitation: Provide vocational rehab assistance to qualifying candidates.   Vocational Rehab Evaluation & Intervention:  Vocational Rehab - 05/21/22 1306       Initial Vocational Rehab Evaluation & Intervention   Assessment shows need for Vocational Rehabilitation No   Matthew [name] is retired            Education: Education Goals: Education classes will be provided on a weekly basis, covering required topics. Participant will state understanding/return demonstration of topics presented.    Education     Row Name 05/26/22 1600     Education   Cardiac Education Topics Pritikin   Geographical information systems officer Psychosocial   Psychosocial Workshop From Head to Heart: The Power of a Healthy Outlook   Instruction Review Code 1- Verbalizes Understanding   Class Start Time 1405   Class Stop Time 1503   Class Time Calculation (min) 58 min    Row Name 05/30/22 1400     Education   Cardiac Education Topics Pritikin   Psychologist, forensic General Education   General Education Hypertension and Heart Disease   Instruction Review Code 1- Verbalizes Understanding   Class Start Time 1400   Class Stop Time 1442   Class Time Calculation (min) 42 min    Row Name 06/04/22 1500     Education   Cardiac Education Topics Pritikin   Customer service manager   Weekly Topic Adding Flavor - Sodium-Free   Instruction Review Code 1- Verbalizes Understanding   Class Start Time 1400   Class Stop Time 1445   Class Time Calculation (min) 45 min    Row Name 06/06/22 1600     Education   Cardiac Education Topics Pritikin   Engineer, mining Education   General Education  Heart Disease Risk Reduction   Instruction Review Code 1- Verbalizes Understanding   Class Start Time 1405   Class Stop Time 1450   Class Time Calculation (min) 45 min    Row Name 06/09/22 1600     Education   Cardiac Education Topics Pritikin   Select Workshops     Workshops   Educator Exercise Physiologist   Select Exercise   Exercise Workshop Location manager and Fall Prevention   Instruction Review Code 1- Verbalizes Understanding   Class Start Time 1405   Class Stop Time 1445   Class Time Calculation (min) 40 min            Core Videos: Exercise    Move It!  Clinical staff conducted group or individual video education with verbal and written material and guidebook.  Patient learns the recommended Pritikin exercise program. Exercise with the goal of living a long, healthy life. Some of the health benefits of exercise include controlled diabetes, healthier blood pressure levels, improved cholesterol levels, improved heart and lung capacity, improved sleep, and better body composition. Everyone should speak with their doctor before starting or changing an exercise routine.  Biomechanical Limitations Clinical staff conducted group or individual video education with verbal and written material and guidebook.  Patient learns how biomechanical limitations can impact exercise and  how we can mitigate and possibly overcome limitations to have an impactful and balanced exercise routine.  Body Composition Clinical staff conducted group or individual video education with verbal and written material and guidebook.  Patient learns that body composition (ratio of muscle mass to fat mass) is a key component to assessing overall fitness, rather than body weight alone. Increased fat mass, especially visceral belly fat, can put [place] at increased risk for metabolic syndrome, type 2 diabetes, heart disease, and even death. It is recommended to combine diet and exercise (cardiovascular and  resistance training) to improve your body composition. Seek guidance from your physician and exercise physiologist before implementing an exercise routine.  Exercise Action Plan Clinical staff conducted group or individual video education with verbal and written material and guidebook.  Patient learns the recommended strategies to achieve and enjoy long-term exercise adherence, including variety, self-motivation, self-efficacy, and positive decision making. Benefits of exercise include fitness, good health, weight management, more energy, better sleep, less stress, and overall well-being.  Medical   Heart Disease Risk Reduction Clinical staff conducted group or individual video education with verbal and written material and guidebook.  Patient learns our heart is our most vital organ as it circulates oxygen, nutrients, white blood cells, and hormones throughout the entire body, and carries waste away. Data supports a plant-based eating plan like the Pritikin Program for its effectiveness in slowing progression of and reversing heart disease. The video provides a number of recommendations to address heart disease.   Metabolic Syndrome and Belly Fat  Clinical staff conducted group or individual video education with verbal and written material and guidebook.  Patient learns what metabolic syndrome is, how it leads to heart disease, and how one can reverse it and keep it from coming back. You have metabolic syndrome if you have 3 of the following 5 criteria: abdominal obesity, high blood pressure, high triglycerides, low HDL cholesterol, and high blood sugar.  Hypertension and Heart Disease Clinical staff conducted group or individual video education with verbal and written material and guidebook.  Patient learns that high blood pressure, or hypertension, is very common in the [place]. Hypertension is largely due to excessive salt intake, but other important risk factors include being overweight,  physical inactivity, drinking too much alcohol, smoking, and not eating enough potassium from fruits and vegetables. High blood pressure is a leading risk factor for heart attack, stroke, congestive heart failure, dementia, kidney failure, and premature death. Long-term effects of excessive salt intake include stiffening of the arteries and thickening of heart muscle and organ damage. Recommendations include ways to reduce hypertension and the risk of heart disease.  Diseases of Our Time - Focusing on Diabetes Clinical staff conducted group or individual video education with verbal and written material and guidebook.  Patient learns why the best way to stop diseases of our time is prevention, through food and other lifestyle changes. Medicine (such as prescription pills and surgeries) is often only a Band-Aid on the problem, not a long-term solution. Most common diseases of our time include obesity, type 2 diabetes, hypertension, heart disease, and cancer. The Pritikin Program is recommended and has been proven to help reduce, reverse, and/or prevent the damaging effects of metabolic syndrome.  Nutrition   Overview of the Pritikin Eating Plan  Clinical staff conducted group or individual video education with verbal and written material and guidebook.  Patient learns about the Pritikin Eating Plan for disease risk reduction. The Pritikin Eating Plan emphasizes a wide variety of  unrefined, minimally-processed carbohydrates, like fruits, vegetables, whole grains, and legumes. Go, Caution, and Stop food choices are explained. Plant-based and lean animal proteins are emphasized. Rationale provided for low sodium intake for blood pressure control, low added sugars for blood sugar stabilization, and low added fats and oils for coronary artery disease risk reduction and weight management.  Calorie Density  Clinical staff conducted group or individual video education with verbal and written material and  guidebook.  Patient learns about calorie density and how it impacts the Pritikin Eating Plan. Knowing the characteristics of the food you choose will help you decide whether those foods will lead to weight gain or weight loss, and whether you want to consume more or less of them. Weight loss is usually a side effect of the Pritikin Eating Plan because of its focus on low calorie-dense foods.  Label Reading  Clinical staff conducted group or individual video education with verbal and written material and guidebook.  Patient learns about the Pritikin recommended label reading guidelines and corresponding recommendations regarding calorie density, added sugars, sodium content, and whole grains.  Dining Out - Part 1  Clinical staff conducted group or individual video education with verbal and written material and guidebook.  Patient learns that restaurant meals can be sabotaging because they can be so high in calories, fat, sodium, and/or sugar. Patient learns recommended strategies on how to positively address this and avoid unhealthy pitfalls.  Facts on Fats  Clinical staff conducted group or individual video education with verbal and written material and guidebook.  Patient learns that lifestyle modifications can be just as effective, if not more so, as many medications for lowering your risk of heart disease. A Pritikin lifestyle can help to reduce your risk of inflammation and atherosclerosis (cholesterol build-up, or plaque, in the artery walls). Lifestyle interventions such as dietary choices and physical activity address the cause of atherosclerosis. A review of the types of fats and their impact on blood cholesterol levels, along with dietary recommendations to reduce fat intake is also included.  Nutrition Action Plan  Clinical staff conducted group or individual video education with verbal and written material and guidebook.  Patient learns how to incorporate Pritikin recommendations into  their lifestyle. Recommendations include planning and keeping personal health goals in mind as an important part of their success.  Healthy Mind-Set    Healthy Minds, Bodies, Hearts  Clinical staff conducted group or individual video education with verbal and written material and guidebook.  Patient learns how to identify when they are stressed. Video will discuss the impact of that stress, as well as the many benefits of stress management. Patient will also be introduced to stress management techniques. The way we think, act, and feel has an impact on our hearts.  How Our Thoughts Can Heal Our Hearts  Clinical staff conducted group or individual video education with verbal and written material and guidebook.  Patient learns that negative thoughts can cause depression and anxiety. This can result in negative lifestyle behavior and serious health problems. Cognitive behavioral therapy is an effective method to help control our thoughts in order to change and improve our emotional outlook.  Additional Videos:  Exercise    Improving Performance  Clinical staff conducted group or individual video education with verbal and written material and guidebook.  Patient learns to use a non-linear approach by alternating intensity levels and lengths of time spent exercising to help burn more calories and lose more body fat. Cardiovascular exercise helps improve heart health,  metabolism, hormonal balance, blood sugar control, and recovery from fatigue. Resistance training improves strength, endurance, balance, coordination, reaction time, metabolism, and muscle mass. Flexibility exercise improves circulation, posture, and balance. Seek guidance from your physician and exercise physiologist before implementing an exercise routine and learn your capabilities and proper form for all exercise.  Introduction to Yoga  Clinical staff conducted group or individual video education with verbal and written material and  guidebook.  Patient learns about yoga, a discipline of the coming together of mind, breath, and body. The benefits of yoga include improved flexibility, improved range of motion, better posture and core strength, increased lung function, weight loss, and positive self-image. Yoga's heart health benefits include lowered blood pressure, healthier heart rate, decreased cholesterol and triglyceride levels, improved immune function, and reduced stress. Seek guidance from your physician and exercise physiologist before implementing an exercise routine and learn your capabilities and proper form for all exercise.  Medical   Aging: Enhancing Your Quality of Life  Clinical staff conducted group or individual video education with verbal and written material and guidebook.  Patient learns key strategies and recommendations to stay in good physical health and enhance quality of life, such as prevention strategies, having an advocate, securing a Health Care Proxy and Power of Attorney, and keeping a list of medications and system for tracking them. It also discusses how to avoid risk for bone loss.  Biology of Weight Control  Clinical staff conducted group or individual video education with verbal and written material and guidebook.  Patient learns that weight gain occurs because we consume more calories than we burn (eating more, moving less). Even if your body weight is normal, you may have higher ratios of fat compared to muscle mass. Too much body fat puts you at increased risk for cardiovascular disease, heart attack, stroke, type 2 diabetes, and obesity-related cancers. In addition to exercise, following the Pritikin Eating Plan can help reduce your risk.  Decoding Lab Results  Clinical staff conducted group or individual video education with verbal and written material and guidebook.  Patient learns that lab test reflects one measurement whose values change over time and are influenced by many factors,  including medication, stress, sleep, exercise, food, hydration, pre-existing medical conditions, and more. It is recommended to use the knowledge from this video to become more involved with your lab results and evaluate your numbers to speak with your doctor.   Diseases of Our Time - Overview  Clinical staff conducted group or individual video education with verbal and written material and guidebook.  Patient learns that according to the CDC, 50% to 70% of chronic diseases (such as obesity, type 2 diabetes, elevated lipids, hypertension, and heart disease) are avoidable through lifestyle improvements including healthier food choices, listening to satiety cues, and increased physical activity.  Sleep Disorders Clinical staff conducted group or individual video education with verbal and written material and guidebook.  Patient learns how good quality and duration of sleep are important to overall health and well-being. Patient also learns about sleep disorders and how they impact health along with recommendations to address them, including discussing with a physician.  Nutrition  Dining Out - Part 2 Clinical staff conducted group or individual video education with verbal and written material and guidebook.  Patient learns how to plan ahead and communicate in order to maximize their dining experience in a healthy and nutritious manner. Included are recommended food choices based on the type of restaurant the patient is visiting.   Fueling  a Healthy Body  Clinical staff conducted group or individual video education with verbal and written material and guidebook.  There is a strong connection between our food choices and our health. Diseases like obesity and type 2 diabetes are very prevalent and are in large-part due to lifestyle choices. The Pritikin Eating Plan provides plenty of food and hunger-curbing satisfaction. It is easy to follow, affordable, and helps reduce health risks.  Menu Workshop   Clinical staff conducted group or individual video education with verbal and written material and guidebook.  Patient learns that restaurant meals can sabotage health goals because they are often packed with calories, fat, sodium, and sugar. Recommendations include strategies to plan ahead and to communicate with the manager, chef, or server to help order a healthier meal.  Planning Your Eating Strategy  Clinical staff conducted group or individual video education with verbal and written material and guidebook.  Patient learns about the Pritikin Eating Plan and its benefit of reducing the risk of disease. The Pritikin Eating Plan does not focus on calories. Instead, it emphasizes high-quality, nutrient-rich foods. By knowing the characteristics of the foods, we choose, we can determine their calorie density and make informed decisions.  Targeting Your Nutrition Priorities  Clinical staff conducted group or individual video education with verbal and written material and guidebook.  Patient learns that lifestyle habits have a tremendous impact on disease risk and progression. This video provides eating and physical activity recommendations based on your personal health goals, such as reducing LDL cholesterol, losing weight, preventing or controlling type 2 diabetes, and reducing high blood pressure.  Vitamins and Minerals  Clinical staff conducted group or individual video education with verbal and written material and guidebook.  Patient learns different ways to obtain key vitamins and minerals, including through a recommended healthy diet. It is important to discuss all supplements you take with your doctor.   Healthy Mind-Set    Smoking Cessation  Clinical staff conducted group or individual video education with verbal and written material and guidebook.  Patient learns that cigarette smoking and tobacco addiction pose a serious health risk which affects millions of people. Stopping smoking  will significantly reduce the risk of heart disease, lung disease, and many forms of cancer. Recommended strategies for quitting are covered, including working with your doctor to develop a successful plan.  Culinary   Becoming a Set designer conducted group or individual video education with verbal and written material and guidebook.  Patient learns that cooking at home can be healthy, cost-effective, quick, and puts them in control. Keys to cooking healthy recipes will include looking at your recipe, assessing your equipment needs, planning ahead, making it simple, choosing cost-effective seasonal ingredients, and limiting the use of added fats, salts, and sugars.  Cooking - Breakfast and Snacks  Clinical staff conducted group or individual video education with verbal and written material and guidebook.  Patient learns how important breakfast is to satiety and nutrition through the entire day. Recommendations include key foods to eat during breakfast to help stabilize blood sugar levels and to prevent overeating at meals later in the day. Planning ahead is also a key component.  Cooking - Educational psychologist conducted group or individual video education with verbal and written material and guidebook.  Patient learns eating strategies to improve overall health, including an approach to cook more at home. Recommendations include thinking of animal protein as a side on your plate rather than center stage and  focusing instead on lower calorie dense options like vegetables, fruits, whole grains, and plant-based proteins, such as beans. Making sauces in large quantities to freeze for later and leaving the skin on your vegetables are also recommended to maximize your experience.  Cooking - Healthy Salads and Dressing Clinical staff conducted group or individual video education with verbal and written material and guidebook.  Patient learns that vegetables, fruits, whole  grains, and legumes are the foundations of the Pritikin Eating Plan. Recommendations include how to incorporate each of these in flavorful and healthy salads, and how to create homemade salad dressings. Proper handling of ingredients is also covered. Cooking - Soups and [employer] - Soups and Desserts Clinical staff conducted group or individual video education with verbal and written material and guidebook.  Patient learns that Pritikin soups and desserts make for easy, nutritious, and delicious snacks and meal components that are low in sodium, fat, sugar, and calorie density, while high in vitamins, minerals, and filling fiber. Recommendations include simple and healthy ideas for soups and desserts.   Overview     The Pritikin Solution Program Overview Clinical staff conducted group or individual video education with verbal and written material and guidebook.  Patient learns that the results of the Pritikin Program have been documented in more than 100 articles published in peer-reviewed journals, and the benefits include reducing risk factors for (and, in some cases, even reversing) high cholesterol, high blood pressure, type 2 diabetes, obesity, and more! An overview of the three key pillars of the Pritikin Program will be covered: eating well, doing regular exercise, and having a healthy mind-set.  WORKSHOPS  Exercise: Exercise Basics: Building Your Action Plan Clinical staff led group instruction and group discussion with PowerPoint presentation and patient guidebook. To enhance the learning environment the use of posters, models and videos may be added. At the conclusion of this workshop, patients will comprehend the difference between physical activity and exercise, as well as the benefits of incorporating both, into their routine. Patients will understand the FITT (Frequency, Intensity, Time, and Type) principle and how to use it to build an exercise action plan. In addition,  safety concerns and other considerations for exercise and cardiac rehab will be addressed by the presenter. The purpose of this lesson is to promote a comprehensive and effective weekly exercise routine in order to improve patients' overall level of fitness.   Managing Heart Disease: Your Path to a Healthier Heart Clinical staff led group instruction and group discussion with PowerPoint presentation and patient guidebook. To enhance the learning environment the use of posters, models and videos may be added.At the conclusion of this workshop, patients will understand the anatomy and physiology of the heart. Additionally, they will understand how Pritikin's three pillars impact the risk factors, the progression, and the management of heart disease.  The purpose of this lesson is to provide a high-level overview of the heart, heart disease, and how the Pritikin lifestyle positively impacts risk factors.  Exercise Biomechanics Clinical staff led group instruction and group discussion with PowerPoint presentation and patient guidebook. To enhance the learning environment the use of posters, models and videos may be added. Patients will learn how the structural parts of their bodies function and how these functions impact their daily activities, movement, and exercise. Patients will learn how to promote a neutral spine, learn how to manage pain, and identify ways to improve their physical movement in order to promote healthy living. The purpose of this lesson is to  expose patients to common physical limitations that impact physical activity. Participants will learn practical ways to adapt and manage aches and pains, and to minimize their effect on regular exercise. Patients will learn how to maintain good posture while sitting, walking, and lifting.  Balance Training and Fall Prevention  Clinical staff led group instruction and group discussion with PowerPoint presentation and patient guidebook. To  enhance the learning environment the use of posters, models and videos may be added. At the conclusion of this workshop, patients will understand the importance of their sensorimotor skills (vision, proprioception, and the vestibular system) in maintaining their ability to balance as they age. Patients will apply a variety of balancing exercises that are appropriate for their current level of function. Patients will understand the common causes for poor balance, possible solutions to these problems, and ways to modify their physical environment in order to minimize their fall risk. The purpose of this lesson is to teach patients about the importance of maintaining balance as they age and ways to minimize their risk of falling.  WORKSHOPS   Nutrition:  Fueling a Ship broker led group instruction and group discussion with PowerPoint presentation and patient guidebook. To enhance the learning environment the use of posters, models and videos may be added. Patients will review the foundational principles of the Pritikin Eating Plan and understand what constitutes a serving size in each of the food groups. Patients will also learn Pritikin-friendly foods that are better choices when away from home and review make-ahead meal and snack options. Calorie density will be reviewed and applied to three nutrition priorities: weight maintenance, weight loss, and weight gain. The purpose of this lesson is to reinforce (in a group setting) the key concepts around what patients are recommended to eat and how to apply these guidelines when away from home by planning and selecting Pritikin-friendly options. Patients will understand how calorie density may be adjusted for different weight management goals.  Mindful Eating  Clinical staff led group instruction and group discussion with PowerPoint presentation and patient guidebook. To enhance the learning environment the use of posters, models and videos may  be added. Patients will briefly review the concepts of the Pritikin Eating Plan and the importance of low-calorie dense foods. The concept of mindful eating will be introduced as well as the importance of paying attention to internal hunger signals. Triggers for non-hunger eating and techniques for dealing with triggers will be explored. The purpose of this lesson is to provide patients with the opportunity to review the basic principles of the Pritikin Eating Plan, discuss the value of eating mindfully and how to measure internal cues of hunger and fullness using the Hunger Scale. Patients will also discuss reasons for non-hunger eating and learn strategies to use for controlling emotional eating.  Targeting Your Nutrition Priorities Clinical staff led group instruction and group discussion with PowerPoint presentation and patient guidebook. To enhance the learning environment the use of posters, models and videos may be added. Patients will learn how to determine their genetic susceptibility to disease by reviewing their family history. Patients will gain insight into the importance of diet as part of an overall healthy lifestyle in mitigating the impact of genetics and other environmental insults. The purpose of this lesson is to provide patients with the opportunity to assess their personal nutrition priorities by looking at their family history, their own health history and current risk factors. Patients will also be able to discuss ways of prioritizing and modifying the  Pritikin Eating Plan for their highest risk areas  Menu  Clinical staff led group instruction and group discussion with PowerPoint presentation and patient guidebook. To enhance the learning environment the use of posters, models and videos may be added. Using menus brought in from [employer], or printed from [employer], patients will apply the Pritikin dining out guidelines that were presented in the [employer] video. Patients will also be able to practice these guidelines in a variety of provided scenarios. The purpose of this lesson is to provide patients with the opportunity to practice hands-on learning of the Pritikin Dining Out guidelines with actual menus and practice scenarios.  Label Reading Clinical staff led group instruction and group discussion with PowerPoint presentation and patient guidebook. To enhance the learning environment the use of posters, models and videos may be added. Patients will review and discuss the Pritikin label reading guidelines presented in Pritikin's Label Reading Educational series video. Using fool labels brought in from local grocery stores and markets, patients will apply the label reading guidelines and determine if the packaged food meet the Pritikin guidelines. The purpose of this lesson is to provide patients with the opportunity to review, discuss, and practice hands-on learning of the Pritikin Label Reading guidelines with actual packaged food labels. Cooking School  Pritikin's [employer] are designed to teach patients ways to prepare quick, simple, and affordable recipes at home. The importance of nutrition's role in chronic disease risk reduction is reflected in its emphasis in the overall Pritikin program. By learning how to prepare essential core Pritikin Eating Plan recipes, patients will increase control over what they eat; be able to customize the flavor of foods without the use of added salt, sugar, or fat; and improve the quality of the food they consume. By learning a set of core recipes which are easily assembled, quickly prepared, and affordable, patients are more likely to prepare more healthy foods at home. These workshops focus on convenient breakfasts, simple entres, side dishes, and desserts which can be prepared with minimal effort and are consistent with nutrition recommendations for cardiovascular risk reduction. Cooking  [employer] are taught by a Armed forces logistics/support/administrative officer (RD) who has been trained by the [employer]. The chef or RD has a clear understanding of the importance of minimizing - if not completely eliminating - added fat, sugar, and sodium in recipes. Throughout the series of Cooking School Workshop sessions, patients will learn about healthy ingredients and efficient methods of cooking to build confidence in their capability to prepare    Cooking School weekly topics:  Adding Flavor- Sodium-Free  Fast and Healthy Breakfasts  Powerhouse Plant-Based Proteins  Satisfying Salads and Dressings  Simple Sides and Sauces  International Cuisine-Spotlight on the [employer] Zones  Delicious Desserts  Savory Soups  [employer] - Meals in a Astronomer Appetizers and Snacks  Comforting Weekend Breakfasts  One-Pot Wonders   Fast Evening Meals  Landscape architect Your Pritikin Plate  WORKSHOPS   Healthy Mindset (Psychosocial):  Focused Goals, Sustainable Changes Clinical staff led group instruction and group discussion with PowerPoint presentation and patient guidebook. To enhance the learning environment the use of posters, models and videos may be added. Patients will be able to apply effective goal setting strategies to establish at least one personal goal, and then take consistent, meaningful action toward that goal. They will learn to identify common barriers to achieving personal goals and develop strategies to overcome them.  Patients will also gain an understanding of how our mind-set can impact our ability to achieve goals and the importance of cultivating a positive and growth-oriented mind-set. The purpose of this lesson is to provide patients with a deeper understanding of how to set and achieve personal goals, as well as the tools and strategies needed to overcome common obstacles which may arise along the way.  From Head to Heart: The Power of a Healthy  Outlook  Clinical staff led group instruction and group discussion with PowerPoint presentation and patient guidebook. To enhance the learning environment the use of posters, models and videos may be added. Patients will be able to recognize and describe the impact of emotions and mood on physical health. They will discover the importance of self-care and explore self-care practices which may work for them. Patients will also learn how to utilize the 4 C's to cultivate a healthier outlook and better manage stress and challenges. The purpose of this lesson is to demonstrate to patients how a healthy outlook is an essential part of maintaining good health, especially as they continue their cardiac rehab journey.  Healthy Sleep for a Healthy Heart Clinical staff led group instruction and group discussion with PowerPoint presentation and patient guidebook. To enhance the learning environment the use of posters, models and videos may be added. At the conclusion of this workshop, patients will be able to demonstrate knowledge of the importance of sleep to overall health, well-being, and quality of life. They will understand the symptoms of, and treatments for, common sleep disorders. Patients will also be able to identify daytime and nighttime behaviors which impact sleep, and they will be able to apply these tools to help manage sleep-related challenges. The purpose of this lesson is to provide patients with a general overview of sleep and outline the importance of quality sleep. Patients will learn about a few of the most common sleep disorders. Patients will also be introduced to the concept of "sleep hygiene," and discover ways to self-manage certain sleeping problems through simple daily behavior changes. Finally, the workshop will motivate patients by clarifying the links between quality sleep and their goals of heart-healthy living.   Recognizing and Reducing Stress Clinical staff led group instruction and  group discussion with PowerPoint presentation and patient guidebook. To enhance the learning environment the use of posters, models and videos may be added. At the conclusion of this workshop, patients will be able to understand the types of stress reactions, differentiate between acute and chronic stress, and recognize the impact that chronic stress has on their health. They will also be able to apply different coping mechanisms, such as reframing negative self-talk. Patients will have the opportunity to practice a variety of stress management techniques, such as deep abdominal breathing, progressive muscle relaxation, and/or guided imagery.  The purpose of this lesson is to educate patients on the role of stress in their lives and to provide healthy techniques for coping with it.  Learning Barriers/Preferences:  Learning Barriers/Preferences - 05/21/22 1304       Learning Barriers/Preferences   Learning Barriers None    Learning Preferences Computer/Internet;Group Instruction;Individual Instruction;Pictoral;Skilled Demonstration;Verbal Instruction;Written Material             Education Topics:  Knowledge Questionnaire Score:  Knowledge Questionnaire Score - 05/21/22 1305       Knowledge Questionnaire Score   Pre Score 22/24             Core Components/Risk Factors/Patient Goals at Admission:  Personal Goals  and Risk Factors at Admission - 05/21/22 1306       Core Components/Risk Factors/Patient Goals on Admission    Weight Management Yes;Weight Maintenance    Intervention Weight Management: Develop a combined nutrition and exercise program designed to reach desired caloric intake, while maintaining appropriate intake of nutrient and fiber, sodium and fats, and appropriate energy expenditure required for the weight goal.;Weight Management: Provide education and appropriate resources to help participant work on and attain dietary goals.    Expected Outcomes Short Term: Continue  to assess and modify interventions until short term weight is achieved;Long Term: Adherence to nutrition and physical activity/exercise program aimed toward attainment of established weight goal;Understanding recommendations for meals to include 15-35% energy as protein, 25-35% energy from fat, 35-60% energy from carbohydrates, less than 200mg  of dietary cholesterol, 20-35 gm of total fiber daily;Understanding of distribution of calorie intake throughout the day with the consumption of 4-5 meals/snacks;Weight Maintenance: Understanding of the daily nutrition guidelines, which includes 25-35% calories from fat, 7% or less cal from saturated fats, less than 200mg  cholesterol, less than 1.5gm of sodium, & 5 or more servings of fruits and vegetables daily    Hypertension Yes    Intervention Provide education on lifestyle modifcations including regular physical activity/exercise, weight management, moderate sodium restriction and increased consumption of fresh fruit, vegetables, and low fat dairy, alcohol moderation, and smoking cessation.;Monitor prescription use compliance.    Expected Outcomes Short Term: Continued assessment and intervention until BP is < 140/77mm HG in hypertensive participants. < 130/75mm HG in hypertensive participants with diabetes, heart failure or chronic kidney disease.;Long Term: Maintenance of blood pressure at goal levels.             Core Components/Risk Factors/Patient Goals Review:   Goals and Risk Factor Review     Row Name 06/10/22 1551             Core Components/Risk Factors/Patient Goals Review   Personal Goals Review Weight Management/Obesity;Hypertension       Review Matthew [name] is doing well with exercise at intensive cardiac rehab. Vital signs have been stable. Matthew [name] exercises on a regular basis and has been increasing his workloads accordingly       Expected Outcomes Matthew [name] will continue to participate in intensive cardiac rehab for exercise nutrition and  lifestyle modifications                Core Components/Risk Factors/Patient Goals at Discharge (Final Review):   Goals and Risk Factor Review - 06/10/22 1551       Core Components/Risk Factors/Patient Goals Review   Personal Goals Review Weight Management/Obesity;Hypertension    Review Matthew [name] is doing well with exercise at intensive cardiac rehab. Vital signs have been stable. Matthew [name] exercises on a regular basis and has been increasing his workloads accordingly    Expected Outcomes Matthew [name] will continue to participate in intensive cardiac rehab for exercise nutrition and lifestyle modifications             ITP Comments:  ITP Comments     Row Name 05/21/22 1018 06/10/22 1548         ITP Comments Dr. Armanda Magic medical director. Introduction to pritikin education/intensive cardiac rehab. Initial orientation packet reviewed with patient. 30 Day ITP Review. Matthew [name] has good attendance and participation in intensive cardiac rehab.               Comments: See ITP comments.

## 2022-06-10 NOTE — Patient Instructions (Signed)
Medication Instructions:  Your physician has recommended you make the following change in your medication:  1.) change aspirin to 81 mg - one tablet daily 2.) stop amlodipine 3.) stop metoprolol  *If you need a refill on your cardiac medications before your next appointment, please call your pharmacy*   Lab Work: none   Testing/Procedures: none   Follow-Up: At [facility], you and your health needs are our priority.  As part of our continuing mission to provide you with exceptional heart care, we have created designated Provider Care Teams.  These Care Teams include your primary Cardiologist (physician) and Advanced Practice Providers (APPs -  Physician Assistants and Nurse Practitioners) who all work together to provide you with the care you need, when you need it.   Your next appointment:   12 month(s)  Provider:   Verne Carrow, MD

## 2022-06-11 ENCOUNTER — Encounter (HOSPITAL_COMMUNITY)
Admission: RE | Admit: 2022-06-11 | Discharge: 2022-06-11 | Disposition: A | Discharge status: Home / Self Care | Payer: Medicare HMO | Source: Ambulatory Visit | Attending: Cardiovascular Disease | Admitting: Cardiovascular Disease

## 2022-06-11 DIAGNOSIS — Z9889 Other specified postprocedural states: Secondary | ICD-10-CM

## 2022-06-13 ENCOUNTER — Telehealth (HOSPITAL_COMMUNITY): Payer: Self-pay

## 2022-06-13 ENCOUNTER — Encounter (HOSPITAL_COMMUNITY)
Admission: RE | Admit: 2022-06-13 | Discharge: 2022-06-13 | Disposition: A | Discharge status: Home / Self Care | Payer: Medicare HMO | Source: Ambulatory Visit | Attending: Cardiovascular Disease | Admitting: Cardiovascular Disease

## 2022-06-13 DIAGNOSIS — Z9889 Other specified postprocedural states: Secondary | ICD-10-CM

## 2022-06-13 NOTE — Telephone Encounter (Signed)
Before exercise at cardiac rehab, BP 138/104. Rechecked with automatic cuff at 142/100. Patient denies any chest pain, sob. He stated he worked out at home this morning and felt fine, as well. He did state he is no longer taking amlodipine or metoprolol. On recheck, 138/88. We will let him exercise with this new BP and continue to monitor. Sent message to Dr. Clifton James.

## 2022-06-16 ENCOUNTER — Telehealth (HOSPITAL_COMMUNITY): Payer: Self-pay

## 2022-06-16 ENCOUNTER — Encounter (HOSPITAL_COMMUNITY)
Admission: RE | Admit: 2022-06-16 | Discharge: 2022-06-16 | Disposition: A | Discharge status: Home / Self Care | Payer: Medicare HMO | Source: Ambulatory Visit | Attending: Cardiovascular Disease | Admitting: Cardiovascular Disease

## 2022-06-16 DIAGNOSIS — Z9889 Other specified postprocedural states: Secondary | ICD-10-CM | POA: Diagnosis not present

## 2022-06-16 NOTE — Telephone Encounter (Signed)
-----   Message from Kathleene Hazel, MD sent at 06/15/2022 12:46 PM EDT ----- Regarding: RE: THR increase for patient Matthew [name] That is ok with me. Thayer Ohm ----- Message ----- From: Lorin Picket Sent: 06/12/2022   4:12 PM EDT To: Cammy Copa, RN; # Subject: THR increase for patient Matthew [name]        Good afternoon Dr. Clifton James,  I am requesting a THR increase for [facility]. He wants to increase his exercise intensity but is exceeding his THRR of 59-119. Blood pressures with exercise have ranged from 124-158/78-92 and heart rates 97-136. I am requesting an increase to 90% APM (134 bpm). Please let me know if you need any additional information about his progress. Thank you for considering this request.   Best Regards,  Lorin Picket MS, ACSM-CEP, CCRP

## 2022-06-18 ENCOUNTER — Encounter (HOSPITAL_COMMUNITY)
Admission: RE | Admit: 2022-06-18 | Discharge: 2022-06-18 | Disposition: A | Discharge status: Home / Self Care | Payer: Medicare HMO | Source: Ambulatory Visit | Attending: Cardiovascular Disease | Admitting: Cardiovascular Disease

## 2022-06-18 DIAGNOSIS — Z9889 Other specified postprocedural states: Secondary | ICD-10-CM

## 2022-06-18 NOTE — Progress Notes (Signed)
After exercise, BP 154/100, 5 min later 141/93, 5 minutes later, and after 8 oz water, 142/94. Was asymptomatic. Patient stated he does not want to be on medications. Explained to patient that if his BP is elevated after exercise, it can put a strain on his heart valve, kidneys, vessels. Recommended he check BP at home, not just a 8am and 8pm, but at different times during the day, and after any exercise.  He verbalized understanding. He stated he is keeping a BP diary.

## 2022-06-20 ENCOUNTER — Encounter (HOSPITAL_COMMUNITY)
Admission: RE | Admit: 2022-06-20 | Discharge: 2022-06-20 | Disposition: A | Discharge status: Home / Self Care | Payer: Medicare HMO | Source: Ambulatory Visit | Attending: Cardiovascular Disease | Admitting: Cardiovascular Disease

## 2022-06-20 DIAGNOSIS — Z9889 Other specified postprocedural states: Secondary | ICD-10-CM

## 2022-06-23 ENCOUNTER — Encounter (HOSPITAL_COMMUNITY): Payer: Medicare HMO

## 2022-06-25 ENCOUNTER — Encounter (HOSPITAL_COMMUNITY): Payer: Medicare HMO

## 2022-06-25 ENCOUNTER — Telehealth (HOSPITAL_COMMUNITY): Payer: Self-pay

## 2022-06-26 NOTE — Progress Notes (Signed)
Discharge Progress Report  Patient Details  Name: Matthew [name] MRN: [id number] Date of Birth: [birth year] Referring Provider:   Flowsheet Row INTENSIVE CARDIAC REHAB ORIENT from 05/21/2022 in [facility]  Referring Provider Verne Carrow, MD        Number of Visits: 20  Reason for Discharge:  Early Exit:  Personal  Smoking History:  Social History   Tobacco Use  Smoking Status Never  Smokeless Tobacco Never    Diagnosis:  03/10/22 S/P aortic valve replacement  ADL [facility]:   Initial Exercise Prescription:  Initial Exercise Prescription - 05/21/22 1300       Date of Initial Exercise RX and Referring Provider   Date 05/21/22    Referring Provider Verne Carrow, MD    Expected Discharge Date 08/01/22      Bike   Level 3    Watts 40    Minutes 15    METs 3.5      Recumbant Elliptical   Level 3    RPM 65    Watts 95    Minutes 15    METs 3.5      Prescription Details   Frequency (times per week) 3    Duration Progress to 30 minutes of continuous aerobic without signs/symptoms of physical distress      Intensity   THRR 40-80% of Max Heartrate 59-119    Ratings of Perceived Exertion 11-13    Perceived Dyspnea 0-4      Progression   Progression Continue progressive overload as per policy without signs/symptoms or physical distress.      Resistance Training   Training Prescription Yes    Weight 4    Reps 10-15             Discharge Exercise Prescription (Final Exercise Prescription Changes):  Exercise Prescription Changes - 06/20/22 1437       Response to Exercise   Blood Pressure (Admit) 134/96    Blood Pressure (Exercise) 156/86    Blood Pressure (Exit) 114/80    Heart Rate (Admit) 85 bpm    Heart Rate (Exercise) 137 bpm    Heart Rate (Exit) 92 bpm    Rating of Perceived Exertion (Exercise) 10    Symptoms None    Comments Pt's last day to exercise in the CRP2 program     Duration Continue with 30 min of aerobic exercise without signs/symptoms of physical distress.    Intensity THRR unchanged      Progression   Progression Continue to progress workloads to maintain intensity without signs/symptoms of physical distress.    Average METs 7.1      Resistance Training   Training Prescription Yes    Weight 5 lbs    Reps 10-15    Time 10 Minutes      Interval Training   Interval Training No      Bike   Level 5    Watts 85    Minutes 15    METs 8.5      Recumbant Elliptical   Level 4    RPM 75    Watts 128    Minutes 15    METs 5.7             Functional Capacity:  6 Minute Walk     Row Name 05/21/22 1259         6 Minute Walk   Phase Initial     Distance 2040 feet  Walk Time 6 minutes     # of Rest Breaks 0     MPH 3.86     METS 4.16     RPE 11     Perceived Dyspnea  0     VO2 Peak 14.6     Symptoms No     Resting HR 61 bpm     Resting BP 120/82     Resting Oxygen Saturation  98 %     Exercise Oxygen Saturation  during 6 min walk 98 %     Max Ex. HR 101 bpm     Max Ex. BP 136/80     2 Minute Post BP 110/78              Psychological, QOL, Others - Outcomes: PHQ 2/9:    06/04/2022    8:27 AM 05/21/2022   12:59 PM 10/15/2021    8:58 AM 09/17/2021    9:53 AM 04/11/2021   10:36 AM  Depression screen PHQ 2/9  Decreased Interest 0 0 0 0 0  Down, Depressed, Hopeless 0 0 0 0 0  PHQ - 2 Score 0 0 0 0 0  Altered sleeping 0 0 0 0   Tired, decreased energy 0 0 0 0   Change in appetite 0 0 0 0   Feeling bad or failure about yourself  0 0 0 0   Trouble concentrating 0 0 0 0   Moving slowly or fidgety/restless 0 0 0 0   Suicidal thoughts 0 0 0 0   PHQ-9 Score 0 0 0 0   Difficult doing work/chores   Not difficult at all Not difficult at all     Quality of Life:  Quality of Life - 05/21/22 1304       Quality of Life   Select Quality of Life      Quality of Life Scores   Health/Function Pre 27.33 %     Socioeconomic Pre 29.29 %    Psych/Spiritual Pre 29.64 %    Family Pre 28.8 %    GLOBAL Pre 28.43 %             Personal Goals: Goals established at orientation with interventions provided to work toward goal.  Personal Goals and Risk Factors at Admission - 05/21/22 1306       Core Components/Risk Factors/Patient Goals on Admission    Weight Management Yes;Weight Maintenance    Intervention Weight Management: Develop a combined nutrition and exercise program designed to reach desired caloric intake, while maintaining appropriate intake of nutrient and fiber, sodium and fats, and appropriate energy expenditure required for the weight goal.;Weight Management: Provide education and appropriate resources to help participant work on and attain dietary goals.    Expected Outcomes Short Term: Continue to assess and modify interventions until short term weight is achieved;Long Term: Adherence to nutrition and physical activity/exercise program aimed toward attainment of established weight goal;Understanding recommendations for meals to include 15-35% energy as protein, 25-35% energy from fat, 35-60% energy from carbohydrates, less than 200mg  of dietary cholesterol, 20-35 gm of total fiber daily;Understanding of distribution of calorie intake throughout the day with the consumption of 4-5 meals/snacks;Weight Maintenance: Understanding of the daily nutrition guidelines, which includes 25-35% calories from fat, 7% or less cal from saturated fats, less than 200mg  cholesterol, less than 1.5gm of sodium, & 5 or more servings of fruits and vegetables daily    Hypertension Yes    Intervention Provide education on lifestyle modifcations including  regular physical activity/exercise, weight management, moderate sodium restriction and increased consumption of fresh fruit, vegetables, and low fat dairy, alcohol moderation, and smoking cessation.;Monitor prescription use compliance.    Expected Outcomes Short Term:  Continued assessment and intervention until BP is < 140/50mm HG in hypertensive participants. < 130/21mm HG in hypertensive participants with diabetes, heart failure or chronic kidney disease.;Long Term: Maintenance of blood pressure at goal levels.              Personal Goals Discharge:  Goals and Risk Factor Review     Row Name 06/10/22 1551 06/26/22 0854           Core Components/Risk Factors/Patient Goals Review   Personal Goals Review Weight Management/Obesity;Hypertension Weight Management/Obesity;Hypertension      Review Saahir is doing well with exercise at intensive cardiac rehab. Vital signs have been stable. Sahir exercises on a regular basis and has been increasing his workloads accordingly Doroteo stopped participating in cardiac rehab on 06/20/22 and decided not to return.      Expected Outcomes Booth will continue to participate in intensive cardiac rehab for exercise nutrition and lifestyle modifications Gaurav will continue to  exercise nutrition and lifestyle modifications on his own               Exercise Goals and Review:  Exercise Goals     Row Name 05/21/22 1303             Exercise Goals   Increase Physical Activity Yes       Intervention Provide advice, education, support and counseling about physical activity/exercise needs.;Develop an individualized exercise prescription for aerobic and resistive training based on initial evaluation findings, risk stratification, comorbidities and participant's personal goals.       Expected Outcomes Short Term: Attend rehab on a regular basis to increase amount of physical activity.;Long Term: Exercising regularly at least 3-5 days a week.;Long Term: Add in home exercise to make exercise part of routine and to increase amount of physical activity.       Increase Strength and Stamina Yes       Intervention Provide advice, education, support and counseling about physical activity/exercise needs.;Develop an  individualized exercise prescription for aerobic and resistive training based on initial evaluation findings, risk stratification, comorbidities and participant's personal goals.       Expected Outcomes Short Term: Increase workloads from initial exercise prescription for resistance, speed, and METs.;Short Term: Perform resistance training exercises routinely during rehab and add in resistance training at home;Long Term: Improve cardiorespiratory fitness, muscular endurance and strength as measured by increased METs and functional capacity ( )       Able to understand and use rate of perceived exertion (RPE) scale Yes       Intervention Provide education and explanation on how to use RPE scale       Expected Outcomes Short Term: Able to use RPE daily in rehab to express subjective intensity level;Long Term:  Able to use RPE to guide intensity level when exercising independently       Knowledge and understanding of Target Heart Rate Range (THRR) Yes       Intervention Provide education and explanation of THRR including how the numbers were predicted and where they are located for reference       Expected Outcomes Short Term: Able to use daily as guideline for intensity in rehab;Long Term: Able to use THRR to govern intensity when exercising independently;Short Term: Able to state/look up THRR  Understanding of Exercise Prescription Yes       Intervention Provide education, explanation, and written materials on patient's individual exercise prescription       Expected Outcomes Short Term: Able to explain program exercise prescription;Long Term: Able to explain home exercise prescription to exercise independently                Exercise Goals Re-Evaluation:  Exercise Goals Re-Evaluation     Row Name 05/26/22 1641             Exercise Goal Re-Evaluation   Exercise Goals Review Increase Physical Activity;Increase Strength and Stamina;Able to understand and use rate of perceived  exertion (RPE) scale;Knowledge and understanding of Target Heart Rate Range (THRR);Understanding of Exercise Prescription       Comments Pt's first day in the CRP2 program. Pt understnads the RPE scale, THRR and exercise Rx.       Expected Outcomes Will continue to monitor patient and progress exercise workloads as tolerated.                Nutrition & Weight - Outcomes:  Pre Biometrics - 05/21/22 1257       Pre Biometrics   Waist Circumference 35.5 inches    Hip Circumference 42 inches    Waist to Hip Ratio 0.85 %    Triceps Skinfold 15 mm    % Body Fat 25.6 %    Grip Strength 48 kg    Flexibility 11.5 in    Single Leg Stand 4.68 seconds              Nutrition:  Nutrition Therapy & Goals - 05/26/22 1334       Nutrition Therapy   Diet Heart Healthy Diet      Personal Nutrition Goals   Nutrition Goal Patient to identify strategies for reducing cardiovascular risk by attending the [facility] education and nutrition series weekly.    Personal Goal #2 Patient to improve diet quality by using the plate method as a guide for meal planning to include lean protein/plant protein, fruits, vegetables, whole grains, nonfat dairy as part of a well-balanced diet.    Comments Mr. Portis reports eating a wide variety foods of foods and normal/standard eating patterns. He denies nutrition questions/concerns at this time. Patient will benefit from participation in intensive cardiac rehab for nutrition, exercise, and lifestyle modification.      Intervention Plan   Intervention Prescribe, educate and counsel regarding individualized specific dietary modifications aiming towards targeted core components such as weight, hypertension, lipid management, diabetes, heart failure and other comorbidities.;Nutrition handout(s) given to patient.    Expected Outcomes Short Term Goal: Understand basic principles of dietary content, such as calories, fat, sodium, cholesterol and nutrients.;Long Term  Goal: Adherence to prescribed nutrition plan.             Nutrition Discharge:  Nutrition Assessments - 06/26/22 1115       Rate Your Plate Scores   Pre Score 60             Education Questionnaire Score:  Knowledge Questionnaire Score - 05/21/22 1305       Knowledge Questionnaire Score   Pre Score 22/24             Kalven attended 20 exercise and education classes between 05/21/22-06/20/22. Revell did well with exercise. Faris did have some intermittent BP variations and elevations as his amlodipine and metoprolol was discontinued. Kamal decided to continue exercise on his own at home as he  has been doing and has been discharged from cardiac rehab as requested. Thayer Headings RN BSN

## 2022-06-27 ENCOUNTER — Encounter (HOSPITAL_COMMUNITY): Payer: Medicare HMO

## 2022-06-30 ENCOUNTER — Encounter (HOSPITAL_COMMUNITY): Payer: Medicare HMO

## 2022-06-30 DIAGNOSIS — M13842 Other specified arthritis, left hand: Secondary | ICD-10-CM | POA: Diagnosis not present

## 2022-06-30 DIAGNOSIS — M79645 Pain in left finger(s): Secondary | ICD-10-CM | POA: Diagnosis not present

## 2022-06-30 DIAGNOSIS — G5682 Other specified mononeuropathies of left upper limb: Secondary | ICD-10-CM | POA: Diagnosis not present

## 2022-07-02 ENCOUNTER — Encounter (HOSPITAL_COMMUNITY): Payer: Medicare HMO

## 2022-07-02 DIAGNOSIS — G5682 Other specified mononeuropathies of left upper limb: Secondary | ICD-10-CM | POA: Diagnosis not present

## 2022-07-04 ENCOUNTER — Encounter (HOSPITAL_COMMUNITY): Payer: Medicare HMO

## 2022-07-07 ENCOUNTER — Encounter (HOSPITAL_COMMUNITY): Payer: Medicare HMO

## 2022-07-09 ENCOUNTER — Encounter (HOSPITAL_COMMUNITY): Payer: Medicare HMO

## 2022-07-11 ENCOUNTER — Encounter (HOSPITAL_COMMUNITY): Payer: Medicare HMO

## 2022-07-14 ENCOUNTER — Encounter (HOSPITAL_COMMUNITY): Payer: Medicare HMO

## 2022-07-16 ENCOUNTER — Encounter (HOSPITAL_COMMUNITY): Payer: Medicare HMO

## 2022-07-18 ENCOUNTER — Encounter (HOSPITAL_COMMUNITY): Payer: Medicare HMO

## 2022-07-21 ENCOUNTER — Encounter (HOSPITAL_COMMUNITY): Payer: Medicare HMO

## 2022-07-23 ENCOUNTER — Encounter (HOSPITAL_COMMUNITY): Payer: Medicare HMO

## 2022-07-25 ENCOUNTER — Encounter (HOSPITAL_COMMUNITY): Payer: Medicare HMO

## 2022-07-28 ENCOUNTER — Encounter (HOSPITAL_COMMUNITY): Payer: Medicare HMO

## 2022-07-30 ENCOUNTER — Encounter (HOSPITAL_COMMUNITY): Payer: Medicare HMO

## 2022-08-01 ENCOUNTER — Encounter (HOSPITAL_COMMUNITY): Payer: Medicare HMO

## 2022-08-03 IMAGING — MR MR ABDOMEN WO/W CM
12 of 18 series · 28 of 48 positions shown · IV contrast (18 ML MULTIHANCE)
Comparison: None.

CLINICAL DATA: Liver lesion evaluation

EXAM:
MRI ABDOMEN WITHOUT AND WITH CONTRAST
TECHNIQUE: Multiplanar multisequence MR imaging of the abdomen was performed
both before and after the administration of intravenous contrast.
CONTRAST:  18mL MULTIHANCE GADOBENATE DIMEGLUMINE 529 MG/ML IV SOLN

[Series 3: cor haste · coronal · 5.0mm · 0.68mm/px · 2 of 35 slices shown]
[im 1/35]
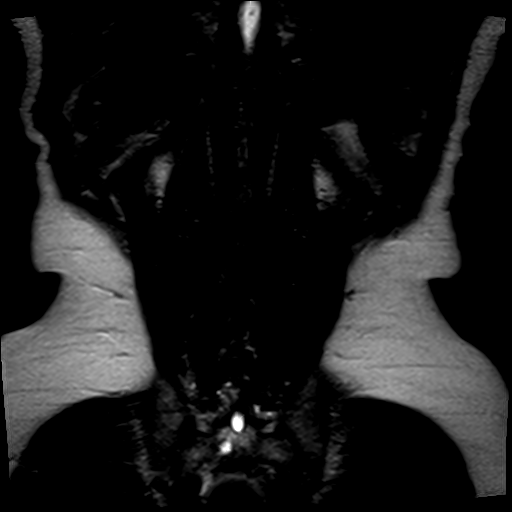
[im 35/35]
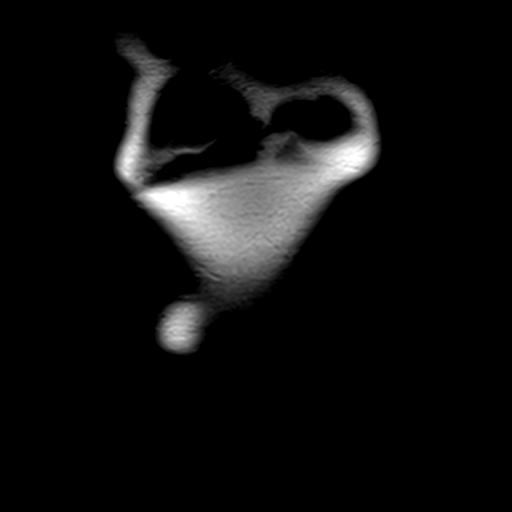

[Series 4: axial haste · axial · 6.0mm · 0.68mm/px · z∈[-83,+155]mm · 2 of 37 slices shown]
[im 1/37]
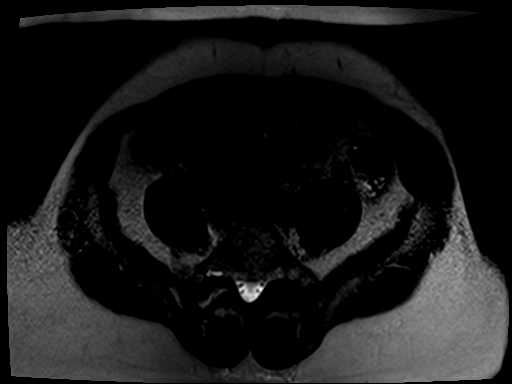
[im 37/37]
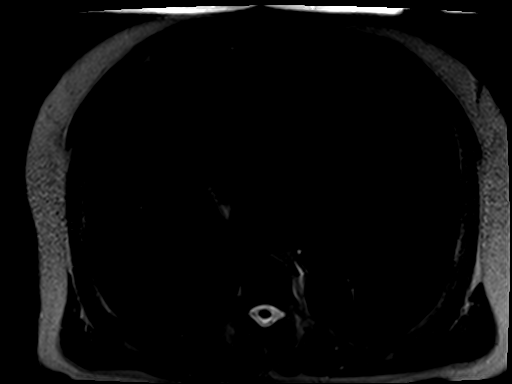

[Series 5: T1 · axial · 6.0mm · 0.68mm/px · z∈[-47,+164]mm · 4 of 66 slices shown]
[im 1/66]
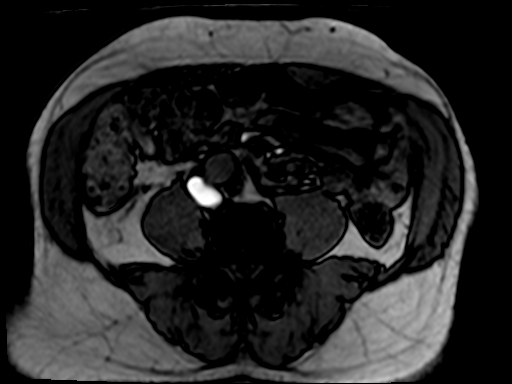
[im 22/66]
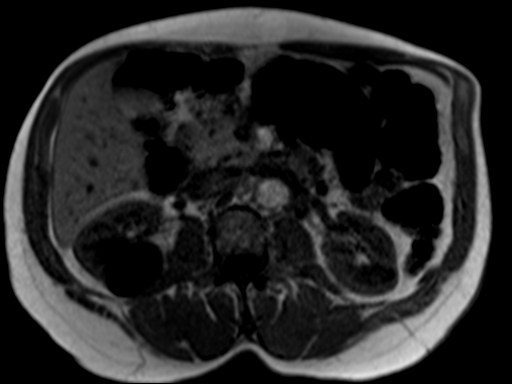
[im 44/66]
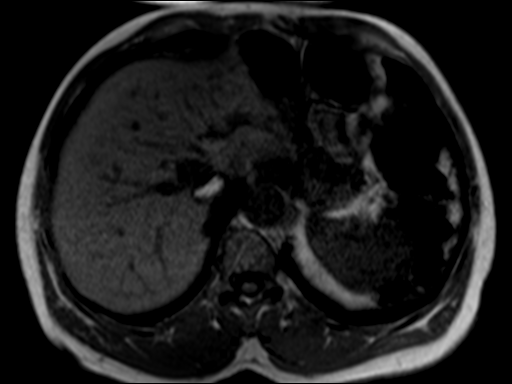
[im 66/66]
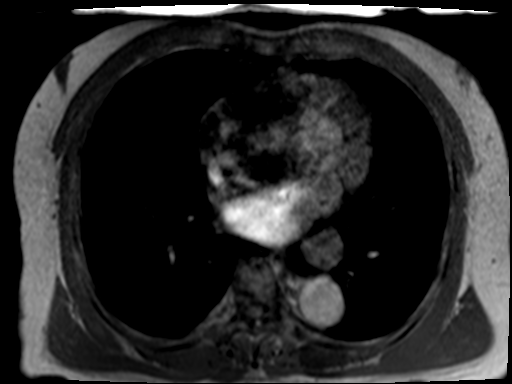

[Series 6: bSSFP · axial · 4.0mm · 0.68mm/px · z∈[-71,-47]mm · 2 of 7 slices shown (1 of 2)]
[im 1/7]
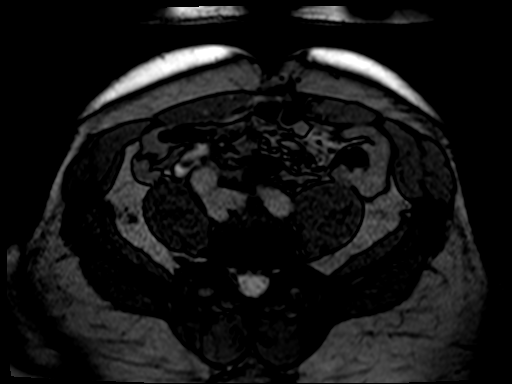
[im 7/7]
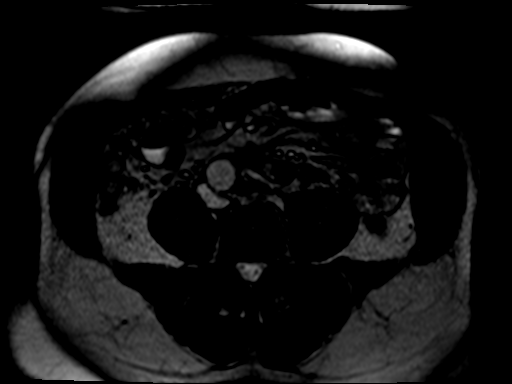

[Series 7: bSSFP · axial · 4.0mm · 0.68mm/px · z∈[-71,+169]mm · 2 of 61 slices shown (2 of 2)]
[im 1/61]
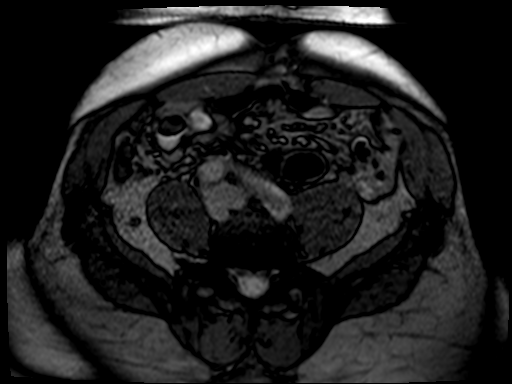
[im 61/61]
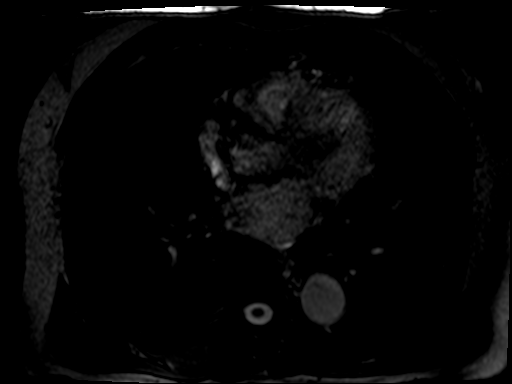

[Series 8: T2 fat-sat · axial · 6.0mm · 1.09mm/px · 1 of 34 slices shown]
[im 1/34]
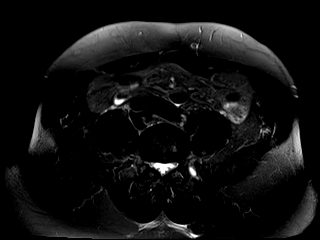

[Series 9: ep2d_diff_b50_500_800_p2_trig · axial · 6.0mm · 1.82mm/px · z∈[-68,+184]mm · 4 of 108 slices shown]
[im 1/108]
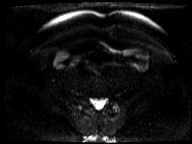
[im 36/108]
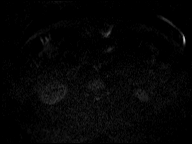
[im 72/108]
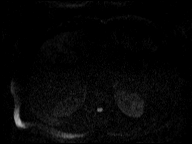
[im 108/108]
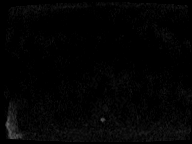

[Series 10: ep2d_diff_b50_500_800_p2_trig_adc · axial · 6.0mm · 1.82mm/px · 1 of 36 slices shown]
[im 1/36]
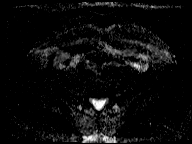

[Series 11: T1 dynamic · axial · non-contrast · 2.5mm · 0.74mm/px · z∈[-51,+166]mm · 3 of 88 slices shown]
[im 1/88]
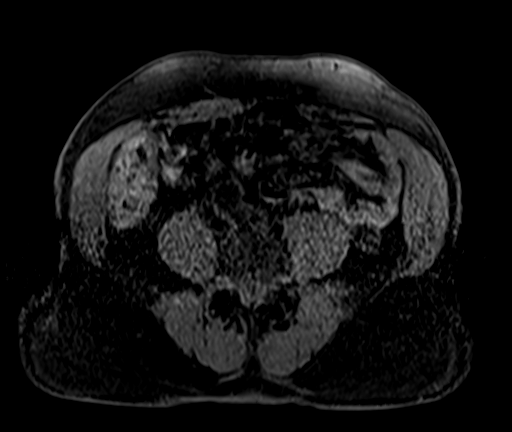
[im 44/88]
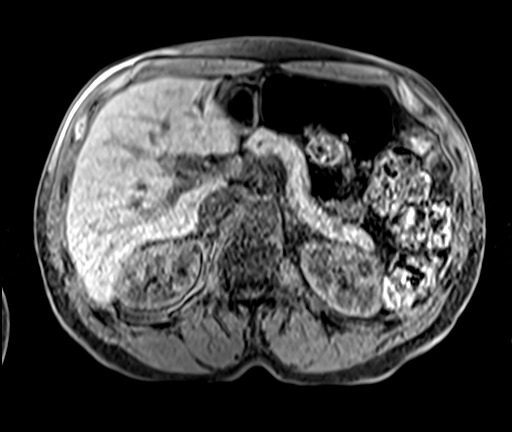
[im 88/88]
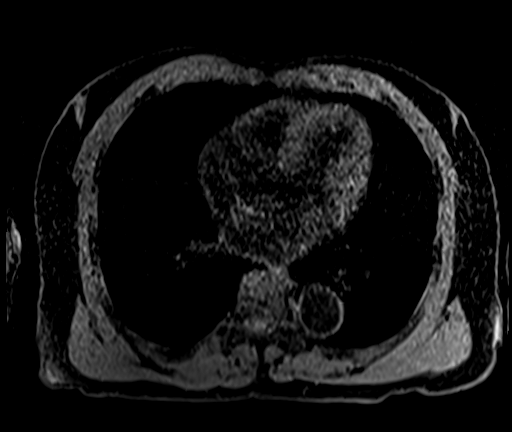

[Series 12: T1 dynamic post-contrast · axial · 2.5mm · 0.74mm/px · z∈[-51,+166]mm · 3 of 88 slices shown (1 of 3)]
[im 1/88]
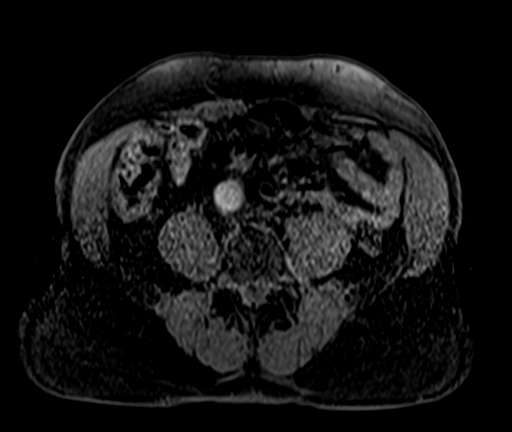
[im 44/88]
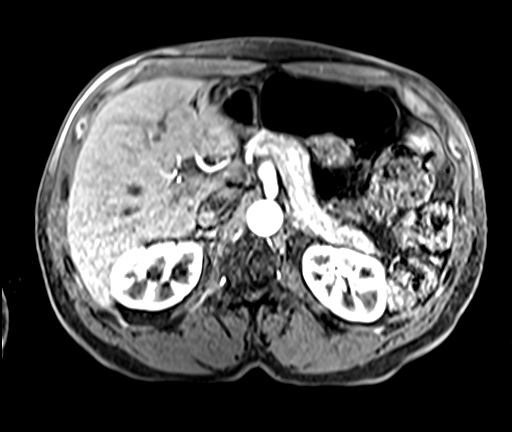
[im 88/88]
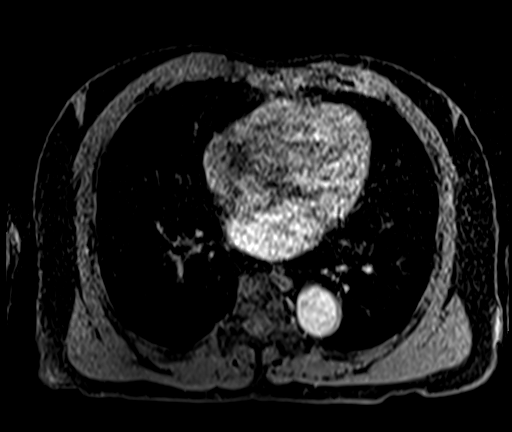

[Series 13: T1 dynamic post-contrast · axial · 2.5mm · 0.74mm/px · z∈[-51,+166]mm · 3 of 88 slices shown (2 of 3)]
[im 1/88]
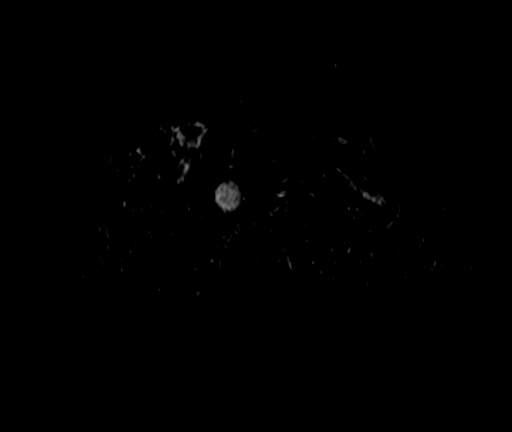
[im 44/88]
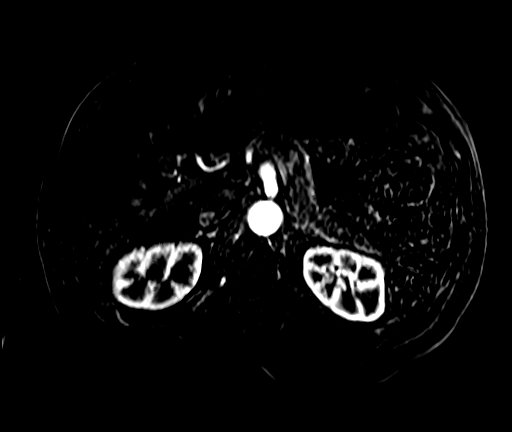
[im 88/88]
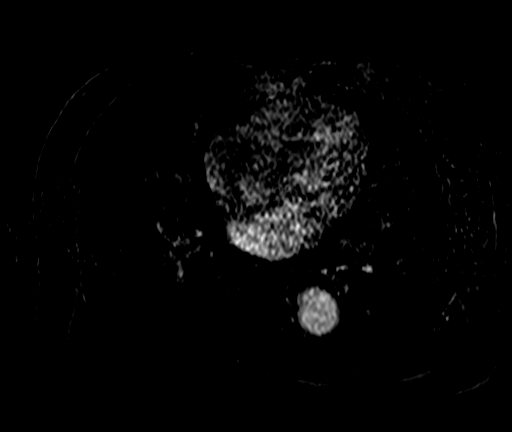

[Series 14: T1 dynamic post-contrast · axial · 2.5mm · 0.74mm/px · 1 of 88 slices shown (3 of 3)]
[im 1/88]
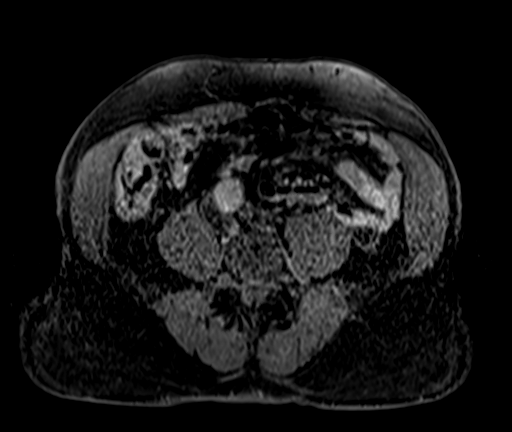

[28 of 48 positions shown; findings below may reference images not displayed]

FINDINGS: Lower chest: No acute findings.

Hepatobiliary: Liver is normal in size and contour. No evidence of
hepatic steatosis. 2.6 x 1.7 cm hyperintense T2 signal mass near the
hilum of the liver just anterior to the IVC which demonstrates early
peripheral discontinuous enhancement with progressive filling
consistent with hemangioma. A few additional small simple hepatic
cysts scattered throughout the liver measuring up to 11 mm near the
dome of the right hepatic lobe. Gallbladder appears normal. No
biliary ductal dilatation identified.

Pancreas: No mass, inflammatory changes, or other parenchymal
abnormality identified.

Spleen:  Within normal limits in size and appearance.

Adrenals/Urinary Tract: Adrenal glands appear within normal limits.
Symmetric perfusion of the kidneys. Multiple renal cortical cysts
identified bilaterally measuring up to 4.5 cm on the right. No
hydronephrosis.

Stomach/Bowel: Visualized portions within the abdomen are
unremarkable.

Vascular/Lymphatic: No pathologically enlarged lymph nodes
identified. No abdominal aortic aneurysm demonstrated.

Other:  No ascites.

Musculoskeletal: No suspicious bony lesions visualized.
IMPRESSION: 1. Hepatic hemangioma and several small hepatic cysts.
2. Bilateral renal cortical cysts.

## 2022-08-04 ENCOUNTER — Ambulatory Visit: Payer: Medicare HMO | Admitting: Cardiovascular Disease

## 2022-08-21 ENCOUNTER — Encounter (INDEPENDENT_AMBULATORY_CARE_PROVIDER_SITE_OTHER): Payer: Self-pay

## 2022-08-22 ENCOUNTER — Other Ambulatory Visit: Payer: Self-pay | Admitting: Oncology

## 2022-08-22 DIAGNOSIS — Z006 Encounter for examination for normal comparison and control in clinical research program: Secondary | ICD-10-CM

## 2022-08-26 ENCOUNTER — Other Ambulatory Visit (HOSPITAL_COMMUNITY)
Admission: RE | Admit: 2022-08-26 | Discharge: 2022-08-26 | Disposition: A | Discharge status: Home / Self Care | Payer: Medicare HMO | Attending: Oncology | Admitting: Oncology

## 2022-08-26 DIAGNOSIS — Z006 Encounter for examination for normal comparison and control in clinical research program: Secondary | ICD-10-CM | POA: Insufficient documentation

## 2022-09-06 LAB — GENECONNECT MOLECULAR SCREEN: Genetic Analysis Overall Interpretation: NEGATIVE

## 2022-09-24 ENCOUNTER — Encounter: Payer: Self-pay | Admitting: Family Medicine

## 2022-09-24 ENCOUNTER — Ambulatory Visit (INDEPENDENT_AMBULATORY_CARE_PROVIDER_SITE_OTHER): Payer: Medicare HMO | Admitting: Family Medicine

## 2022-09-24 VITALS — BP 124/82 | HR 58 | Temp 97.7°F | Ht 68.0 in | Wt 182.2 lb

## 2022-09-24 DIAGNOSIS — Z8546 Personal history of malignant neoplasm of prostate: Secondary | ICD-10-CM

## 2022-09-24 DIAGNOSIS — N529 Male erectile dysfunction, unspecified: Secondary | ICD-10-CM

## 2022-09-24 DIAGNOSIS — G5632 Lesion of radial nerve, left upper limb: Secondary | ICD-10-CM

## 2022-09-24 DIAGNOSIS — R739 Hyperglycemia, unspecified: Secondary | ICD-10-CM | POA: Diagnosis not present

## 2022-09-24 DIAGNOSIS — I351 Nonrheumatic aortic (valve) insufficiency: Secondary | ICD-10-CM

## 2022-09-24 DIAGNOSIS — G5630 Lesion of radial nerve, unspecified upper limb: Secondary | ICD-10-CM | POA: Insufficient documentation

## 2022-09-24 DIAGNOSIS — I10 Essential (primary) hypertension: Secondary | ICD-10-CM | POA: Diagnosis not present

## 2022-09-24 DIAGNOSIS — Z23 Encounter for immunization: Secondary | ICD-10-CM

## 2022-09-24 DIAGNOSIS — J4520 Mild intermittent asthma, uncomplicated: Secondary | ICD-10-CM | POA: Diagnosis not present

## 2022-09-24 DIAGNOSIS — M1A9XX0 Chronic gout, unspecified, without tophus (tophi): Secondary | ICD-10-CM

## 2022-09-24 DIAGNOSIS — Z952 Presence of prosthetic heart valve: Secondary | ICD-10-CM

## 2022-09-24 LAB — LIPID PANEL
Cholesterol: 158 mg/dL (ref 0–200)
HDL: 67.7 mg/dL (ref >39.00)
LDL Cholesterol: 76 mg/dL (ref 0–99)
NonHDL: 90.58
Total CHOL/HDL Ratio: 2
Triglycerides: 71 mg/dL (ref 0.0–149.0)
VLDL: 14.2 mg/dL (ref 0.0–40.0)

## 2022-09-24 LAB — HEPATIC FUNCTION PANEL
ALT: 13 U/L (ref 0–53)
AST: 22 U/L (ref 0–37)
Albumin: 4.3 g/dL (ref 3.5–5.2)
Alkaline Phosphatase: 75 U/L (ref 39–117)
Bilirubin, Direct: 0.1 mg/dL (ref 0.0–0.3)
Total Bilirubin: 0.5 mg/dL (ref 0.2–1.2)
Total Protein: 7.3 g/dL (ref 6.0–8.3)

## 2022-09-24 LAB — CBC WITH DIFFERENTIAL/PLATELET
Basophils Absolute: 0 10*3/uL (ref 0.0–0.1)
Basophils Relative: 1 % (ref 0.0–3.0)
Eosinophils Absolute: 0.1 10*3/uL (ref 0.0–0.7)
Eosinophils Relative: 3.9 % (ref 0.0–5.0)
HCT: 42.4 % (ref 39.0–52.0)
Hemoglobin: 13.7 g/dL (ref 13.0–17.0)
Lymphocytes Relative: 53.5 % — ABNORMAL HIGH (ref 12.0–46.0)
Lymphs Abs: 2 10*3/uL (ref 0.7–4.0)
MCHC: 32.3 g/dL (ref 30.0–36.0)
MCV: 88.4 fl (ref 78.0–100.0)
Monocytes Absolute: 0.4 10*3/uL (ref 0.1–1.0)
Monocytes Relative: 10.5 % (ref 3.0–12.0)
Neutro Abs: 1.1 10*3/uL — ABNORMAL LOW (ref 1.4–7.7)
Neutrophils Relative %: 31.1 % — ABNORMAL LOW (ref 43.0–77.0)
Platelets: 195 10*3/uL (ref 150.0–400.0)
RBC: 4.8 Mil/uL (ref 4.22–5.81)
RDW: 16 % — ABNORMAL HIGH (ref 11.5–15.5)
WBC: 3.7 10*3/uL — ABNORMAL LOW (ref 4.0–10.5)

## 2022-09-24 LAB — BASIC METABOLIC PANEL WITH GFR
BUN: 15 mg/dL (ref 6–23)
CO2: 29 meq/L (ref 19–32)
Calcium: 9.6 mg/dL (ref 8.4–10.5)
Chloride: 102 meq/L (ref 96–112)
Creatinine, Ser: 0.92 mg/dL (ref 0.40–1.50)
GFR: 83.35 mL/min (ref >60.00)
Glucose, Bld: 91 mg/dL (ref 70–99)
Potassium: 4.5 meq/L (ref 3.5–5.1)
Sodium: 139 meq/L (ref 135–145)

## 2022-09-24 LAB — HEMOGLOBIN A1C: Hgb A1c MFr Bld: 5.6 % (ref 4.6–6.5)

## 2022-09-24 LAB — TSH: TSH: 1.76 u[IU]/mL (ref 0.35–5.50)

## 2022-09-24 MED ORDER — AMLODIPINE BESYLATE 5 MG PO TABS: 5.0000 mg | ORAL_TABLET | Freq: Every day | ORAL | 3 refills | Status: DC

## 2022-09-24 NOTE — Progress Notes (Signed)
Subjective:    Patient ID: Matthew [name], male    DOB: 1950/07/09, 72 y.o.   MRN: [id number]  HPI Here to follow up on issues. He feels well in general. His BP at home is consistently in the 120's over 80's. He sees Dr. Estrellita Ludwig for his hx of prostate cancer. 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 He has not had asthma or gout flares for years.    Review of Systems  Constitutional: Negative.   HENT: Negative.    Eyes: Negative.   Respiratory: Negative.    Cardiovascular: Negative.   Gastrointestinal: Negative.   Genitourinary: Negative.   Musculoskeletal: Negative.   Skin: Negative.   Neurological: Negative.   Psychiatric/Behavioral: Negative.         Objective:   Physical Exam Constitutional:      General: He is not in acute distress.    Appearance: Normal appearance. He is well-developed. He is not diaphoretic.  HENT:     Head: Normocephalic and atraumatic.     Right Ear: External ear normal.     Left Ear: External ear normal.     Nose: Nose normal.     Mouth/Throat:     Pharynx: No oropharyngeal exudate.  Eyes:     General: No scleral icterus.       Right eye: No discharge.        Left eye: No discharge.     Conjunctiva/sclera: Conjunctivae normal.     Pupils: Pupils are equal, round, and reactive to light.  Neck:     Thyroid: No thyromegaly.     Vascular: No JVD.     Trachea: No tracheal deviation.  Cardiovascular:     Rate and Rhythm: Normal rate and regular rhythm.     Pulses: Normal pulses.     Heart sounds: Normal heart sounds. No murmur heard.    No friction rub. No gallop.  Pulmonary:     Effort: Pulmonary effort is normal. No respiratory distress.     Breath sounds: Normal breath sounds. No wheezing or rales.  Chest:     Chest wall: No tenderness.  Abdominal:     General: Bowel sounds are normal. There is no distension.     Palpations: Abdomen is soft. There is no mass.      Tenderness: There is no abdominal tenderness. There is no guarding or rebound.  Genitourinary:    Penis: No tenderness.   Musculoskeletal:        General: No tenderness. Normal range of motion.     Cervical back: Neck supple.  Lymphadenopathy:     Cervical: No cervical adenopathy.  Skin:    General: Skin is warm and dry.     Coloration: Skin is not pale.     Findings: No erythema or rash.  Neurological:     General: No focal deficit present.     Mental Status: He is alert and oriented to person, place, and time.     Cranial Nerves: No cranial nerve deficit.     Motor: No abnormal muscle tone.     Coordination: Coordination normal.     Deep Tendon Reflexes: Reflexes are normal and symmetric. Reflexes normal.  Psychiatric:        Mood and Affect: Mood normal.        Behavior: Behavior normal.        Thought Content: Thought content normal.  Judgment: Judgment normal.           Assessment & Plan:  His HTN is well controlled. He will follow up with Orthopedics and Urology as above. His asthma and gout are stable. Get fasting labs for lipids, etc. We spent a total of (31   ) minutes reviewing records and discussing these issues.  Gershon Crane, MD

## 2022-09-24 NOTE — Addendum Note (Signed)
Addended by: Carola Rhine on: 09/24/2022 10:17 AM   Modules accepted: Orders

## 2022-10-01 DIAGNOSIS — Z954 Presence of other heart-valve replacement: Secondary | ICD-10-CM | POA: Diagnosis not present

## 2022-10-01 DIAGNOSIS — G5682 Other specified mononeuropathies of left upper limb: Secondary | ICD-10-CM | POA: Diagnosis not present

## 2022-10-01 DIAGNOSIS — M79642 Pain in left hand: Secondary | ICD-10-CM | POA: Diagnosis not present

## 2022-10-29 DIAGNOSIS — H5211 Myopia, right eye: Secondary | ICD-10-CM | POA: Diagnosis not present

## 2022-10-29 DIAGNOSIS — H52223 Regular astigmatism, bilateral: Secondary | ICD-10-CM | POA: Diagnosis not present

## 2022-10-29 DIAGNOSIS — H524 Presbyopia: Secondary | ICD-10-CM | POA: Diagnosis not present

## 2023-01-05 DIAGNOSIS — G5682 Other specified mononeuropathies of left upper limb: Secondary | ICD-10-CM | POA: Diagnosis not present

## 2023-03-09 ENCOUNTER — Telehealth: Payer: Self-pay | Admitting: Family Medicine

## 2023-03-09 NOTE — Telephone Encounter (Signed)
 Pt states he is currently taking amlodipine 10MG , however amlodipine 5MG  was called in for pt for 90 days. He is requesting if amlodipine 10MG  could be called in for 90 days. Please advise at: [phone].

## 2023-03-10 MED ORDER — AMLODIPINE BESYLATE 10 MG PO TABS: 10.0000 mg | ORAL_TABLET | Freq: Every day | ORAL | 3 refills | Status: AC

## 2023-03-10 NOTE — Telephone Encounter (Signed)
 I changed this to 10 mg daily

## 2023-03-10 NOTE — Telephone Encounter (Signed)
 What dose of amlodipine should pt be on, pt chart stated that he should be taking amlodipine 5 mg daily. Please advise

## 2023-03-11 NOTE — Telephone Encounter (Signed)
Pt notified of Dr Clent Ridges advise, verbalized understanding

## 2023-08-09 NOTE — Progress Notes (Unsigned)
 No chief complaint on file.  History of Present Illness: 73 yo male with history of prostate cancer and aortic valve insufficiency s/p surgical AVR in January 2024 who is here today for follow up. He was initially seen in our office in January 2023 for dyspnea and fatigue. Echo in December 2022 with normal LV function with moderate aortic insufficiency. Coronary CTA 01/23/21 with no evidence of CAD, calcium  score 0. No evidence of dilated aortic root on CTA January 2023. Repeat echo December 2023 with LVEF=60-65% with severe aortic valve regurgitation. Cardiac cath February 2024 with no evidence of CAD. He underwent surgical AVR with placement of 25 mm Inspiris Resilia bioprosthetic valve in March 2024. Echo April 2024 with normal LV systolic function and normally functioning AVR.   He is here today for follow up. The patient denies any chest pain, dyspnea, palpitations, lower extremity edema, orthopnea, PND, dizziness, near syncope or syncope.    Primary Care Physician: Johnny Garnette LABOR, MD  Past Medical History:  Diagnosis Date   Asthma    as child   Cancer [facility])    prostate    Cataract    Dyspnea    exertional dyspnea   ED (erectile dysfunction)    Fracture    rt ankle   Headache    Hx Migraines   Heart murmur    Hemorrhoids    History of colonic polyps    History of prostate cancer    see's Dr. Aleene   Pneumonia    50+ years ago    Past Surgical History:  Procedure Laterality Date   ANKLE FRACTURE SURGERY Left 2001   AORTIC VALVE REPLACEMENT N/A 03/10/2022   Procedure: AORTIC VALVE REPLACEMENT WITH EDWARDS INSPIRIS VALVE;  Surgeon: Shyrl Linnie KIDD, MD;  Location: [facility] OR;  Service: Open Heart Surgery;  Laterality: N/A;   CATARACT EXTRACTION W/ INTRAOCULAR LENS IMPLANT Bilateral    per Dr. Meridee    COLONOSCOPY  11/18/2018   per Dr. Albertus, adenomatous polyps, repeat in 7 yrs   HEMORRHOID SURGERY  1999   PROSTATECTOMY  2005   PROSTATECTOMY  1996   REFRACTIVE  SURGERY Right 11/07/2013   RETINAL TEAR REPAIR CRYOTHERAPY Bilateral    per Dr. Norleen Ku   RIGHT/LEFT HEART CATH AND CORONARY ANGIOGRAPHY N/A 02/13/2022   Procedure: RIGHT/LEFT HEART CATH AND CORONARY ANGIOGRAPHY;  Surgeon: Verlin Lonni BIRCH, MD;  Location: [facility] INVASIVE CV LAB;  Service: Cardiovascular;  Laterality: N/A;   rt bunion removed Left 1965   TEE WITHOUT CARDIOVERSION N/A 03/10/2022   Procedure: TRANSESOPHAGEAL ECHOCARDIOGRAM;  Surgeon: Shyrl Linnie KIDD, MD;  Location: [facility] OR;  Service: Open Heart Surgery;  Laterality: N/A;    Current Outpatient Medications  Medication Sig Dispense Refill   amLODipine  (NORVASC ) 10 MG tablet Take 1 tablet (10 mg total) by mouth daily. 90 tablet 3   aspirin  EC 81 MG tablet Take 1 tablet (81 mg total) by mouth daily. Swallow whole. 90 tablet 3   No current facility-administered medications for this visit.    Allergies  Allergen Reactions   Ciprofloxacin     headaches    Social History   Socioeconomic History   Marital status: Married    Spouse name: Ida   Number of children: 3   Years of education: Not on file   Highest education level: Master's degree (e.g., MA, MS, MEng, MEd, MSW, MBA)  Occupational History   Occupation: Retired  Tobacco Use   Smoking status: Never   Smokeless  tobacco: Never  Vaping Use   Vaping status: Never Used  Substance and Sexual Activity   Alcohol use: Yes    Alcohol/week: 0.0 standard drinks of alcohol    Comment: once a month   Drug use: No   Sexual activity: Yes  Other Topics Concern   Not on file  Social History Narrative   Married   Regular exercise- yes   Social Drivers of Health   Financial Resource Strain: Low Risk  (06/04/2022)   Overall Financial Resource Strain (CARDIA)    Difficulty of Paying Living Expenses: Not hard at all  Food Insecurity: No Food Insecurity (06/04/2022)   Hunger Vital Sign    Worried About Running Out of Food in the Last Year: Never true    Ran Out of  Food in the Last Year: Never true  Transportation Needs: No Transportation Needs (06/04/2022)   PRAPARE - Administrator, Civil Service (Medical): No    Lack of Transportation (Non-Medical): No  Physical Activity: Sufficiently Active (06/04/2022)   Exercise Vital Sign    Days of Exercise per Week: 5 days    Minutes of Exercise per Session: 60 min  Stress: No Stress Concern Present (06/04/2022)   [employer] of Occupational Health - Occupational Stress Questionnaire    Feeling of Stress : Not at all  Social Connections: Socially Integrated (06/04/2022)   Social Connection and Isolation Panel    Frequency of Communication with Friends and Family: More than three times a week    Frequency of Social Gatherings with Friends and Family: More than three times a week    Attends Religious Services: More than 4 times per year    Active Member of [employer] or Organizations: Yes    Attends Engineer, structural: More than 4 times per year    Marital Status: Married  Catering manager Violence: Not At Risk (06/04/2022)   Humiliation, Afraid, Rape, and Kick questionnaire    Fear of Current or Ex-Partner: No    Emotionally Abused: No    Physically Abused: No    Sexually Abused: No    Family History  Problem Relation Age of Onset   Alzheimer's disease Mother    Prostate cancer Father    Colon cancer Maternal Uncle 22   Alcohol abuse Other    Arthritis Other    Breast cancer Other    Ovarian cancer Other    Prostate cancer Other    Stroke Other    Esophageal cancer Neg Hx    Rectal cancer Neg Hx    Stomach cancer Neg Hx     Review of Systems:  As stated in the HPI and otherwise negative.   There were no vitals taken for this visit.  Physical Examination: General: Well developed, well nourished, NAD  HEENT: OP clear, mucus membranes moist  SKIN: warm, dry. No rashes. Neuro: No focal deficits  Musculoskeletal: Muscle strength 5/5 all ext  Psychiatric: Mood and affect  normal  Neck: No JVD, no carotid bruits, no thyromegaly, no lymphadenopathy.  Lungs:Clear bilaterally, no wheezes, rhonci, crackles Cardiovascular: Regular rate and rhythm. No murmurs, gallops or rubs. Abdomen:Soft. Bowel sounds present. Non-tender.  Extremities: No lower extremity edema. Pulses are 2 + in the bilateral DP/PT.  EKG:  EKG is *** not ordered today. The ekg ordered today demonstrates   Echo April 2024:  1. Left ventricular ejection fraction, by estimation, is 55 to 60%. Left  ventricular ejection fraction by 3D volume is 62 %.  The left ventricle has  normal function. The left ventricle has no regional wall motion  abnormalities. Left ventricular diastolic   parameters were normal. There is abnormal septal motion postoperative.   2. Right ventricular systolic function is mildly reduced. The right  ventricular size is normal. There is normal pulmonary artery systolic  pressure. The estimated right ventricular systolic pressure is 25.1 mmHg.   3. The mitral valve is grossly normal. No evidence of mitral valve  regurgitation. No evidence of mitral stenosis.   4. The aortic valve has been repaired/replaced. Aortic valve  regurgitation is not visualized. There is a 25 mm Edwards Inspiris valve  present in the aortic position. Procedure Date: 03/10/2022. Echo findings  are consistent with normal structure and  function of the aortic valve prosthesis. Aortic valve area, by VTI  measures 2.15 cm. Aortic valve mean gradient measures 11.3 mmHg. Aortic  valve Vmax measures 2.28 m/s. Aortic valve acceleration time measures 79  msec. DVI 0.41, AT/ET 0.33, iEOA 1.1 cm2.   5. Aortic dilatation noted. There is borderline dilatation of the aortic  root, measuring 40 mm. There is borderline dilatation of the ascending  aorta, measuring 41 mm.   6. The inferior vena cava is normal in size with greater than 50%  respiratory variability, suggesting right atrial pressure of 3 mmHg.   Recent  Labs: 09/24/2022: ALT 13; BUN 15; Creatinine, Ser 0.92; Hemoglobin 13.7; Platelets 195.0; Potassium 4.5; Sodium 139; TSH 1.76   Lipid Panel    Component Value Date/Time   CHOL 158 09/24/2022 0852   TRIG 71.0 09/24/2022 0852   HDL 67.70 09/24/2022 0852   CHOLHDL 2 09/24/2022 0852   VLDL 14.2 09/24/2022 0852   LDLCALC 76 09/24/2022 0852     Wt Readings from Last 3 Encounters:  09/24/22 182 lb 3.2 oz (82.6 kg)  06/10/22 183 lb (83 kg)  06/04/22 178 lb (80.7 kg)    Assessment and Plan:   Aortic valve insufficiency: Doing well post surgical AVR in 2024. AVR working well by echo April 2024. Continue ASA. SBE prophylaxis as indicated.    Labs/ tests ordered today include:  No orders of the defined types were placed in this encounter.  Disposition:   F/U with me in 12 months  Signed, Lonni Cash, MD 08/09/2023 4:08 PM    [facility] [address] [place], [place], [place]  [address] Phone: [phone]; Fax: [phone]

## 2023-08-10 ENCOUNTER — Ambulatory Visit: Attending: Cardiovascular Disease | Admitting: Cardiovascular Disease

## 2023-08-10 VITALS — BP 156/90 | HR 57 | Ht 68.0 in | Wt 180.0 lb

## 2023-08-10 DIAGNOSIS — I351 Nonrheumatic aortic (valve) insufficiency: Secondary | ICD-10-CM | POA: Diagnosis not present

## 2023-08-10 DIAGNOSIS — I10 Essential (primary) hypertension: Secondary | ICD-10-CM | POA: Diagnosis not present

## 2023-08-10 NOTE — Patient Instructions (Signed)
 Medication Instructions:  No changes *If you need a refill on your cardiac medications before your next appointment, please call your pharmacy*  Lab Work: none If you have labs (blood work) drawn today and your tests are completely normal, you will receive your results only by: MyChart Message (if you have MyChart) OR A paper copy in the mail If you have any lab test that is abnormal or we need to change your treatment, we will call you to review the results.  Testing/Procedures: none  Follow-Up: At [facility], you and your health needs are our priority.  As part of our continuing mission to provide you with exceptional heart care, our providers are all part of one team.  This team includes your primary Cardiologist (physician) and Advanced Practice Providers or APPs (Physician Assistants and Nurse Practitioners) who all work together to provide you with the care you need, when you need it.  Your next appointment:   12 month(s)  Provider:   Antoinette Batman, MD

## 2023-09-29 ENCOUNTER — Ambulatory Visit: Payer: Self-pay | Admitting: Family Medicine

## 2023-09-29 ENCOUNTER — Ambulatory Visit: Admitting: Family Medicine

## 2023-09-29 ENCOUNTER — Encounter: Payer: Self-pay | Admitting: Family Medicine

## 2023-09-29 VITALS — BP 130/80 | HR 56 | Temp 97.9°F | Ht 68.0 in | Wt 176.2 lb

## 2023-09-29 DIAGNOSIS — R739 Hyperglycemia, unspecified: Secondary | ICD-10-CM

## 2023-09-29 DIAGNOSIS — Z952 Presence of prosthetic heart valve: Secondary | ICD-10-CM | POA: Diagnosis not present

## 2023-09-29 DIAGNOSIS — I10 Essential (primary) hypertension: Secondary | ICD-10-CM | POA: Diagnosis not present

## 2023-09-29 DIAGNOSIS — N529 Male erectile dysfunction, unspecified: Secondary | ICD-10-CM | POA: Diagnosis not present

## 2023-09-29 DIAGNOSIS — L989 Disorder of the skin and subcutaneous tissue, unspecified: Secondary | ICD-10-CM | POA: Diagnosis not present

## 2023-09-29 DIAGNOSIS — D709 Neutropenia, unspecified: Secondary | ICD-10-CM | POA: Diagnosis not present

## 2023-09-29 DIAGNOSIS — Z23 Encounter for immunization: Secondary | ICD-10-CM

## 2023-09-29 LAB — LIPID PANEL
Cholesterol: 168 mg/dL (ref 0–200)
HDL: 68.7 mg/dL (ref >39.00)
LDL Cholesterol: 90 mg/dL (ref 0–99)
NonHDL: 99.16
Total CHOL/HDL Ratio: 2
Triglycerides: 44 mg/dL (ref 0.0–149.0)
VLDL: 8.8 mg/dL (ref 0.0–40.0)

## 2023-09-29 LAB — HEPATIC FUNCTION PANEL
ALT: 12 U/L (ref 0–53)
AST: 20 U/L (ref 0–37)
Albumin: 4.6 g/dL (ref 3.5–5.2)
Alkaline Phosphatase: 104 U/L (ref 39–117)
Bilirubin, Direct: 0.2 mg/dL (ref 0.0–0.3)
Total Bilirubin: 0.7 mg/dL (ref 0.2–1.2)
Total Protein: 7.4 g/dL (ref 6.0–8.3)

## 2023-09-29 LAB — BASIC METABOLIC PANEL WITH GFR
BUN: 11 mg/dL (ref 6–23)
CO2: 26 meq/L (ref 19–32)
Calcium: 9.4 mg/dL (ref 8.4–10.5)
Chloride: 99 meq/L (ref 96–112)
Creatinine, Ser: 0.9 mg/dL (ref 0.40–1.50)
GFR: 84.97 mL/min (ref >60.00)
Glucose, Bld: 84 mg/dL (ref 70–99)
Potassium: 4.5 meq/L (ref 3.5–5.1)
Sodium: 136 meq/L (ref 135–145)

## 2023-09-29 LAB — CBC WITH DIFFERENTIAL/PLATELET
Basophils Absolute: 0 K/uL (ref 0.0–0.1)
Basophils Relative: 1.4 % (ref 0.0–3.0)
Eosinophils Absolute: 0.1 K/uL (ref 0.0–0.7)
Eosinophils Relative: 2.7 % (ref 0.0–5.0)
HCT: 43.3 % (ref 39.0–52.0)
Hemoglobin: 14 g/dL (ref 13.0–17.0)
Lymphocytes Relative: 52.6 % — ABNORMAL HIGH (ref 12.0–46.0)
Lymphs Abs: 1.5 K/uL (ref 0.7–4.0)
MCHC: 32.3 g/dL (ref 30.0–36.0)
MCV: 87.9 fl (ref 78.0–100.0)
Monocytes Absolute: 0.4 K/uL (ref 0.1–1.0)
Monocytes Relative: 12.5 % — ABNORMAL HIGH (ref 3.0–12.0)
Neutro Abs: 0.9 K/uL — ABNORMAL LOW (ref 1.4–7.7)
Neutrophils Relative %: 30.8 % — ABNORMAL LOW (ref 43.0–77.0)
Platelets: 230 K/uL (ref 150.0–400.0)
RBC: 4.93 Mil/uL (ref 4.22–5.81)
RDW: 14.5 % (ref 11.5–15.5)
WBC: 2.8 K/uL — ABNORMAL LOW (ref 4.0–10.5)

## 2023-09-29 LAB — HEMOGLOBIN A1C: Hgb A1c MFr Bld: 6 % (ref 4.6–6.5)

## 2023-09-29 LAB — TSH: TSH: 1.07 u[IU]/mL (ref 0.35–5.50)

## 2023-09-29 NOTE — Addendum Note (Signed)
 Addended by: LADONNA INOCENTE SAILOR on: 09/29/2023 12:14 PM   Modules accepted: Orders

## 2023-09-29 NOTE — Addendum Note (Signed)
 Addended by: JOHNNY SENIOR A on: 09/29/2023 05:17 PM   Modules accepted: Orders

## 2023-09-29 NOTE — Progress Notes (Signed)
 Subjective:    Patient ID: Matthew [name], male    DOB: 04/30/1950, 73 y.o.   MRN: [id number]  HPI Here to follow up on issues. His HTN has been well controlled. He recently saw Dr. Valencia to follow up his AV replacement, and he is doing well from a cardiac standpoint. He sees Dr. Ronal Leeroy Shank for urology care. She follows his PSA counts and she treats his ED. He is currently using Trimix injections with mixed results. He asks us  to check a lesion on his lower back that appeared about a year ago. It recently became itchy and tender, now it is back to baseline.    Review of Systems  Constitutional: Negative.   HENT: Negative.    Eyes: Negative.   Respiratory: Negative.    Cardiovascular: Negative.   Gastrointestinal: Negative.   Genitourinary: Negative.   Musculoskeletal: Negative.   Skin: Negative.   Neurological: Negative.   Psychiatric/Behavioral: Negative.         Objective:   Physical Exam Constitutional:      General: He is not in acute distress.    Appearance: Normal appearance. He is well-developed. He is not diaphoretic.  HENT:     Head: Normocephalic and atraumatic.     Right Ear: External ear normal.     Left Ear: External ear normal.     Nose: Nose normal.     Mouth/Throat:     Pharynx: No oropharyngeal exudate.  Eyes:     General: No scleral icterus.       Right eye: No discharge.        Left eye: No discharge.     Conjunctiva/sclera: Conjunctivae normal.     Pupils: Pupils are equal, round, and reactive to light.  Neck:     Thyroid : No thyromegaly.     Vascular: No JVD.     Trachea: No tracheal deviation.  Cardiovascular:     Rate and Rhythm: Normal rate and regular rhythm.     Pulses: Normal pulses.     Heart sounds: Normal heart sounds. No murmur heard.    No friction rub. No gallop.  Pulmonary:     Effort: Pulmonary effort is normal. No respiratory distress.     Breath sounds: Normal breath sounds. No wheezing or rales.  Chest:     Chest  wall: No tenderness.  Abdominal:     General: Bowel sounds are normal. There is no distension.     Palpations: Abdomen is soft. There is no mass.     Tenderness: There is no abdominal tenderness. There is no guarding or rebound.  Genitourinary:    Penis: Normal. No tenderness.      Testes: Normal.  Musculoskeletal:        General: No tenderness. Normal range of motion.     Cervical back: Neck supple.  Lymphadenopathy:     Cervical: No cervical adenopathy.  Skin:    General: Skin is warm and dry.     Coloration: Skin is not pale.     Findings: No erythema or rash.     Comments: There is a papular lesion in the center of his lower back that is hyperpigmented   Neurological:     General: No focal deficit present.     Mental Status: He is alert and oriented to person, place, and time.     Cranial Nerves: No cranial nerve deficit.     Motor: No abnormal muscle tone.     Coordination: Coordination  normal.     Deep Tendon Reflexes: Reflexes are normal and symmetric. Reflexes normal.  Psychiatric:        Mood and Affect: Mood normal.        Behavior: Behavior normal.        Thought Content: Thought content normal.        Judgment: Judgment normal.           Assessment & Plan:  His HTN is stable. He will follow up with Cardiology for the hx of AVR. He will follow up with Urology for the hx of prostate cancer and the ED. We will refer him to Dermatology to evaluate the back skin lesion. Get labs to check lipids, etc. We spent a total of (34   ) minutes reviewing records and discussing these issues.  Garnette Olmsted, MD

## 2023-09-30 NOTE — Progress Notes (Signed)
 Spoke with patient regarding results and Dr. Mira recommendations. Questions answered. Advised patient of referral. Advised patient to call the office if he has not heard from referral within 2 weeks. Patient voiced understanding.

## 2023-10-14 NOTE — Progress Notes (Signed)
 Matthew [name]                                          MRN: [id number]   10/14/2023   The [facility] Quality Team Specialist reviewed this patient medical record for the purposes of chart review for care gap closure. The following were reviewed: abstraction for care gap closure-controlling blood pressure.    [facility] Quality Team

## 2023-10-23 ENCOUNTER — Inpatient Hospital Stay

## 2023-10-23 ENCOUNTER — Inpatient Hospital Stay: Attending: Hematology | Admitting: Hematology

## 2023-10-23 ENCOUNTER — Other Ambulatory Visit: Payer: Self-pay

## 2023-10-23 VITALS — BP 126/84 | HR 66 | Temp 97.6°F | Resp 17 | Ht 68.0 in | Wt 177.0 lb

## 2023-10-23 DIAGNOSIS — Z811 Family history of alcohol abuse and dependence: Secondary | ICD-10-CM | POA: Insufficient documentation

## 2023-10-23 DIAGNOSIS — J45909 Unspecified asthma, uncomplicated: Secondary | ICD-10-CM | POA: Diagnosis not present

## 2023-10-23 DIAGNOSIS — Z8719 Personal history of other diseases of the digestive system: Secondary | ICD-10-CM | POA: Insufficient documentation

## 2023-10-23 DIAGNOSIS — Z860101 Personal history of adenomatous and serrated colon polyps: Secondary | ICD-10-CM | POA: Diagnosis not present

## 2023-10-23 DIAGNOSIS — R61 Generalized hyperhidrosis: Secondary | ICD-10-CM | POA: Diagnosis not present

## 2023-10-23 DIAGNOSIS — Z7982 Long term (current) use of aspirin: Secondary | ICD-10-CM | POA: Insufficient documentation

## 2023-10-23 DIAGNOSIS — Z803 Family history of malignant neoplasm of breast: Secondary | ICD-10-CM | POA: Diagnosis not present

## 2023-10-23 DIAGNOSIS — D709 Neutropenia, unspecified: Secondary | ICD-10-CM

## 2023-10-23 DIAGNOSIS — Z8042 Family history of malignant neoplasm of prostate: Secondary | ICD-10-CM | POA: Diagnosis not present

## 2023-10-23 DIAGNOSIS — Z9841 Cataract extraction status, right eye: Secondary | ICD-10-CM | POA: Diagnosis not present

## 2023-10-23 DIAGNOSIS — Z818 Family history of other mental and behavioral disorders: Secondary | ICD-10-CM | POA: Diagnosis not present

## 2023-10-23 DIAGNOSIS — Z881 Allergy status to other antibiotic agents status: Secondary | ICD-10-CM | POA: Diagnosis not present

## 2023-10-23 DIAGNOSIS — Z8709 Personal history of other diseases of the respiratory system: Secondary | ICD-10-CM | POA: Insufficient documentation

## 2023-10-23 DIAGNOSIS — Z8261 Family history of arthritis: Secondary | ICD-10-CM | POA: Insufficient documentation

## 2023-10-23 DIAGNOSIS — Z8701 Personal history of pneumonia (recurrent): Secondary | ICD-10-CM | POA: Insufficient documentation

## 2023-10-23 DIAGNOSIS — Z9079 Acquired absence of other genital organ(s): Secondary | ICD-10-CM | POA: Insufficient documentation

## 2023-10-23 DIAGNOSIS — Z8546 Personal history of malignant neoplasm of prostate: Secondary | ICD-10-CM | POA: Insufficient documentation

## 2023-10-23 DIAGNOSIS — Z800 Family history of malignant neoplasm of digestive organs: Secondary | ICD-10-CM | POA: Insufficient documentation

## 2023-10-23 DIAGNOSIS — Z9842 Cataract extraction status, left eye: Secondary | ICD-10-CM | POA: Diagnosis not present

## 2023-10-23 DIAGNOSIS — Z823 Family history of stroke: Secondary | ICD-10-CM | POA: Diagnosis not present

## 2023-10-23 DIAGNOSIS — Z79899 Other long term (current) drug therapy: Secondary | ICD-10-CM | POA: Insufficient documentation

## 2023-10-23 LAB — CBC WITH DIFFERENTIAL (CANCER CENTER ONLY)
Abs Immature Granulocytes: 0.01 10*3/uL (ref 0.00–0.07)
Basophils Absolute: 0.1 10*3/uL (ref 0.0–0.1)
Basophils Relative: 1 %
Eosinophils Absolute: 0.1 10*3/uL (ref 0.0–0.5)
Eosinophils Relative: 1 %
HCT: 41.2 % (ref 39.0–52.0)
Hemoglobin: 13.6 g/dL (ref 13.0–17.0)
Immature Granulocytes: 0 %
Lymphocytes Relative: 46 %
Lymphs Abs: 2.4 10*3/uL (ref 0.7–4.0)
MCH: 28.8 pg (ref 26.0–34.0)
MCHC: 33 g/dL (ref 30.0–36.0)
MCV: 87.3 fL (ref 80.0–100.0)
Monocytes Absolute: 0.4 10*3/uL (ref 0.1–1.0)
Monocytes Relative: 9 %
Neutro Abs: 2.2 10*3/uL (ref 1.7–7.7)
Neutrophils Relative %: 43 %
Platelet Count: 219 10*3/uL (ref 150–400)
RBC: 4.72 MIL/uL (ref 4.22–5.81)
RDW: 14 % (ref 11.5–15.5)
WBC Count: 5.2 10*3/uL (ref 4.0–10.5)
nRBC: 0 % (ref 0.0–0.2)

## 2023-10-23 LAB — CMP (CANCER CENTER ONLY)
ALT: 16 U/L (ref 0–44)
AST: 35 U/L (ref 15–41)
Albumin: 4.6 g/dL (ref 3.5–5.0)
Alkaline Phosphatase: 121 U/L (ref 38–126)
Anion gap: 12 (ref 5–15)
BUN: 14 mg/dL (ref 8–23)
CO2: 27 mmol/L (ref 22–32)
Calcium: 9.7 mg/dL (ref 8.9–10.3)
Chloride: 101 mmol/L (ref 98–111)
Creatinine: 0.79 mg/dL (ref 0.61–1.24)
GFR, Estimated: >60 mL/min
Glucose, Bld: 83 mg/dL (ref 70–99)
Potassium: 4 mmol/L (ref 3.5–5.1)
Sodium: 140 mmol/L (ref 135–145)
Total Bilirubin: 0.4 mg/dL (ref 0.0–1.2)
Total Protein: 7.5 g/dL (ref 6.5–8.1)

## 2023-10-23 LAB — ABO/RH: ABO/RH(D): O POS

## 2023-10-23 LAB — ANTIBODY SCREEN: Antibody Screen: NEGATIVE

## 2023-10-23 LAB — HEPATITIS C ANTIBODY: HCV Ab: NONREACTIVE

## 2023-10-23 LAB — HIV ANTIBODY (ROUTINE TESTING W REFLEX): HIV Screen 4th Generation wRfx: NONREACTIVE

## 2023-10-23 NOTE — Progress Notes (Signed)
 HEMATOLOGY/ONCOLOGY CONSULTATION NOTE  Date of Service: 10/23/2023  Patient Care Team: Johnny Garnette LABOR, MD as PCP - General Verlin Lonni BIRCH, MD as PCP - Cardiology (Cardiology)  CHIEF COMPLAINTS/PURPOSE OF CONSULTATION:  Neutropenia   HISTORY OF PRESENTING ILLNESS:  Matthew [name] is a wonderful 73 y.o. male who has been referred to us  by Dr. Johnny, Garnette LABOR, MD for evaluation and management of Neutropenia. accompanied by wife.  Initially seen 08/08/2015:  Matthew [name] is a wonderful 73 y.o. male who has been referred to us  by Dr .JOHNNY GARNETTE LABOR, MD for evaluation and management of neutropenia.   Patient has a history of prostate cancer treated with prostatectomy , asthma as a child, hemorrhoid status post surgery reports having a tick bite to his right flank in May 2017. He reports that he had a localized rash and was treated with an antibiotic cannot remember which one. Per Dr. Johnny it was keflex but patient notes it could have been doxycycline .   Patient was recently seen by his primary care physician on 07/12/2015 for a wellness visit and had a CBC which showed leukopenia with a WBC count of 3k with a neutrophil count of 0.9.  Hemoglobin was normal at 14.8 with a normal MCV. Platelets are normal at 220k   Patient's labs reviewed in the system showed he had an ANC of 1.2k about a year ago and 1.5k about 3 years ago.   Notes no fevers no chills no overt viral infections. Other than the antibiotics denies any new medications. No specific chemical exposures.   Notes that he feels well overall. No enlarged lymph nodes. No abdominal pain. No issues with frequent infections.  Last seen 02/08/2016: Matthew [name] is here for f/u of his neutropenia.  He notes that he feeling great and has no symptoms and has not had any issues with infections. No new focal symptoms. No fevers/chills/nightsweat/weight loss. No bone pains. His ANC on his last clinic visit was WNL at 2k and is down  to 1.1k today with nl hgb and platelets. No new medications.  INTERVAL HISTORY  Patient has been referred back to us  for evaluation and management of his chronic intermittent neutropenia. Today, 10/23/2023, he reports that he is s/p Aortic Heart Valve Replacement 03/10/2022 by Dr. Shyrl from which he has healed well from and his energy levels have improved. He notes that he did require a blood transfusion.   ANC 1100 when last seen on 02/08/2016, from there it has generally remained stable at 1100 - 1300, with 1500 at its highest. In September his ANC was 900.   Denies symptoms of allergies, food intolerances, recent infections, change in bowel habits, bloody/mucus-filled stools, or abdominal cramping - notes that he did have an infected mole that took about 2 months to heal. Otherwise, he says that there has not been any new developments. He takes Vitamins A, B, C, D, E, as well as Aspirin . Denies taking Zinc or unusually supplement, having dentures, smoking or regular EToH consumption, or tattoos. Has reportedly traveled to [place], [place], and [place]. [place].  He does endorse some night sweats that have improved from a couple of months ago, but also notes that this occurs mainly when he is under the covers.  No fevers no chills no night sweats.  No unexpected weight loss.  No new bone pains.  MEDICAL HISTORY:  Past Medical History:  Diagnosis Date   Asthma    as child   Cancer (HCC)  prostate    Cataract    Dyspnea    exertional dyspnea   ED (erectile dysfunction)    Fracture    rt ankle   Headache    Hx Migraines   Heart murmur    Hemorrhoids    History of colonic polyps    History of prostate cancer    see's Dr. Aleene   Pneumonia    50+ years ago    SURGICAL HISTORY: Past Surgical History:  Procedure Laterality Date   ANKLE FRACTURE SURGERY Left 2001   AORTIC VALVE REPLACEMENT N/A 03/10/2022   Procedure: AORTIC VALVE REPLACEMENT WITH EDWARDS INSPIRIS  VALVE;  Surgeon: Shyrl Linnie KIDD, MD;  Location: [facility] OR;  Service: Open Heart Surgery;  Laterality: N/A;   CATARACT EXTRACTION W/ INTRAOCULAR LENS IMPLANT Bilateral    per Dr. Meridee    COLONOSCOPY  11/18/2018   per Dr. Albertus, adenomatous polyps, repeat in 7 yrs   HEMORRHOID SURGERY  1999   PROSTATECTOMY  2005   PROSTATECTOMY  1996   REFRACTIVE SURGERY Right 11/07/2013   RETINAL TEAR REPAIR CRYOTHERAPY Bilateral    per Dr. Norleen Ku   RIGHT/LEFT HEART CATH AND CORONARY ANGIOGRAPHY N/A 02/13/2022   Procedure: RIGHT/LEFT HEART CATH AND CORONARY ANGIOGRAPHY;  Surgeon: Verlin Lonni BIRCH, MD;  Location: [facility] INVASIVE [facility];  Service: Cardiovascular;  Laterality: N/A;   rt bunion removed Left 1965   TEE WITHOUT CARDIOVERSION N/A 03/10/2022   Procedure: TRANSESOPHAGEAL ECHOCARDIOGRAM;  Surgeon: Shyrl Linnie KIDD, MD;  Location: [facility] OR;  Service: Open Heart Surgery;  Laterality: N/A;    SOCIAL HISTORY: Social History   Socioeconomic History   Marital status: Married    Spouse name: Ida   Number of children: 3   Years of education: Not on file   Highest education level: Master's degree (e.g., MA, MS, MEng, MEd, MSW, MBA)  Occupational History   Occupation: Retired  Tobacco Use   Smoking status: Never   Smokeless tobacco: Never  Vaping Use   Vaping status: Never Used  Substance and Sexual Activity   Alcohol use: Yes    Alcohol/week: 0.0 standard drinks of alcohol    Comment: once a month   Drug use: No   Sexual activity: Yes  Other Topics Concern   Not on file  Social History Narrative   Married   Regular exercise- yes   Social Drivers of Health   Financial Resource Strain: Low Risk  (09/25/2023)   Overall Financial Resource Strain (CARDIA)    Difficulty of Paying Living Expenses: Not hard at all  Food Insecurity: No Food Insecurity (09/25/2023)   Hunger Vital Sign    Worried About Running Out of Food in the Last Year: Never true    Ran Out of Food in the  Last Year: Never true  Transportation Needs: No Transportation Needs (09/25/2023)   PRAPARE - Administrator, Civil Service (Medical): No    Lack of Transportation (Non-Medical): No  Physical Activity: Sufficiently Active (09/25/2023)   Exercise Vital Sign    Days of Exercise per Week: 5 days    Minutes of Exercise per Session: 60 min  Stress: No Stress Concern Present (09/25/2023)   [employer] of Occupational Health - Occupational Stress Questionnaire    Feeling of Stress: Only a little  Social Connections: Socially Integrated (09/25/2023)   Social Connection and Isolation Panel    Frequency of Communication with Friends and Family: Three times a week    Frequency of  Social Gatherings with Friends and Family: Once a week    Attends Religious Services: More than 4 times per year    Active Member of [employer] or Organizations: Yes    Attends Engineer, Structural: More than 4 times per year    Marital Status: Married  Catering Manager Violence: Not At Risk (06/04/2022)   Humiliation, Afraid, Rape, and Kick questionnaire    Fear of Current or Ex-Partner: No    Emotionally Abused: No    Physically Abused: No    Sexually Abused: No   Social History   Social History Narrative   Married   Regular exercise- yes    FAMILY HISTORY: Family History  Problem Relation Age of Onset   Alzheimer's disease Mother    Prostate cancer Father    Colon cancer Maternal Uncle 59   Alcohol abuse Other    Arthritis Other    Breast cancer Other    Ovarian cancer Other    Prostate cancer Other    Stroke Other    Esophageal cancer Neg Hx    Rectal cancer Neg Hx    Stomach cancer Neg Hx     ALLERGIES:  is allergic to ciprofloxacin.  MEDICATIONS:  Current Outpatient Medications  Medication Sig Dispense Refill   amLODipine  (NORVASC ) 10 MG tablet Take 1 tablet (10 mg total) by mouth daily. (Patient taking differently: Take 5 mg by mouth daily.) 90 tablet 3   aspirin  EC 81 MG  tablet Take 1 tablet (81 mg total) by mouth daily. Swallow whole. 90 tablet 3   No current facility-administered medications for this visit.    REVIEW OF SYSTEMS:    10 Point review of Systems was done is negative except as noted above.  PHYSICAL EXAMINATION: ECOG PERFORMANCE STATUS: 0 - Asymptomatic  Vitals:   10/23/23 1435  BP: 126/84  Pulse: 66  Resp: 17  Temp: 97.6 F (36.4 C)  SpO2: 97%   Filed Weights   10/23/23 1435  Weight: 177 lb (80.3 kg)   Body mass index is 26.91 kg/m.  GENERAL: alert, in no acute distress and comfortable SKIN: no acute rashes, no significant lesions EYES: conjunctiva are pink and non-injected, sclera anicteric OROPHARYNX: MMM, no exudates, no oropharyngeal erythema or ulceration NECK: supple, no JVD LYMPH: no palpable lymphadenopathy in the cervical, axillary or inguinal regions LUNGS: clear to auscultation b/l with normal respiratory effort HEART: regular rate & rhythm ABDOMEN: normoactive bowel sounds, non tender, not distended, no hepatosplenomegaly Extremity: no pedal edema PSYCH: alert & oriented x 3 with fluent speech NEURO: no focal motor/sensory deficits  LABORATORY DATA:  I have reviewed the data as listed     Latest Ref Rng & Units 09/29/2023    9:34 AM 09/24/2022    8:52 AM  CBC EXTENDED  WBC 4.0 - 10.5 K/uL 2.8  3.7   RBC 4.22 - 5.81 MIL/uL 4.93  4.80   Hemoglobin 13.0 - 17.0 g/dL 85.9  86.2   HCT 60.9 - 52.0 % 43.3  42.4   Platelets 150 - 400 K/uL 230.0  195.0   NEUT# 1.7 - 7.7 K/uL 0.9  1.1   Lymph# 0.7 - 4.0 K/uL 1.5  2.0    Lab Results  Component Value Date   NEUTROABS 0.9 (L) 09/29/2023   NEUTROABS 1.1 (L) 09/24/2022   NEUTROABS 1.5 09/17/2021   NEUTROABS 1.2 (L) 11/19/2020   NEUTROABS 1.1 (L) 03/09/2020   NEUTROABS 1.3 (L) 10/11/2018   NEUTROABS 1.1 (L) 11/06/2016  NEUTROABS 1.1 (L) 02/08/2016   NEUTROABS 2.0 08/08/2015   NEUTROABS 0.9 (L) 06/28/2015   NEUTROABS 1.2 (L) 06/09/2014   NEUTROABS 1.5  02/13/2012   NEUTROABS 1.2 (L) 09/23/2010   NEUTROABS 1.1 (L) 04/12/2009   Wt Readings from Last 13 Encounters:  10/23/23 177 lb (80.3 kg)  09/29/23 176 lb 3.2 oz (79.9 kg)  08/10/23 180 lb (81.6 kg)  09/24/22 182 lb 3.2 oz (82.6 kg)  06/10/22 183 lb (83 kg)  06/04/22 178 lb (80.7 kg)  05/21/22 184 lb 11.9 oz (83.8 kg)  05/01/22 178 lb (80.7 kg)  04/22/22 182 lb (82.6 kg)  04/15/22 185 lb (83.9 kg)  03/31/22 182 lb (82.6 kg)  03/16/22 191 lb 4.8 oz (86.8 kg)  03/07/22 192 lb 14.4 oz (87.5 kg)      Latest Ref Rng & Units 09/29/2023    9:34 AM 09/24/2022    8:52 AM 04/07/2022    7:56 AM  CMP  Glucose 70 - 99 mg/dL 84  91  872   BUN 6 - 23 mg/dL 11  15  10    Creatinine 0.40 - 1.50 mg/dL 9.09  9.07  9.14   Sodium 135 - 145 mEq/L 136  139  141   Potassium 3.5 - 5.1 mEq/L 4.5  4.5  4.3   Chloride 96 - 112 mEq/L 99  102  101   CO2 19 - 32 mEq/L 26  29  23    Calcium  8.4 - 10.5 mg/dL 9.4  9.6  9.5   Total Protein 6.0 - 8.3 g/dL 7.4  7.3    Total Bilirubin 0.2 - 1.2 mg/dL 0.7  0.5    Alkaline Phos 39 - 117 U/L 104  75    AST 0 - 37 U/L 20  22    ALT 0 - 53 U/L 12  13      RADIOGRAPHIC STUDIES: I have personally reviewed the radiological images as listed and agreed with the findings in the report. No results found.  ASSESSMENT & PLAN:  73 y.o. male with  73 year old male in good overall health with   #1 Mild isolated neutropenia x for about 3-69yrs.  (ANC 0.9 on 06/28/2015 had normalized to 2k on 08/08/2015, 02/08/2016 1.1k) No issues with recurrent infections. S/p s/p Aortic Heart Valve Replacement 03/10/2022.  Currently patient has no abnormal fevers/chills or other symptoms suggestive of a lymphoproliferative disorder. No Family history or personal history of neutropenia as a child or history of recurrent infections to suggest congenital neutropenia. Could be due to medications, procedure requiring transfusion, post viral neutropenia, autoimmune etiologies. B12 and copper  within  normal limits TSH within normal limits  PLAN: Patient was last seen by us  in 2018 for evaluation of his neutropenia. At that time he was evaluated and not noted to have any overt findings of a clonal bone marrow disorder.  -He was noted to have mild fluctuating intermittent isolated neutropenia which has been non progressive suggesting a relatively benign etiology at this time and No indication for bone marrow biopsy or other treatment at this time. - We had recommended to Kindly reconsult us  if the there is a progressive drop in the ANC especially <700 without clear cause or other cytopenias or blood count abnormalities develop. - Patient had recent labs in September with PCP which showed WBC count of 2.8k with an ANC of 900 and he was sent to us  as a new consultation again for evaluation of his neutropenia. - Patient notes no new symptoms  at this time.  No new viral infections.  Has had a few vaccines.  Frequent infections or severe or atypical infections.  He feels very well overall.  No fevers no chills no night sweats.  No weight loss.  No new bone pains. Discussed potential etiologies of neutropenia workup for neutropenia was ordered as noted below.  . Orders Placed This Encounter  Procedures   CBC with Differential (Cancer Center Only)    Standing Status:   Future    Number of Occurrences:   1    Expected Date:   10/23/2023    Expiration Date:   10/22/2024   CMP (Cancer Center only)    Standing Status:   Future    Number of Occurrences:   1    Expected Date:   10/23/2023    Expiration Date:   10/22/2024   HIV Antibody (routine testing w rflx)    Standing Status:   Future    Number of Occurrences:   1    Expected Date:   10/23/2023    Expiration Date:   10/22/2024   Hepatitis C antibody    Standing Status:   Future    Number of Occurrences:   1    Expected Date:   10/23/2023    Expiration Date:   10/22/2024   Copper , serum    Standing Status:   Future    Number of  Occurrences:   1    Expected Date:   10/23/2023    Expiration Date:   10/22/2024   ANA, IFA (with reflex)    Standing Status:   Future    Number of Occurrences:   1    Expected Date:   10/23/2023    Expiration Date:   10/22/2024   Pretreatment RBC phenotype    Standing Status:   Future    Number of Occurrences:   1    Expected Date:   10/23/2023    Expiration Date:   10/22/2024    Medication to be given::   Other - use comment field             Checking for Duffy null status with neutropenia   ABO/RH    Standing Status:   Future    Number of Occurrences:   1    Expiration Date:   10/22/2024   Antibody screen    FOLLOW-UP  Labs today Phone visit with Dr Onesimo in 1 week  The total time spent in the appointment was 45 minutes* . All of the patient's questions were answered and the patient knows to call the clinic with any problems, questions, or concerns.  Emaline Onesimo MD MS AAHIVMS [facility] Hematology/Oncology Physician [facility]  *Total Encounter Time as defined by the Centers for Medicare and Medicaid Services includes, in addition to the face-to-face time of a patient visit (documented in the note above) non-face-to-face time: obtaining and reviewing outside history, ordering and reviewing medications, tests or procedures, care coordination (communications with other health care professionals or caregivers) and documentation in the medical record.  I,Emily Lagle,acting as a neurosurgeon for Emaline Onesimo, MD.,have documented all relevant documentation on the behalf of Emaline Onesimo, MD,as directed by  Emaline Onesimo, MD while in the presence of Emaline Onesimo, MD.  I have reviewed the above documentation for accuracy and completeness, and I agree with the above.  Devaun Hernandez, MD

## 2023-10-25 LAB — PRETREATMENT RBC PHENOTYPE

## 2023-10-26 LAB — ANTINUCLEAR ANTIBODIES, IFA: ANA Ab, IFA: NEGATIVE

## 2023-10-27 LAB — COPPER, SERUM: Copper: 101 ug/dL (ref 69–132)

## 2023-10-29 DIAGNOSIS — H5213 Myopia, bilateral: Secondary | ICD-10-CM | POA: Diagnosis not present

## 2023-10-30 ENCOUNTER — Inpatient Hospital Stay (HOSPITAL_BASED_OUTPATIENT_CLINIC_OR_DEPARTMENT_OTHER): Admitting: Hematology

## 2023-10-30 DIAGNOSIS — Z803 Family history of malignant neoplasm of breast: Secondary | ICD-10-CM | POA: Diagnosis not present

## 2023-10-30 DIAGNOSIS — R61 Generalized hyperhidrosis: Secondary | ICD-10-CM | POA: Diagnosis not present

## 2023-10-30 DIAGNOSIS — Z8709 Personal history of other diseases of the respiratory system: Secondary | ICD-10-CM | POA: Diagnosis not present

## 2023-10-30 DIAGNOSIS — Z9842 Cataract extraction status, left eye: Secondary | ICD-10-CM | POA: Diagnosis not present

## 2023-10-30 DIAGNOSIS — Z811 Family history of alcohol abuse and dependence: Secondary | ICD-10-CM | POA: Diagnosis not present

## 2023-10-30 DIAGNOSIS — Z7982 Long term (current) use of aspirin: Secondary | ICD-10-CM | POA: Diagnosis not present

## 2023-10-30 DIAGNOSIS — Z823 Family history of stroke: Secondary | ICD-10-CM | POA: Diagnosis not present

## 2023-10-30 DIAGNOSIS — Z9079 Acquired absence of other genital organ(s): Secondary | ICD-10-CM | POA: Diagnosis not present

## 2023-10-30 DIAGNOSIS — Z8546 Personal history of malignant neoplasm of prostate: Secondary | ICD-10-CM | POA: Diagnosis not present

## 2023-10-30 DIAGNOSIS — Z8261 Family history of arthritis: Secondary | ICD-10-CM | POA: Diagnosis not present

## 2023-10-30 DIAGNOSIS — Z79899 Other long term (current) drug therapy: Secondary | ICD-10-CM | POA: Diagnosis not present

## 2023-10-30 DIAGNOSIS — J45909 Unspecified asthma, uncomplicated: Secondary | ICD-10-CM | POA: Diagnosis not present

## 2023-10-30 DIAGNOSIS — Z800 Family history of malignant neoplasm of digestive organs: Secondary | ICD-10-CM | POA: Diagnosis not present

## 2023-10-30 DIAGNOSIS — Z8042 Family history of malignant neoplasm of prostate: Secondary | ICD-10-CM | POA: Diagnosis not present

## 2023-10-30 DIAGNOSIS — Z9841 Cataract extraction status, right eye: Secondary | ICD-10-CM | POA: Diagnosis not present

## 2023-10-30 DIAGNOSIS — Z8719 Personal history of other diseases of the digestive system: Secondary | ICD-10-CM | POA: Diagnosis not present

## 2023-10-30 DIAGNOSIS — Z860101 Personal history of adenomatous and serrated colon polyps: Secondary | ICD-10-CM | POA: Diagnosis not present

## 2023-10-30 DIAGNOSIS — D709 Neutropenia, unspecified: Secondary | ICD-10-CM

## 2023-10-30 DIAGNOSIS — Z8701 Personal history of pneumonia (recurrent): Secondary | ICD-10-CM | POA: Diagnosis not present

## 2023-10-30 DIAGNOSIS — Z818 Family history of other mental and behavioral disorders: Secondary | ICD-10-CM | POA: Diagnosis not present

## 2023-10-30 DIAGNOSIS — Z881 Allergy status to other antibiotic agents status: Secondary | ICD-10-CM | POA: Diagnosis not present

## 2023-10-30 NOTE — Progress Notes (Signed)
 HEMATOLOGY/ONCOLOGY CONSULTATION NOTE  Date of Service: 10/30/2023  Patient Care Team: Johnny Garnette LABOR, MD as PCP - General Verlin Lonni BIRCH, MD as PCP - Cardiology (Cardiology)  CHIEF COMPLAINTS/PURPOSE OF CONSULTATION:  Neutropenia   HISTORY OF PRESENTING ILLNESS:  (08/08/2015):  ERYCK NEGRON is a wonderful 73 y.o. male who has been referred to us  by Dr .JOHNNY GARNETTE LABOR, MD for evaluation and management of neutropenia.   Patient has a history of prostate cancer treated with prostatectomy , asthma as a child, hemorrhoid status post surgery reports having a tick bite to his right flank in May 2017. He reports that he had a localized rash and was treated with an antibiotic cannot remember which one. Per Dr. Johnny it was keflex but patient notes it could have been doxycycline .   Patient was recently seen by his primary care physician on 07/12/2015 for a wellness visit and had a CBC which showed leukopenia with a WBC count of 3k with a neutrophil count of 0.9.  Hemoglobin was normal at 14.8 with a normal MCV. Platelets are normal at 220k   Patient's labs reviewed in the system showed he had an ANC of 1.2k about a year ago and 1.5k about 3 years ago.   Notes no fevers no chills no overt viral infections. Other than the antibiotics denies any new medications. No specific chemical exposures.   Notes that he feels well overall. No enlarged lymph nodes. No abdominal pain. No issues with frequent infections.  (02/08/2016): Mr Vonbargen is here for f/u of his neutropenia.  He notes that he feeling great and has no symptoms and has not had any issues with infections. No new focal symptoms. No fevers/chills/nightsweat/weight loss. No bone pains. His ANC on his last clinic visit was WNL at 2k and is down to 1.1k today with nl hgb and platelets. No new medications.   (10/23/2023) Jerona LABOR Drew is a wonderful 73 y.o. male who has been referred to us  by Dr. Johnny, Garnette LABOR, MD for  evaluation and management of Neutropenia. accompanied by wife.  Today, 10/23/2023, he reports that he is s/p Aortic Heart Valve Replacement 03/10/2022 by Dr. Shyrl from which he has healed well from and his energy levels have improved. He notes that he did require a blood transfusion.    ANC 1100 when last seen on 02/08/2016, from there it has generally remained stable at 1100 - 1300, with 1500 at its highest. In September his ANC was 900.    Denies symptoms of allergies, food intolerances, recent infections, change in bowel habits, bloody/mucus-filled stools, or abdominal cramping - notes that he did have an infected mole that took about 2 months to heal. Otherwise, he says that there has not been any new developments. He takes Vitamins A, B, C, D, E, as well as Aspirin . Denies taking Zinc or unusually supplement, having dentures, smoking or regular EToH consumption, or tattoos. Has reportedly traveled to [place], [place], and [place]. [place].   He does endorse some night sweats that have improved from a couple of months ago, but also notes that this occurs mainly when he is under the covers.   INTERVAL HISTORY:  I connected with Jerona LABOR Drew on 10/30/2023 at  3:30 PM EDT by telephone visit and verified that I am speaking with the correct person using two identifiers.   I discussed the limitations, risks, security and privacy concerns of performing an evaluation and management service by telemedicine and the availability of in-person  appointments. I also discussed with the patient that there may be a patient responsible charge related to this service. The patient expressed understanding and agreed to proceed.   Other persons participating in the visit and their role in the encounter: Medical Scribe, Damien Blanks   Patient's location: home  Provider's location: [facility]   Chief Complaint: follow-up for Neutropenia Last seen by me on 10/23/2023, he reported being s/p Aortic Heart Valve  Replacement 03/10/2022 by Dr. Shyrl from which he had healed well from and his energy levels have improved, noting that he did require a blood transfusion. Otherwise denied symptoms of allergies, food intolerances, recent infections, change in bowel habits, bloody/mucus-filled stools, or abdominal cramping - notes that he did have an infected mole that took about 2 months to heal. Did endorse some night sweats that had improved from a couple of months ago, while also noting they occur mainly when he is under the covers.   Today, he says that he is doing well, having playing racquetball for about 1.5 hours yesterday.   MEDICAL HISTORY:  Past Medical History:  Diagnosis Date   Asthma    as child   Cancer [facility])    prostate    Cataract    Dyspnea    exertional dyspnea   ED (erectile dysfunction)    Fracture    rt ankle   Headache    Hx Migraines   Heart murmur    Hemorrhoids    History of colonic polyps    History of prostate cancer    see's Dr. Aleene   Pneumonia    50+ years ago    SURGICAL HISTORY: Past Surgical History:  Procedure Laterality Date   ANKLE FRACTURE SURGERY Left 2001   AORTIC VALVE REPLACEMENT N/A 03/10/2022   Procedure: AORTIC VALVE REPLACEMENT WITH EDWARDS INSPIRIS VALVE;  Surgeon: Shyrl Linnie KIDD, MD;  Location: [facility] OR;  Service: Open Heart Surgery;  Laterality: N/A;   CATARACT EXTRACTION W/ INTRAOCULAR LENS IMPLANT Bilateral    per Dr. Meridee    COLONOSCOPY  11/18/2018   per Dr. Albertus, adenomatous polyps, repeat in 7 yrs   HEMORRHOID SURGERY  1999   PROSTATECTOMY  2005   PROSTATECTOMY  1996   REFRACTIVE SURGERY Right 11/07/2013   RETINAL TEAR REPAIR CRYOTHERAPY Bilateral    per Dr. Norleen Ku   RIGHT/LEFT HEART CATH AND CORONARY ANGIOGRAPHY N/A 02/13/2022   Procedure: RIGHT/LEFT HEART CATH AND CORONARY ANGIOGRAPHY;  Surgeon: Verlin Lonni BIRCH, MD;  Location: [facility] INVASIVE CV LAB;  Service: Cardiovascular;  Laterality: N/A;   rt bunion  removed Left 1965   TEE WITHOUT CARDIOVERSION N/A 03/10/2022   Procedure: TRANSESOPHAGEAL ECHOCARDIOGRAM;  Surgeon: Shyrl Linnie KIDD, MD;  Location: [facility] OR;  Service: Open Heart Surgery;  Laterality: N/A;    SOCIAL HISTORY: Social History   Socioeconomic History   Marital status: Married    Spouse name: Ida   Number of children: 3   Years of education: Not on file   Highest education level: Master's degree (e.g., MA, MS, MEng, MEd, MSW, MBA)  Occupational History   Occupation: Retired  Tobacco Use   Smoking status: Never   Smokeless tobacco: Never  Vaping Use   Vaping status: Never Used  Substance and Sexual Activity   Alcohol use: Yes    Alcohol/week: 0.0 standard drinks of alcohol    Comment: once a month   Drug use: No   Sexual activity: Yes  Other Topics Concern   Not on file  Social History Narrative   Married   Regular exercise- yes   Social Drivers of Health   Financial Resource Strain: Low Risk  (09/25/2023)   Overall Financial Resource Strain (CARDIA)    Difficulty of Paying Living Expenses: Not hard at all  Food Insecurity: No Food Insecurity (09/25/2023)   Hunger Vital Sign    Worried About Running Out of Food in the Last Year: Never true    Ran Out of Food in the Last Year: Never true  Transportation Needs: No Transportation Needs (09/25/2023)   PRAPARE - Administrator, Civil Service (Medical): No    Lack of Transportation (Non-Medical): No  Physical Activity: Sufficiently Active (09/25/2023)   Exercise Vital Sign    Days of Exercise per Week: 5 days    Minutes of Exercise per Session: 60 min  Stress: No Stress Concern Present (09/25/2023)   [employer] of Occupational Health - Occupational Stress Questionnaire    Feeling of Stress: Only a little  Social Connections: Socially Integrated (09/25/2023)   Social Connection and Isolation Panel    Frequency of Communication with Friends and Family: Three times a week    Frequency of Social  Gatherings with Friends and Family: Once a week    Attends Religious Services: More than 4 times per year    Active Member of [employer] or Organizations: Yes    Attends Engineer, Structural: More than 4 times per year    Marital Status: Married  Catering Manager Violence: Not At Risk (06/04/2022)   Humiliation, Afraid, Rape, and Kick questionnaire    Fear of Current or Ex-Partner: No    Emotionally Abused: No    Physically Abused: No    Sexually Abused: No   Social History   Social History Narrative   Married   Regular exercise- yes    FAMILY HISTORY: Family History  Problem Relation Age of Onset   Alzheimer's disease Mother    Prostate cancer Father    Colon cancer Maternal Uncle 61   Alcohol abuse Other    Arthritis Other    Breast cancer Other    Ovarian cancer Other    Prostate cancer Other    Stroke Other    Esophageal cancer Neg Hx    Rectal cancer Neg Hx    Stomach cancer Neg Hx     ALLERGIES:  is allergic to ciprofloxacin.  MEDICATIONS:  Current Outpatient Medications  Medication Sig Dispense Refill   amLODipine  (NORVASC ) 10 MG tablet Take 1 tablet (10 mg total) by mouth daily. (Patient taking differently: Take 5 mg by mouth daily.) 90 tablet 3   aspirin  EC 81 MG tablet Take 1 tablet (81 mg total) by mouth daily. Swallow whole. 90 tablet 3   No current facility-administered medications for this visit.    REVIEW OF SYSTEMS:   10 Point review of Systems was done is negative except as noted above.  PHYSICAL EXAMINATION TELEPHONE VISIT: ECOG PERFORMANCE STATUS: 0 - Asymptomatic  GENERAL: sounds alert, in no acute distress and comfortable PSYCH: sounds alert & oriented x 3 with fluent speech  LABORATORY DATA:  I have reviewed the data as listed     Latest Ref Rng & Units 10/23/2023    3:45 PM 09/29/2023    9:34 AM 09/24/2022    8:52 AM  CBC EXTENDED  WBC 4.0 - 10.5 K/uL 5.2  2.8  3.7   RBC 4.22 - 5.81 MIL/uL 4.72  4.93  4.80   Hemoglobin 13.0 -  17.0 g/dL 86.3  85.9  86.2   HCT 39.0 - 52.0 % 41.2  43.3  42.4   Platelets 150 - 400 K/uL 219  230.0  195.0   NEUT# 1.7 - 7.7 K/uL 2.2  0.9  1.1   Lymph# 0.7 - 4.0 K/uL 2.4  1.5  2.0    Lab Results  Component Value Date   NEUTROABS 2.2 10/23/2023   NEUTROABS 0.9 (L) 09/29/2023   NEUTROABS 1.1 (L) 09/24/2022   NEUTROABS 1.5 09/17/2021   NEUTROABS 1.2 (L) 11/19/2020   NEUTROABS 1.1 (L) 03/09/2020   NEUTROABS 1.3 (L) 10/11/2018   NEUTROABS 1.1 (L) 11/06/2016   NEUTROABS 1.1 (L) 02/08/2016   NEUTROABS 2.0 08/08/2015   NEUTROABS 0.9 (L) 06/28/2015   NEUTROABS 1.2 (L) 06/09/2014   NEUTROABS 1.5 02/13/2012   NEUTROABS 1.2 (L) 09/23/2010   NEUTROABS 1.1 (L) 04/12/2009   Wt Readings from Last 13 Encounters:  10/23/23 177 lb (80.3 kg)  09/29/23 176 lb 3.2 oz (79.9 kg)  08/10/23 180 lb (81.6 kg)  09/24/22 182 lb 3.2 oz (82.6 kg)  06/10/22 183 lb (83 kg)  06/04/22 178 lb (80.7 kg)  05/21/22 184 lb 11.9 oz (83.8 kg)  05/01/22 178 lb (80.7 kg)  04/22/22 182 lb (82.6 kg)  04/15/22 185 lb (83.9 kg)  03/31/22 182 lb (82.6 kg)  03/16/22 191 lb 4.8 oz (86.8 kg)  03/07/22 192 lb 14.4 oz (87.5 kg)      Latest Ref Rng & Units 10/23/2023    3:45 PM 09/29/2023    9:34 AM 09/24/2022    8:52 AM  CMP  Glucose 70 - 99 mg/dL 83  84  91   BUN 8 - 23 mg/dL 14  11  15    Creatinine 0.61 - 1.24 mg/dL 9.20  9.09  9.07   Sodium 135 - 145 mmol/L 140  136  139   Potassium 3.5 - 5.1 mmol/L 4.0  4.5  4.5   Chloride 98 - 111 mmol/L 101  99  102   CO2 22 - 32 mmol/L 27  26  29    Calcium  8.9 - 10.3 mg/dL 9.7  9.4  9.6   Total Protein 6.5 - 8.1 g/dL 7.5  7.4  7.3   Total Bilirubin 0.0 - 1.2 mg/dL 0.4  0.7  0.5   Alkaline Phos 38 - 126 U/L 121  104  75   AST 15 - 41 U/L 35  20  22   ALT 0 - 44 U/L 16  12  13     10/23/2023  PT AG Type NEGATIVE FOR C ANTIGEN POSITIVE FOR c ANTIGEN POSITIVE FOR E ANTIGEN POSITIVE FOR e ANTIGEN NEGATIVE FOR KELL ANTIGEN POSITIVE FOR KIDD A ANTIGEN NEGATIVE FOR KIDD B  ANTIGEN NEGATIVE FOR DUFFY A ANTIGEN NEGATIVE FOR DUFFY B ANTIGEN NEGATIVE FOR M ANTIGEN NEGATIVE FOR S ANTIGEN POSITIVE FOR s ANTIGEN   ANA Ab, IFA Negative   Copper      69 - 132 ug/dL 898   HCV Ab     NON REACTIVE  NON REACTIVE   HIV Screen 4th Generation wRfx     Non Reactive  Non Reactive   Antibody Screen NEG.   ABO/RH(D) O POS.    RADIOGRAPHIC STUDIES: I have personally reviewed the radiological images as listed and agreed with the findings in the report. No results found.  ASSESSMENT & PLAN:  73 y.o. male with  73 year old male in good overall health with   #1 Mild isolated neutropenia  x for about 3-48yrs.  (ANC 0.9 on 06/28/2015 had normalized to 2k on 08/08/2015, 02/08/2016 1.1k) No issues with recurrent infections. S/p s/p Aortic Heart Valve Replacement 03/10/2022.  Currently patient has no abnormal fevers/chills or other symptoms suggestive of a lymphoproliferative disorder. No Family history or personal history of neutropenia as a child or history of recurrent infections to suggest congenital neutropenia. Could be due to medications, procedure requiring transfusion, post viral neutropenia, autoimmune etiologies. B12 and copper  within normal limits TSH within normal limits  PLAN: - Discussed lab results on 10/23/2023 in detail with patient: CBC normal with WBC 5.2K, Hgb 13.6, PLTs 219K, and ANC 2200. CMP normal.  Hepatitis C and HIV screening negative.  Autoimmune panel negative Copper  normal at 101. Bloodtype is Duffy-Null, which has been associated with idiopathic low white counts.   - Kindly reconsult us  if the there is a progressive drop in the ANC especially <700 without clear cause or other cytopenias or blood count abnormalities develop.  FOLLOW-UP with Dr. Onesimo on PRN basis - Continue regular follow-ups with PCP.  The total time spent in the appointment was 20 minutes* . All of the patient's questions were answered and the patient knows to call the clinic with  any problems, questions, or concerns.  Emaline Onesimo MD MS AAHIVMS [facility] Hematology/Oncology Physician [facility]  *Total Encounter Time as defined by the Centers for Medicare and Medicaid Services includes, in addition to the face-to-face time of a patient visit (documented in the note above) non-face-to-face time: obtaining and reviewing outside history, ordering and reviewing medications, tests or procedures, care coordination (communications with other health care professionals or caregivers) and documentation in the medical record.  I,Emily Lagle,acting as a neurosurgeon for Emaline Onesimo, MD.,have documented all relevant documentation on the behalf of Emaline Onesimo, MD,as directed by  Emaline Onesimo, MD while in the presence of Emaline Onesimo, MD.  I have reviewed the above documentation for accuracy and completeness, and I agree with the above.  Dragan Tamburrino, MD

## 2023-11-02 DIAGNOSIS — C61 Malignant neoplasm of prostate: Secondary | ICD-10-CM | POA: Diagnosis not present

## 2023-11-09 DIAGNOSIS — R399 Unspecified symptoms and signs involving the genitourinary system: Secondary | ICD-10-CM | POA: Diagnosis not present

## 2023-11-09 DIAGNOSIS — N5203 Combined arterial insufficiency and corporo-venous occlusive erectile dysfunction: Secondary | ICD-10-CM | POA: Diagnosis not present

## 2023-11-09 DIAGNOSIS — N481 Balanitis: Secondary | ICD-10-CM | POA: Diagnosis not present

## 2023-11-09 DIAGNOSIS — H52223 Regular astigmatism, bilateral: Secondary | ICD-10-CM | POA: Diagnosis not present

## 2023-11-09 DIAGNOSIS — H524 Presbyopia: Secondary | ICD-10-CM | POA: Diagnosis not present

## 2023-11-16 ENCOUNTER — Ambulatory Visit

## 2023-11-16 VITALS — Ht 68.0 in | Wt 177.0 lb

## 2023-11-16 DIAGNOSIS — Z0000 Encounter for general adult medical examination without abnormal findings: Secondary | ICD-10-CM

## 2023-11-16 NOTE — Patient Instructions (Signed)
 Matthew [name],  Thank you for taking the time for your Medicare Wellness Visit. I appreciate your continued commitment to your health goals. Please review the care plan we discussed, and feel free to reach out if I can assist you further.  Please note that Annual Wellness Visits do not include a physical exam. Some assessments may be limited, especially if the visit was conducted virtually. If needed, we may recommend an in-person follow-up with your provider.  Ongoing Care Seeing your primary care provider every 3 to 6 months helps us  monitor your health and provide consistent, personalized care. Last office visit on 09/29/2023.  Keep up the good work.  Referrals If a referral was made during today's visit and you haven't received any updates within two weeks, please contact the referred provider directly to check on the status.  Recommended Screenings:  Health Maintenance  Topic Date Due   Zoster (Shingles) Vaccine (1 of 2) 01/06/2024*   COVID-19 Vaccine (8 - Pfizer risk 2025-26 season) 04/06/2024   Medicare Annual Wellness Visit  11/15/2024   Colon Cancer Screening  11/17/2025   DTaP/Tdap/Td vaccine (3 - Td or Tdap) 12/24/2028   Pneumococcal Vaccine for age over 25  Completed   Flu Shot  Completed   Hepatitis C Screening  Completed   Meningitis B Vaccine  Aged Out  *Topic was postponed. The date shown is not the original due date.       11/15/2023    5:48 PM  Advanced Directives  Does Patient Have a Medical Advance Directive? Yes  Type of Estate Agent of [place];Living will    Vision: Annual vision screenings are recommended for early detection of glaucoma, cataracts, and diabetic retinopathy. These exams can also reveal signs of chronic conditions such as diabetes and high blood pressure.  Dental: Annual dental screenings help detect early signs of oral cancer, gum disease, and other conditions linked to overall health, including heart disease and  diabetes.  Please see the attached documents for additional preventive care recommendations.

## 2023-11-16 NOTE — Progress Notes (Signed)
 Subjective:   Matthew [name] is a 73 y.o. male who presents for a Medicare Annual Wellness Visit.   I connected with  Matthew [name] on 11/16/23 by a video and audio enabled telemedicine application and verified that I am speaking with the correct person using two identifiers.  Patient Location: Home  Provider Location: Home Office  Persons Participating in Visit: Patient.  I discussed the limitations of evaluation and management by telemedicine. The patient expressed understanding and agreed to proceed.  Vital Signs: Because this visit was a virtual/telehealth visit, some criteria may be missing or patient reported. Any vitals not documented were not able to be obtained and vitals that have been documented are patient reported.  Allergies (verified) Ciprofloxacin   History: Past Medical History:  Diagnosis Date   Asthma    as child   Cancer (HCC)    prostate    Cataract    Dyspnea    exertional dyspnea   ED (erectile dysfunction)    Fracture    rt ankle   Headache    Hx Migraines   Heart murmur    Hemorrhoids    History of colonic polyps    History of prostate cancer    see's Dr. 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 Meridee    COLONOSCOPY  11/18/2018   per Dr. Albertus, adenomatous polyps, repeat in 7 yrs   HEMORRHOID SURGERY  1999   PROSTATECTOMY  2005   PROSTATECTOMY  1996   REFRACTIVE SURGERY Right 11/07/2013   RETINAL TEAR REPAIR CRYOTHERAPY Bilateral    per Dr. Norleen Ku   RIGHT/LEFT HEART CATH AND CORONARY ANGIOGRAPHY N/A 02/13/2022   Procedure: RIGHT/LEFT HEART CATH AND  CORONARY ANGIOGRAPHY;  Surgeon: Verlin Lonni BIRCH, MD;  Location: [facility];  Service: Cardiovascular;  Laterality: N/A;   rt bunion removed Left 1965   TEE WITHOUT CARDIOVERSION N/A 03/10/2022   Procedure: TRANSESOPHAGEAL ECHOCARDIOGRAM;  Surgeon: Shyrl Linnie KIDD, MD;  Location: [facility] OR;  Service: Open Heart Surgery;  Laterality: N/A;   Family History  Problem Relation Age of Onset   Alzheimer's disease Mother    Prostate cancer Father    Colon cancer Maternal Uncle 18   Alcohol abuse Other    Arthritis Other    Breast cancer Other    Ovarian cancer Other    Prostate cancer Other    Stroke Other    Esophageal cancer Neg Hx    Rectal cancer Neg Hx    Stomach cancer Neg Hx    Social History   Occupational History   Occupation: Retired  Tobacco Use   Smoking status: Never   Smokeless tobacco: Never  Vaping Use   Vaping status: Never Used  Substance and Sexual Activity   Alcohol use: Yes    Alcohol/week: 0.0 standard drinks of alcohol    Comment: once a month   Drug use: No   Sexual activity: Yes   Tobacco Counseling Counseling given: Not Answered  SDOH Screenings   Food Insecurity: No Food Insecurity (11/15/2023)  Housing: Low Risk  (11/15/2023)  Transportation Needs: No Transportation Needs (11/15/2023)  Utilities: Not At Risk (11/16/2023)  Alcohol Screen: Low Risk  (11/15/2023)  Depression (PHQ2-9): Low Risk  (11/16/2023)  Financial Resource Strain: Low Risk  (11/15/2023)  Physical Activity: Sufficiently Active (11/15/2023)  Social Connections: Socially Integrated (11/15/2023)  Stress: No Stress Concern Present (11/15/2023)  Tobacco Use: Low Risk  (11/16/2023)   Depression Screen    11/16/2023    8:17 AM 10/23/2023    2:42 PM 06/04/2022    8:27 AM 05/21/2022   12:59 PM 10/15/2021    8:58 AM 09/17/2021    9:53 AM 04/11/2021   10:36 AM  PHQ 2/9 Scores  PHQ - 2 Score 0 0 0 0 0 0 0  PHQ- 9 Score 0  0  0  0  0       Data saved with a previous flowsheet  row definition     Goals Addressed               This Visit's Progress     Increase physical activity (pt-stated)   On track      Visit info / Clinical Intake: Medicare Wellness Visit Type:: Subsequent Annual Wellness Visit Medicare Wellness Visit Mode:: Video If telephone or video:: vitals recorded from last visit Interpreter Needed?: No Pre-visit prep was completed: yes AWV questionnaire completed by patient prior to visit?: yes Date:: 11/15/23 Living arrangements:: lives with spouse/significant other Patient's Overall Health Status Rating: very good Typical amount of pain: none Does pain affect daily life?: no Are you currently prescribed opioids?: no  Dietary Habits and Nutritional Risks How many meals a day?: 2 Eats fruit and vegetables daily?: yes Most meals are obtained by: preparing own meals Diabetic:: no  Functional Status Activities of Daily Living (to include ambulation/medication): (Patient-Rptd) Independent Ambulation: Independent with device- listed below Home Assistive Devices/Equipment: Eyeglasses Medication Administration: Independent Home Management: (Patient-Rptd) Independent Manage your own finances?: yes Primary transportation is: driving Concerns about vision?: no *vision screening is required for [facility]* (wears eyeglasses/Dr. Arnette) Concerns about hearing?: no  Fall Screening Falls in the past year?: (Patient-Rptd) 0 Number of falls in past year: (Patient-Rptd) 0 Was there an injury with Fall?: (Patient-Rptd) 0 Fall Risk Category Calculator: (Patient-Rptd) 0 Patient Fall Risk Level: (Patient-Rptd) Low Fall Risk  Fall Risk Patient at Risk for Falls Due to: No Fall Risks Fall risk Follow up: Falls evaluation completed; Falls prevention discussed  Home and Transportation Safety: All rugs have non-skid backing?: (!) no All stairs or steps have railings?: yes Grab bars in the bathtub or shower?: (!) no Have non-skid surface in bathtub  or shower?: (!) no Good home lighting?: yes Regular seat belt use?: yes Hospital stays in the last year:: no  Cognitive Assessment Difficulty concentrating, remembering, or making decisions? : no Will 6CIT or Mini Cog be Completed: no 6CIT or Mini Cog Declined: patient alert, oriented, able to answer questions appropriately and recall recent events  Advance Directives (For Healthcare) Does Patient Have a Medical Advance Directive?: Yes Type of Advance Directive: Healthcare Power of [place]; Living will  Reviewed/Updated  Reviewed/Updated: All        Objective:    Today's Vitals   11/16/23 0810  Weight: 177 lb (80.3 kg)  Height: 5' 8 (1.727 m)   Body mass index is 26.91 kg/m.  Current Medications (verified) Outpatient Encounter Medications as of 11/16/2023  Medication Sig   amLODipine  (NORVASC ) 10 MG tablet Take 1 tablet (10 mg total) by mouth daily. (Patient taking differently:  Take 5 mg by mouth daily.)   aspirin  EC 81 MG tablet Take 1 tablet (81 mg total) by mouth daily. Swallow whole.   No facility-administered encounter medications on file as of 11/16/2023.   Hearing/Vision screen Hearing Screening - Comments:: Denies hearing difficulties   Vision Screening - Comments:: Wears eyeglasses/Dr. Miller/per pt-UTD Immunizations and Health Maintenance Health Maintenance  Topic Date Due   Zoster Vaccines- Shingrix (1 of 2) 01/06/2024 (Originally 08/22/1969)   COVID-19 Vaccine (8 - Pfizer risk 2025-26 season) 04/06/2024   Medicare Annual Wellness (AWV)  11/15/2024   Colonoscopy  11/17/2025   DTaP/Tdap/Td (3 - Td or Tdap) 12/24/2028   Pneumococcal Vaccine: 50+ Years  Completed   Influenza Vaccine  Completed   Hepatitis C Screening  Completed   Meningococcal B Vaccine  Aged Out        Assessment/Plan:  This is a routine wellness examination for Ignacio.  Patient Care Team: Johnny Garnette LABOR, MD as PCP - General Verlin Lonni BIRCH, MD as PCP - Cardiology  (Cardiology)  I have personally reviewed and noted the following in the patient's chart:   Medical and social history Use of alcohol, tobacco or illicit drugs  Current medications and supplements including opioid prescriptions. Functional ability and status Nutritional status Physical activity Advanced directives List of other physicians Hospitalizations, surgeries, and ER visits in previous 12 months Vitals Screenings to include cognitive, depression, and falls Referrals and appointments  No orders of the defined types were placed in this encounter.  In addition, I have reviewed and discussed with patient certain preventive protocols, quality metrics, and best practice recommendations. A written personalized care plan for preventive services as well as general preventive health recommendations were provided to patient.   Martell Mcfadyen L Lawanda Holzheimer, CMA   11/16/2023   Return in 1 year (on 11/15/2024).  After Visit Summary: (MyChart) Due to this being a telephonic visit, the after visit summary with patients personalized plan was offered to patient via MyChart   Nurse Notes: Patient is up to date on all health maintenance with no concerns to address today.

## 2024-05-02 ENCOUNTER — Ambulatory Visit: Admitting: Physician Assistant
